# Patient Record
Sex: Female | Born: 1975
Health system: Southern US, Community
[De-identification: ages and names within clinical notes are randomized; demographics above are authoritative.]

## PROBLEM LIST (undated history)

## (undated) DIAGNOSIS — D649 Anemia, unspecified: Secondary | ICD-10-CM

## (undated) DIAGNOSIS — E119 Type 2 diabetes mellitus without complications: Secondary | ICD-10-CM

## (undated) DIAGNOSIS — N83209 Unspecified ovarian cyst, unspecified side: Secondary | ICD-10-CM

## (undated) DIAGNOSIS — Z8619 Personal history of other infectious and parasitic diseases: Secondary | ICD-10-CM

## (undated) DIAGNOSIS — T7840XA Allergy, unspecified, initial encounter: Secondary | ICD-10-CM

## (undated) DIAGNOSIS — D219 Benign neoplasm of connective and other soft tissue, unspecified: Secondary | ICD-10-CM

## (undated) DIAGNOSIS — Z800 Family history of malignant neoplasm of digestive organs: Secondary | ICD-10-CM

## (undated) DIAGNOSIS — K219 Gastro-esophageal reflux disease without esophagitis: Secondary | ICD-10-CM

## (undated) HISTORY — DX: Gastro-esophageal reflux disease without esophagitis: K21.9

## (undated) HISTORY — PX: OVARIAN CYST REMOVAL: SHX89

## (undated) HISTORY — DX: Anemia, unspecified: D64.9

## (undated) HISTORY — DX: Benign neoplasm of connective and other soft tissue, unspecified: D21.9

## (undated) HISTORY — DX: Unspecified ovarian cyst, unspecified side: N83.209

## (undated) HISTORY — PX: TUBAL LIGATION: SHX77

## (undated) HISTORY — DX: Personal history of other infectious and parasitic diseases: Z86.19

## (undated) HISTORY — DX: Family history of malignant neoplasm of digestive organs: Z80.0

## (undated) HISTORY — PX: ABDOMINAL HYSTERECTOMY: SHX81

## (undated) HISTORY — DX: Type 2 diabetes mellitus without complications: E11.9

## (undated) HISTORY — PX: BREAST BIOPSY: SHX20

## (undated) HISTORY — DX: Allergy, unspecified, initial encounter: T78.40XA

---

## 2007-06-07 ENCOUNTER — Emergency Department: Payer: Self-pay | Admitting: Emergency Medicine

## 2011-08-07 ENCOUNTER — Other Ambulatory Visit: Payer: Self-pay | Admitting: Internal Medicine

## 2011-12-18 HISTORY — PX: LAPAROSCOPIC SUPRACERVICAL HYSTERECTOMY: SUR797

## 2012-05-08 ENCOUNTER — Ambulatory Visit: Payer: Self-pay | Admitting: Obstetrics and Gynecology

## 2012-05-08 LAB — CBC
HGB: 10.1 g/dL — ABNORMAL LOW (ref 12.0–16.0)
MCHC: 30.9 g/dL — ABNORMAL LOW (ref 32.0–36.0)
Platelet: 344 10*3/uL (ref 150–440)
RBC: 4.91 10*6/uL (ref 3.80–5.20)
RDW: 18.2 % — ABNORMAL HIGH (ref 11.5–14.5)

## 2012-05-08 LAB — BASIC METABOLIC PANEL
Anion Gap: 8 (ref 7–16)
BUN: 12 mg/dL (ref 7–18)
Chloride: 104 mmol/L (ref 98–107)
Creatinine: 0.74 mg/dL (ref 0.60–1.30)
EGFR (Non-African Amer.): >60
Osmolality: 274 (ref 275–301)
Potassium: 4.1 mmol/L (ref 3.5–5.1)

## 2012-05-15 ENCOUNTER — Ambulatory Visit: Payer: Self-pay | Admitting: Obstetrics and Gynecology

## 2013-09-16 ENCOUNTER — Ambulatory Visit: Payer: Self-pay | Admitting: Physician Assistant

## 2013-09-21 ENCOUNTER — Ambulatory Visit: Payer: Self-pay | Admitting: Obstetrics and Gynecology

## 2013-09-21 LAB — CBC
HCT: 37.3 % (ref 35.0–47.0)
HGB: 12 g/dL (ref 12.0–16.0)
MCH: 22.4 pg — ABNORMAL LOW (ref 26.0–34.0)
MCHC: 32.3 g/dL (ref 32.0–36.0)
MCV: 69 fL — ABNORMAL LOW (ref 80–100)
RBC: 5.37 10*6/uL — ABNORMAL HIGH (ref 3.80–5.20)
WBC: 6.2 10*3/uL (ref 3.6–11.0)

## 2013-09-22 ENCOUNTER — Inpatient Hospital Stay: Payer: Self-pay | Admitting: Obstetrics and Gynecology

## 2013-09-22 DIAGNOSIS — N809 Endometriosis, unspecified: Secondary | ICD-10-CM

## 2013-09-22 DIAGNOSIS — K631 Perforation of intestine (nontraumatic): Secondary | ICD-10-CM

## 2013-09-23 ENCOUNTER — Encounter: Payer: Self-pay | Admitting: General Surgery

## 2013-09-23 LAB — CBC WITH DIFFERENTIAL/PLATELET
HGB: 10.2 g/dL — ABNORMAL LOW (ref 12.0–16.0)
Lymphocyte %: 22.4 %
MCH: 22.7 pg — ABNORMAL LOW (ref 26.0–34.0)
MCHC: 32.6 g/dL (ref 32.0–36.0)
Monocyte %: 9.3 %
RDW: 15.4 % — ABNORMAL HIGH (ref 11.5–14.5)
WBC: 12.7 10*3/uL — ABNORMAL HIGH (ref 3.6–11.0)

## 2014-10-05 LAB — HM PAP SMEAR: HM PAP: NORMAL

## 2014-10-30 ENCOUNTER — Emergency Department: Payer: Self-pay | Admitting: Emergency Medicine

## 2014-10-30 LAB — URINALYSIS, COMPLETE
BACTERIA: NONE SEEN
Bilirubin,UR: NEGATIVE
Glucose,UR: NEGATIVE mg/dL (ref 0–75)
Ketone: NEGATIVE
Leukocyte Esterase: NEGATIVE
Nitrite: NEGATIVE
Ph: 7 (ref 4.5–8.0)
Protein: NEGATIVE
RBC,UR: 21 /HPF (ref 0–5)
Specific Gravity: 1.014 (ref 1.003–1.030)
Squamous Epithelial: <1
WBC UR: 1 /HPF (ref 0–5)

## 2014-10-30 LAB — CBC WITH DIFFERENTIAL/PLATELET
Basophil #: 0.1 10*3/uL (ref 0.0–0.1)
Basophil %: 0.8 %
EOS PCT: 1.7 %
Eosinophil #: 0.1 10*3/uL (ref 0.0–0.7)
HCT: 37.2 % (ref 35.0–47.0)
HGB: 11.6 g/dL — ABNORMAL LOW (ref 12.0–16.0)
LYMPHS ABS: 2.6 10*3/uL (ref 1.0–3.6)
LYMPHS PCT: 38.4 %
MCH: 22.4 pg — ABNORMAL LOW (ref 26.0–34.0)
MCHC: 31.1 g/dL — ABNORMAL LOW (ref 32.0–36.0)
MCV: 72 fL — ABNORMAL LOW (ref 80–100)
Monocyte #: 0.8 x10 3/mm (ref 0.2–0.9)
Monocyte %: 12.1 %
NEUTROS ABS: 3.2 10*3/uL (ref 1.4–6.5)
NEUTROS PCT: 47 %
PLATELETS: 344 10*3/uL (ref 150–440)
RBC: 5.16 10*6/uL (ref 3.80–5.20)
RDW: 15.5 % — AB (ref 11.5–14.5)
WBC: 6.9 10*3/uL (ref 3.6–11.0)

## 2014-10-30 LAB — COMPREHENSIVE METABOLIC PANEL
ALBUMIN: 3.3 g/dL — AB (ref 3.4–5.0)
Alkaline Phosphatase: 86 U/L
Anion Gap: 6 — ABNORMAL LOW (ref 7–16)
BUN: 16 mg/dL (ref 7–18)
Bilirubin,Total: 0.2 mg/dL (ref 0.2–1.0)
CALCIUM: 8 mg/dL — AB (ref 8.5–10.1)
CHLORIDE: 107 mmol/L (ref 98–107)
CO2: 28 mmol/L (ref 21–32)
CREATININE: 1.17 mg/dL (ref 0.60–1.30)
EGFR (African American): >60
EGFR (Non-African Amer.): 54 — ABNORMAL LOW
GLUCOSE: 83 mg/dL (ref 65–99)
Osmolality: 282 (ref 275–301)
POTASSIUM: 4.5 mmol/L (ref 3.5–5.1)
SGOT(AST): 26 U/L (ref 15–37)
SGPT (ALT): 25 U/L
Sodium: 141 mmol/L (ref 136–145)
Total Protein: 6.8 g/dL (ref 6.4–8.2)

## 2015-01-19 ENCOUNTER — Ambulatory Visit: Payer: Self-pay | Admitting: Nurse Practitioner

## 2015-01-28 ENCOUNTER — Ambulatory Visit: Payer: Self-pay | Admitting: Nurse Practitioner

## 2015-02-15 ENCOUNTER — Ambulatory Visit
Admit: 2015-02-15 | Disposition: A | Discharge status: Home / Self Care | Payer: Self-pay | Attending: Nurse Practitioner | Admitting: Nurse Practitioner

## 2015-03-16 ENCOUNTER — Ambulatory Visit: Payer: Self-pay

## 2015-03-18 ENCOUNTER — Ambulatory Visit
Admit: 2015-03-18 | Disposition: A | Discharge status: Home / Self Care | Payer: Self-pay | Attending: Nurse Practitioner | Admitting: Nurse Practitioner

## 2015-03-25 ENCOUNTER — Other Ambulatory Visit: Payer: Self-pay

## 2015-03-30 ENCOUNTER — Other Ambulatory Visit: Payer: Self-pay

## 2015-03-30 VITALS — BP 124/76 | HR 67 | Ht 66.0 in | Wt 233.4 lb

## 2015-03-30 DIAGNOSIS — E119 Type 2 diabetes mellitus without complications: Secondary | ICD-10-CM | POA: Insufficient documentation

## 2015-03-30 DIAGNOSIS — R7303 Prediabetes: Secondary | ICD-10-CM | POA: Insufficient documentation

## 2015-03-30 NOTE — Patient Instructions (Addendum)
  Given Link to [employer] education packet for diabetes. 1. Plan to eat 30-45 GM (2-3) servings of carbohydrate a meal and 15 GM for snacks. 2. Plan to check blood sugar 3-4 times a week either fasting or 1 -2hrs after a meal.  Goals of 80-130 fasting and 180 or less 1-2 hours after eating. 3. Plan to go to gym 3 days a week for 45 minutes.  Goal of 150 minutes a week. 4. Plan to complete EMMI programs by 04/30/15 5. Plan to return to Link to Wellness on 06/29/15 at 9:30AM

## 2015-03-30 NOTE — Patient Outreach (Signed)
[place] [facility]) Care Management   03/30/2015  Alejandra Watkins [birth year] [id number]  Alejandra Watkins is an 39 y.o. female. Member seen for initial office visit for Link to Wellness program for self management of Type 2 diabetes  Subjective: Member states that she learned she has diabetes in November.  States that she was feeling tired and having sweats so she saw her provider who checked her hemoglobin A1C which was 6.5.  States that she has made changes in how she eats and she is no longer drinking sweet tea. States she learned a lot at the [employer].  States that her hemoglobin A1C is now down to 5.8 and she is to see her provider in June.  Objective:   Review of Systems  All other systems reviewed and are negative.   Physical Exam  Filed Vitals:   03/30/15 0949  BP: 124/76  Pulse: 67   Filed Weights   03/30/15 0949  Weight: 233 lb 6.4 oz (105.87 kg)   Current Medications:   Current Outpatient Prescriptions  Medication Sig Dispense Refill  . metFORMIN (GLUCOPHAGE) 500 MG tablet Take 500 mg by mouth daily.     No current facility-administered medications for this visit.    Functional Status:   In your present state of health, do you have any difficulty performing the following activities: 03/30/2015  Hearing? N  Vision? N  Difficulty concentrating or making decisions? N  Walking or climbing stairs? N  Dressing or bathing? N  Doing errands, shopping? N    Fall/Depression Screening:    PHQ 2/9 Scores 03/30/2015  PHQ - 2 Score 0   THN CM Care Plan        Patient Outreach from 03/30/2015 in [place] Problem One  Potential for elevated blood sugars related to dx of diabetes   Care Plan for Problem One  Active   Interventions for Problem One Long Term Goal  Instructed on CHO counting and portion control, Instructed on blood sugar goals and how to evaluate her results to identify foods that  elevate  readings,  Instructed on importance of regular exercise for glycemic control   THN Long Term Goal (31-90 days)  Member will maintain hemoglobin A1C at or below 6.5 for the next 90 days   THN Long Term Goal Start Date  03/30/15   THN CM Short Term Goal #1 (0-30 days)  Member to complete EMMI programs by 04/30/15   [facility] CM Short Term Goal #1 Start Date  03/30/15      Assessment:  Member has made improvements in diet and lifestyle changes since she was diagnosed with  Diabetes. Her hemoglobin A1C has gone from 6.5 to 5.8. She is no longer drinking sweet tea.    Plan: Plan to eat 30-45 GM (2-3) servings of carbohydrate a meal and 15 GM for snacks. Plan to check blood sugar 3-4 times a week either fasting or 1 -2hrs after a meal.  Goals of 80-130 fasting and 180 or less 1-2 hours after eating. Plan to go to gym 3 days a week for 45 minutes.  Goal of 150 minutes a week. Plan to complete EMMI programs by 04/30/15    Plan to return to Link to Wellness on 06/29/15 at [place], [place] Management [phone]

## 2015-04-08 NOTE — Op Note (Signed)
PATIENT NAME:  Alejandra, Watkins MR#:  [id number] DATE OF BIRTH:  September 26, 1976  DATE OF PROCEDURE:  09/22/2013  PREOPERATIVE DIAGNOSIS: Large complex ovarian mass, sudden onset causing severe pain.   POSTOPERATIVE DIAGNOSES: Hemorrhagic corpus luteum with solid and cystic components as well as mucinous component, incidental enterotomy, severe bowel adhesions of the bowel to the ovary side wall and to the lower pelvis.   PROCEDURES: Diagnostic laparoscopy, left partial oophorectomy, lysis of severe bowel adhesions, converted to open minilaparotomy due to severe bowel adhesions and need to run the bowel. Incidental enterotomy found at the time of bowel running.   Dr. Bary Castilla called in to assist. He repaired the enterotomy with staples and was intraoperative consult.   ESTIMATED BLOOD LOSS: Approximately 50 mL.   FINDINGS: Severe bowel adhesions to the left ovary and large left ovarian cyst. Normal right ovary.   DESCRIPTION OF PROCEDURE: The patient was taken to the operating room and placed in supine position. After adequate general endotracheal anesthesia was instilled, the patient was prepped and draped in the usual sterile fashion. A side-opening speculum was placed in the patient's vagina and the anterior lip of the cervix was grasped with a single-tooth tenaculum. The umbilicus was injected with Marcaine and incision was made. Veress needle was placed, and drop test, fluid instillation test, and fluid aspiration test showed proper placement.. A 5 mm trocar port was placed in the midline and on the left lower quadrant. Another 5 mm trocar port was placed in the right lower quadrant to hold back the bowels. The aforementioned findings were seen and sharp and blunt dissection were used to remove the bowel adhesions from the ovary.   At this time it was noted that the bowels that were adhered to the posterior cul-de-sac had some denuded serosa.   At this time, Dr. Bary Castilla was called to come assist and  run the bowel.   The trocars were removed and a minilaparotomy was made approximately 2 fingerbreadths to the symphysis and carried sharply down to the fascia. The fascia was nicked in the midline. The incision was extended in a superolateral manner. Curved Mayo scissors were used for this. Kocher clamps were placed on the rectus fascia. The rectus fascia and rectus muscles were sharply and bluntly dissected off of each other.  The underlying muscle bellies were identified and the muscle bellies were split. Peritoneum was grasped and entered. The patient was placed in Trendelenburg. Balfour was placed into the abdomen. The bowels were packed back and the bowels were run. At this time the enterotomy, which had been a serosal tear, opened on the outside of the body without any spillage. This was then repaired by Dr. Bary Castilla, and he will dictate this under separate cover.   Arista was placed in the pelvis for the raw edges. The bowels were allowed to fall back into the pelvis. The muscle bellies were approximated with a running Vicryl suture. The On-Q trocars were placed. On-Q pump was started. Catheters were placed. Dermabond was placed. The fascia was closed with 2 running Vicryl sutures, 4-0 Monocryl was used to close the skin incisions. Dermabond and benzoin were placed. The bandages were placed. Clear urine was noted in the Foley bag and the patient was taken to recovery after having tolerated the procedure well.     ____________________________ Delsa Sale, MD cck:np D: 09/25/2013 19:10:00 ET T: 09/25/2013 22:45:06 ET JOB#: [id number]  cc: Delsa Sale, MD, <Dictator> Delsa Sale MD ELECTRONICALLY SIGNED 10/01/2013 11:08

## 2015-04-08 NOTE — Op Note (Signed)
PATIENT NAME:  Alejandra Watkins, Alejandra Watkins MR#:  [id number] DATE OF BIRTH:  [birth year]  DATE OF PROCEDURE:  09/22/2013  PREOPERATIVE DIAGNOSIS: Colonic injury during laparoscopic endometrioma excision.   POSTOPERATIVE DIAGNOSIS: Colonic injury during laparoscopic endometrioma excision.   OPERATIVE PROCEDURE: Repair of sigmoid colon injury.   SURGEON: Hervey Ard, MD   ASSISTANT: Erik Obey, MD; Donzetta Matters, MD   ESTIMATED BLOOD LOSS: Minimal.   ANESTHESIA: General.   CLINICAL NOTE: This 39 year old woman had previously undergone a laparoscopic supracervical hysterectomy last year. She developed pelvic pain AND was found to have a complex mass in the left side of the pelvis. She is undergoing laparoscopic resection and there was concern for a possible colonic injury. An open Pfannenstiel incision was made and general surgical consultation requested.   The area was explored and there was found to be a small 4 mm single hole in the midportion of the transverse colon. This was clamped with a Babcock clamp while the proximal and distal bowel was examined. Down to the peritoneal reflection there was no evidence of other bowel injury. There was moderate distortion of the lateral mesentery, but no active bleeding. A remnant of the left tube and ovary was subsequently resected by Dr. Cristino Martes. The area was irrigated and after assuring no additional injuries the colonic injury was repaired with a single application of a VZ-48 stapler with 3.5 mm staples. This area was then reinforced with a running 3-0 Vicryl seromuscular stitch. The wound was fortunately on the tinea. There was a serosal tear slightly distal to this and this was repaired with a running 3-0 Vicryl. No evidence of mucosal injury at this site. The area was irrigated and good hemostasis appreciated. The remaining procedure will be dictated by Dr. Cristino Martes.  ____________________________ Robert Bellow, MD jwb:sb D: 09/22/2013 14:12:10  ET T: 09/22/2013 14:34:20 ET JOB#: [id number]  cc: Robert Bellow, MD, <Dictator> Delsa Sale, MD Dillian Feig Amedeo Kinsman MD ELECTRONICALLY SIGNED 09/23/2013 8:14

## 2015-04-10 NOTE — Op Note (Signed)
PATIENT NAME:  Alejandra Watkins, Alejandra Watkins MR#:  [id number] DATE OF BIRTH:  [birth year]  DATE OF PROCEDURE:  05/15/2012  PREOPERATIVE DIAGNOSIS: Menorrhagia, large fibroids, and back pain unresponsive to medical management.   POSTOPERATIVE DIAGNOSIS:  Menorrhagia, large fibroids, and back pain unresponsive to medical management.    PROCEDURES PERFORMED: Laparoscopic supracervical hysterectomy with bilateral salpingectomy with uterus weighing greater than 250 grams.   SURGEON: Delsa Sale, M.D.   ASSISTANT: Barnett Applebaum, M.D.   ANESTHESIA: General endotracheal anesthesia and local.   FINDINGS: Large fibroids with lateral growing into the sidewalls of the uterus obscuring the uterine artery and the ureters with good hemostasis and morcellating of the uterus.   DESCRIPTION OF PROCEDURE: The patient was taken to the Operating Room and placed in the supine position. After adequate general endotracheal anesthesia was instilled, the patient was prepped and draped in the usual sterile fashion and placed in [place]. Time out was performed. A side-opening speculum was placed in the patient's vagina, a Foley catheter was placed, and the anterior lip of the cervix was grasped with a single-tooth tenaculum. The Hulka tenaculum, single-tooth, was placed into the uterus for manipulation. The umbilicus was injected with Marcaine. An incision was made in the umbilical fold. A Veress needle was placed. Hanging drop test, fluid instillation test, and fluid aspiration test showed proper placement of the Veress needle. CO2 was placed on low flow then placed on high flow. The Veress needle was removed and the Xcel trocar was placed through the midline. The patient was placed in Trendelenburg. The aforementioned findings were seen. Attention was first turned to the right side. The round ligament was cauterized and the tube was removed. Serial bites were taken down the sides of the right uterus. Bladder flap was created with  Harmonic scalpel. In a similar fashion, the same thing was done on the left side; however, there were found to be multiple adhesions of the fibroid to the sidewall obscuring mobility and the tissue of the ureters were identified and followed down into the pelvis to be sure they were not entrapped. Serial bites were then taken down the right side of the uterus and the Harmonic scalpel was then used to cut across the uterocervical isthmic. Because of the fibroid in the lower uterine segment it was very difficult to get to the exact placement that needed to be with the uterosacral ligaments so the fibroma was removed right below the level of the uterine segment but not impinging upon the cervix (inaudible). The uterine arteries were clamped and cauterized. A bladder flap was created. The two trocar ports were then placed in the lower abdomen and found to be hemostatic The one on the lower left hand side was increased to accommodate the morcellator. The morcellator was attached to the morcellator handpiece and the uterus was morcellated. There was one fibroid that was found to have a liquified center that was identified. The tubes were also passed off. The uterus pieces were then removed. The abdomen was cleared with normal saline, copious amounts. The patient was found to have very raw pelvis due to the laterality of the fibroids. Arista was placed along the blood vessels as well as some Surgiflo with Interceed to decrease the adhesion capacity. The patient was then noted to have blue urine as indigo carmine had been given. Cystoscopy was then performed to be sure the ureters were patent. Both ureters showed flow from the  of the ureters bilaterally into mthe) in the bladder. Cystoscopy  was halted. Attention was turned to the umbilicus where a deep UR-6 suture was placed, as well as in the other two incisions. The trocar on the left was removed. Morcellator removed and the fascial closing device was used to approximate  the fascia and subcuticular sutures were placed with 4-0 Monocryl. Dermabond and Steri-Strips were placed over this. Blue urine was noted in the patient's Foley bag. The patient was very stable and was then taken back to recovery after having tolerated the procedure well. ____________________________ Delsa Sale, MD cck:slb D: 05/19/2012 00:59:42 ET T: 05/19/2012 10:12:17 ET JOB#: [id number]  cc: Delsa Sale, MD, <Dictator> Delsa Sale MD ELECTRONICALLY SIGNED 05/19/2012 13:30

## 2015-06-21 ENCOUNTER — Telehealth: Payer: Self-pay

## 2015-06-29 ENCOUNTER — Ambulatory Visit: Payer: 59

## 2015-07-20 ENCOUNTER — Other Ambulatory Visit: Payer: Self-pay

## 2015-07-20 VITALS — BP 110/62 | HR 80 | Resp 16 | Ht 66.0 in | Wt 218.2 lb

## 2015-07-20 DIAGNOSIS — E119 Type 2 diabetes mellitus without complications: Secondary | ICD-10-CM

## 2015-07-20 NOTE — Patient Instructions (Signed)
1. Plan to continue to follow low carbohydrate diet 2. Plan to check blood sugar 2-3 times a week either fasting or 1 -2hrs after a meal.  Goals of 80-110 fasting and 140 or less 1-2 hours after eating. 3. Plan to go to gym 3 days a week for 60 minutes.  Goal of 150 minutes a week. 4. Plan to complete EMMI programs by 09/19/15 5. Plan to return to Link to Wellness on 10/26/15 at 9:00AM

## 2015-07-20 NOTE — Patient Outreach (Signed)
[place] [facility]) Care Management   07/20/2015  Alejandra Watkins November 01, 1976 [id number]  Alejandra Watkins is an 39 y.o. female  Member seen for follow up office visit for Link to Wellness program for self management of Type 2 diabetes  Subjective: Member states that she has been following a very low CHO diet and she has lost 20 lbs since she was diagnosed with diabetes.  States she saw her provider in May and he told her she could check her blood sugars a few times a week now.  States she is checking 2-3 times a week.  States she has been going to the gym 2-3 times a week.    Objective:   Review of Systems  All other systems reviewed and are negative. Reviewed glucometer 14 day average- 99   Physical Exam  Today's Vitals   07/20/15 0906  BP: 110/62  Pulse: 80  Resp: 16  Height: 1.676 m (5\' 6" )  Weight: 218 lb 3.2 oz (98.975 kg)  SpO2: 97%  PainSc: 0-No pain    Current Medications:   Current Outpatient Prescriptions  Medication Sig Dispense Refill  . metFORMIN (GLUCOPHAGE) 500 MG tablet Take 500 mg by mouth daily.     No current facility-administered medications for this visit.    Functional Status:   In your present state of health, do you have any difficulty performing the following activities: 07/20/2015 03/30/2015  Hearing? N N  Vision? N N  Difficulty concentrating or making decisions? - N  Walking or climbing stairs? N N  Dressing or bathing? - N  Doing errands, shopping? N N    Fall/Depression Screening:    PHQ 2/9 Scores 07/20/2015 03/30/2015  PHQ - 2 Score 0 0   THN CM Care Plan Problem One        Patient Outreach from 07/20/2015 in [place] Problem One  Potential for elevated blood sugars related to dx of diabetes   Care Plan for Problem One  Active   THN Long Term Goal (31-90 days)  Member will maintain hemoglobin A1C at or below 6.5 for the next 90 days   THN Long Term Goal Start Date  07/20/15 Alejandra Watkins has maintained  hemoglobin A1C below 6.5 last-6.2]   Interventions for Problem One Long Term Goal  Reviewed  CHO counting and portion control, Reinforced to check blood sugars regularly, Praised for weight loss and encouraged to continue to lose weight,  Reinforced on importance of regular exercise for glycemic control   THN CM Short Term Goal #1 (0-30 days)  Member to complete EMMI programs by next visit   THN CM Short Term Goal #1 Start Date  07/20/15 [Did not complete and reassigned programs]   Interventions for Short Term Goal #1  Reinforced on how to access EMMI programs      Assessment:   Member seen for follow up office visit for Link to Wellness program for self management of Type 2 diabetes.  Member has lost 15 lbs since last visit.  She is following a very low CHO diet.  She is meeting goal of maintaining hemoglobin A1C below 6.5 with last reading of 6.2 in May.  Plan:  Plan to continue to follow low carbohydrate diet Plan to check blood sugar 2-3 times a week either fasting or 1 -2hrs after a meal.  Goals of 80-110 fasting and 140 or less 1-2 hours after eating. Plan to go to gym 3 days a week for  60 minutes.  Goal of 150 minutes a week. Plan to complete EMMI programs by 09/19/15 Plan to return to Link to Wellness on 10/26/15 Peter Garter RN, [facility] Care Management Coordinator-Link to [place] Management [phone]

## 2015-10-26 ENCOUNTER — Other Ambulatory Visit: Payer: Self-pay

## 2015-10-26 VITALS — BP 112/62 | HR 70 | Resp 14 | Ht 66.0 in | Wt 219.9 lb

## 2015-10-26 DIAGNOSIS — E119 Type 2 diabetes mellitus without complications: Secondary | ICD-10-CM

## 2015-10-26 NOTE — Patient Outreach (Signed)
[place] [facility]) Care Management   10/26/2015  SONDI DESCH [birth year] [id number]  Alejandra Watkins is an 39 y.o. female.   Member seen for follow up office visit for Link to Wellness program for self management of Type 2 diabetes  Subjective: Member states that she had been trying to watch her CHO.  States that her hemoglobin A1C was 6 when she saw her MD in July.  States that she is exercising 3-4 times a week by walking 2-3 miles.  States she had an episode where she felt that her blood sugar was low one day when she was walking.  States that she ate and she felt better.   Objective:   Review of Systems  All other systems reviewed and are negative. Member did not bring glucometer for review  Physical Exam  Today's Vitals   10/26/15 0906  BP: 112/62  Pulse: 70  Resp: 14  Height: 1.676 m (_0 )  Weight: 219 lb 14.4 oz (99.746 kg)  SpO2: 98%  PainSc: 0-No pain    Current Medications:   Current Outpatient Prescriptions  Medication Sig Dispense Refill  . metFORMIN (GLUCOPHAGE) 500 MG tablet Take 500 mg by mouth daily.     No current facility-administered medications for this visit.    Functional Status:   In your present state of health, do you have any difficulty performing the following activities: 10/26/2015 07/20/2015  Hearing? N N  Vision? N N  Difficulty concentrating or making decisions? N -  Walking or climbing stairs? N N  Dressing or bathing? N -  Doing errands, shopping? N N    Fall/Depression Screening:    PHQ 2/9 Scores 10/26/2015 07/20/2015 03/30/2015  PHQ - 2 Score 0 0 0    Assessment:   Member seen for follow up office visit for Link to Wellness program for self management of Type 2 diabetes.  Member continues to maintain diabetes self management goal of hemoglobin A1C of 6.5 or less with last reading 6.  Member is adhering to a low CHO diet and is exercising regularly.  She did have one episode of reported hypoglycemia after exercise but she did  not check her CBG at the time.  Plan:  1. Plan to continue to follow low carbohydrate diet.  Plan to eat a snack before exercising in the afternoon 2. Plan to check blood sugar 2-3 times a week either fasting or 1 -2hrs after a meal.  Goals of 80-110 fasting and 140 or less 1-2 hours after eating. 3. Plan to go to gym or walk 3 days a week for 60 minutes. Plan to do strength training twice Goal of 150 minutes a week. 4. Plan to see Dr. Meredith Staggers on 02/09/16 5. Plan to return to Link to Wellness on 03/07/16 at [place] Problem One        Most Recent Value   Care Plan Problem One  Potential for elevated blood sugars related to dx of diabetes   Role Documenting the Problem One  Care Management [place] for Problem One  Active   THN Long Term Goal (31-90 days)  Member will maintain hemoglobin A1C at or below 6.5 for the next 90 days   THN Long Term Goal Start Date  10/26/15 Maylon Peppers has maintained hemoglobin A1C below 6.5 last-6]   Interventions for Problem One Long Term Goal  Reviewed  CHO counting and portion control, Reinforced to check blood sugars regularly and  if she feels low, instructed to eat a snack before she exercises in the afternoon, Encouraged to continue to lose weight,  Reinforced on importance of regular exercise for glycemic control   THN CM Short Term Goal #1 (0-30 days)  Member to complete EMMI programs by next visit   [facility] CM Short Term Goal #1 Start Date  07/20/15 [Did not complete and reassigned programs]   [facility] CM Short Term Goal #1 Met Date  10/26/15    Peter Garter RN, [facility] Care Management Coordinator-Link to [place] Management [phone]

## 2015-10-26 NOTE — Patient Instructions (Addendum)
1. Plan to continue to follow low carbohydrate diet.  Plan to eat a snack before exercising in the afternoon 2. Plan to check blood sugar 2-3 times a week either fasting or 1 -2hrs after a meal.  Goals of 80-110 fasting and 140 or less 1-2 hours after eating. 3. Plan to go to gym or walk 3 days a week for 60 minutes. Plan to do strength training twice Goal of 150 minutes a week. 4. Plan to see Dr. Meredith Staggers on 02/09/16 5. Plan to return to Link to Wellness on 03/07/16 at 9:00AM

## 2016-02-22 ENCOUNTER — Other Ambulatory Visit: Payer: Self-pay

## 2016-02-22 VITALS — BP 112/70 | HR 73 | Resp 16 | Ht 66.0 in | Wt 229.0 lb

## 2016-02-22 DIAGNOSIS — E119 Type 2 diabetes mellitus without complications: Secondary | ICD-10-CM

## 2016-02-22 NOTE — Patient Instructions (Signed)
1. Plan to continue to watch amount of carbohydrate in meals.  2. Plan to check blood sugar weekly a week either fasting or 1 -2hrs after a meal.  Goals of 80-110 fasting and 140 or less 1 1/2-2 hours after eating. 3. Plan to go to gym or walk 3 days a week for 60 minutes. Plan to do strength training twice Goal of 150 minutes a week. 4. Plan to see Dr. Meredith Staggers on 02/29/16 5. Plan to return to Link to Wellness on 05/23/16 at 9:00AM

## 2016-02-22 NOTE — Patient Outreach (Signed)
[facility]) Care Management   02/22/2016  Alejandra Watkins 22-Mar-1976 [id number]  Alejandra Watkins is an 40 y.o. female.   Member seen for follow up office visit for Link to Wellness program for self management of Type 2 diabetes  Subjective: Member states that she gained weight over the holidays but she is now trying to watch her diet better.  States she is going to the gym 3 times a week and works out with a Clinical research associate for 50 minutes.  States she is to see her MD on 02/29/16.    Objective:   Review of Systems  All other systems reviewed and are negative.   Physical Exam  Today's Vitals   02/22/16 0913  BP: 112/70  Pulse: 73  Resp: 16  Height: 1.676 m (5\' 6" )  Weight: 229 lb (103.874 kg)  SpO2: 98%  PainSc: 0-No pain   Current Medications:   Current Outpatient Prescriptions  Medication Sig Dispense Refill  . metFORMIN (GLUCOPHAGE) 500 MG tablet Take 500 mg by mouth daily.     No current facility-administered medications for this visit.    Functional Status:   In your present state of health, do you have any difficulty performing the following activities: 02/22/2016 10/26/2015  Hearing? N N  Vision? N N  Difficulty concentrating or making decisions? N N  Walking or climbing stairs? N N  Dressing or bathing? N N  Doing errands, shopping? N N    Fall/Depression Screening:    PHQ 2/9 Scores 02/22/2016 10/26/2015 07/20/2015 03/30/2015  PHQ - 2 Score 0 0 0 0    Assessment:  Member seen for follow up office visit for Link to Wellness program for self management of Type 2 diabetes. Member continues to maintain diabetes self management goal of hemoglobin A1C of 6.5 or less with last reading 6.Member to see MD on 02/29/16.   Member has gained 10 lbs since last visit.  Reports she is now exercising and watching her diet better.  Plan:  Plan to continue to watch amount of carbohydrate in meals.  Plan to check blood sugar weekly a week either fasting or 1 -2hrs after a  meal.  Goals of 80-110 fasting and 140 or less 1 1/2-2 hours after eating. Plan to go to gym or walk 3 days a week for 60 minutes. Plan to do strength training twice Goal of 150 minutes a week. Plan to see Dr. Meredith Staggers on 02/29/16 Plan to return to Link to Wellness on 05/23/16 at [place] Problem One        Most Recent Value   Care Plan Problem One  Potential for elevated blood sugars related to dx of diabetes   Role Documenting the Problem One  Care Management [place] for Problem One  Active   [facility] Long Term Goal (31-90 days)  Member will maintain hemoglobin A1C at or below 6.5 for the next 90 days   [facility] Long Term Goal Start Date  02/22/16 [Hemoglobin A1C to be rechecked 02/29/16]   Interventions for Problem One Long Term Goal  Reviewed  CHO counting and portion control,Discussed using the plate method, Encouraged to try taking her blood sugars at least once a week fasting or after meals,  Encouraged to resume losing weight,  Reinforced on importance of regular exercise for glycemic control    Peter Garter RN, [facility] Care Management Coordinator-Link to [place] Management [phone]

## 2016-02-29 DIAGNOSIS — E119 Type 2 diabetes mellitus without complications: Secondary | ICD-10-CM | POA: Diagnosis not present

## 2016-02-29 DIAGNOSIS — Z6839 Body mass index (BMI) 39.0-39.9, adult: Secondary | ICD-10-CM | POA: Diagnosis not present

## 2016-04-19 DIAGNOSIS — Z1231 Encounter for screening mammogram for malignant neoplasm of breast: Secondary | ICD-10-CM | POA: Diagnosis not present

## 2016-04-19 DIAGNOSIS — Z01419 Encounter for gynecological examination (general) (routine) without abnormal findings: Secondary | ICD-10-CM | POA: Diagnosis not present

## 2016-04-19 DIAGNOSIS — Z800 Family history of malignant neoplasm of digestive organs: Secondary | ICD-10-CM | POA: Diagnosis not present

## 2016-04-19 DIAGNOSIS — Z1239 Encounter for other screening for malignant neoplasm of breast: Secondary | ICD-10-CM | POA: Diagnosis not present

## 2016-04-19 LAB — HM MAMMOGRAPHY

## 2016-05-23 ENCOUNTER — Other Ambulatory Visit: Payer: Self-pay

## 2016-05-23 VITALS — BP 112/72 | HR 70 | Resp 16 | Ht 66.0 in | Wt 229.4 lb

## 2016-05-23 DIAGNOSIS — E119 Type 2 diabetes mellitus without complications: Secondary | ICD-10-CM

## 2016-05-23 NOTE — Patient Instructions (Signed)
1. Plan to start following Weight Watchers plan 2. Plan to check blood sugar 2-3 a week either fasting or 1 -2hrs after a meal.  Goals of 80-110 fasting and 140 or less 1 1/2-2 hours after eating. 3. Plan to go to gym or walk 3 days a week for 60 minutes. Plan to do strength training twice a week Goal of 150 minutes a week.  Plan to get 6000 steps a day on days you do not go to the gym 4. Plan to return to Link to Wellness on 08/22/16 at 9:00AM

## 2016-05-23 NOTE — Patient Outreach (Signed)
[facility]) Care Management   05/23/2016  Alejandra Watkins Dec 16, 1976 [id number]  Alejandra Watkins is an 40 y.o. female.   Member seen for follow up office visit for Link to Wellness program for self management of Type 2 diabetes  Subjective: Member states that she saw her provider in March and her hemoglobin A1C was 5.8.  States she has been checking her blood sugars 2-3 times a week.  States that she continues to go to the gym 3 times a week for 60 minutes.  States that she has joined [employer] but has not started following the plan yet.  States she plans to start when she gets back from vacation next week.     Objective:   Review of Systems  All other systems reviewed and are negative.   Physical Exam Today's Vitals   05/23/16 0907  BP: 112/72  Pulse: 70  Resp: 16  Height: 1.676 m (5\' 6" )  Weight: 229 lb 6.4 oz (104.055 kg)  SpO2: 98%  PainSc: 0-No pain   Encounter Medications:   Outpatient Encounter Prescriptions as of 05/23/2016  Medication Sig  . Cholecalciferol (VITAMIN D3) 5000 units CAPS Take 1 capsule by mouth daily.  . metFORMIN (GLUCOPHAGE) 500 MG tablet Take 500 mg by mouth daily.  . Multiple Vitamins-Minerals (MULTIVITAMIN WOMEN) TABS Take 1 tablet by mouth daily.   No facility-administered encounter medications on file as of 05/23/2016.    Functional Status:   In your present state of health, do you have any difficulty performing the following activities: 05/23/2016 02/22/2016  Hearing? N N  Vision? N N  Difficulty concentrating or making decisions? N N  Walking or climbing stairs? N N  Dressing or bathing? N N  Doing errands, shopping? - N    Fall/Depression Screening:    PHQ 2/9 Scores 05/23/2016 02/22/2016 10/26/2015 07/20/2015 03/30/2015  PHQ - 2 Score 0 0 0 0 0    Assessment:  Member seen for follow up office visit for Link to Wellness program for self management of Type 2 diabetes. Member continues to maintain diabetes self  management goal of hemoglobin A1C of 6.5% or less with last reading 5.8%. Member has joined [employer] but has not started program yet.  Reports checking her blood sugars 2-3 times a week with readings of 83-130.  Reports exercising 3 times a week.  Member is up to date with annual eye exam and dental check ups.  Plan:  Plan to start following Weight Watchers plan Plan to check blood sugar 2-3 a week either fasting or 1 -2hrs after a meal.  Goals of 80-110 fasting and 140 or less 1 1/2-2 hours after eating. Plan to go to gym or walk 3 days a week for 60 minutes. Plan to do strength training twice a week Goal of 150 minutes a week.  Plan to get 6000 steps a day on days you do not go to the gym Plan to return to Link to Wellness on 08/22/16 at [place] Problem One        Most Recent Value   Care Plan Problem One  Potential for elevated blood sugars related to dx of diabetes   Role Documenting the Problem One  Care Management [place] for Problem One  Active   [facility]N Long Term Goal (31-90 days)  Member will maintain hemoglobin A1C at or below 6.5 for the next 90 days   [place]  Goal Start Date  05/23/16 [Continue last Hemoglobin A1C 5.8%]   Interventions for Problem One Long Term Goal  Reviewed  CHO counting and portion control,Encourged to start following Weight watchers since she has joined the program to lose weight, Reinforced to  take her blood sugars 2-3 times a week fasting or after meals, Reinforced on importance of regular exercise for glycemic control, Encouraged to try to get more steps on the days she is not exercising    Peter Garter RN, [facility] Care Management Coordinator-Link to [place] Management [phone]

## 2016-06-25 ENCOUNTER — Ambulatory Visit (INDEPENDENT_AMBULATORY_CARE_PROVIDER_SITE_OTHER): Payer: 59 | Admitting: Internal Medicine

## 2016-06-25 ENCOUNTER — Encounter: Payer: Self-pay | Admitting: Internal Medicine

## 2016-06-25 VITALS — BP 118/80 | HR 78 | Temp 98.2°F | Wt 234.0 lb

## 2016-06-25 DIAGNOSIS — E119 Type 2 diabetes mellitus without complications: Secondary | ICD-10-CM

## 2016-06-25 LAB — COMPREHENSIVE METABOLIC PANEL
ALT: 28 U/L (ref 0–35)
AST: 24 U/L (ref 0–37)
Albumin: 4.2 g/dL (ref 3.5–5.2)
Alkaline Phosphatase: 73 U/L (ref 39–117)
BUN: 12 mg/dL (ref 6–23)
CHLORIDE: 102 meq/L (ref 96–112)
CO2: 28 mEq/L (ref 19–32)
Calcium: 9.8 mg/dL (ref 8.4–10.5)
Creatinine, Ser: 0.74 mg/dL (ref 0.40–1.20)
GFR: 110.87 mL/min (ref >60.00)
GLUCOSE: 87 mg/dL (ref 70–99)
POTASSIUM: 4 meq/L (ref 3.5–5.1)
SODIUM: 135 meq/L (ref 135–145)
Total Bilirubin: 0.4 mg/dL (ref 0.2–1.2)
Total Protein: 7 g/dL (ref 6.0–8.3)

## 2016-06-25 LAB — LIPID PANEL
CHOL/HDL RATIO: 4
Cholesterol: 199 mg/dL (ref 0–200)
HDL: 45.4 mg/dL (ref >39.00)
LDL Cholesterol: 128 mg/dL — ABNORMAL HIGH (ref 0–99)
NONHDL: 153.8
Triglycerides: 131 mg/dL (ref 0.0–149.0)
VLDL: 26.2 mg/dL (ref 0.0–40.0)

## 2016-06-25 LAB — MICROALBUMIN / CREATININE URINE RATIO
CREATININE, U: 152.3 mg/dL
Microalb Creat Ratio: 0.5 mg/g (ref 0.0–30.0)
Microalb, Ur: <0.7 mg/dL (ref 0.0–1.9)

## 2016-06-25 LAB — CBC
HEMATOCRIT: 36.6 % (ref 36.0–46.0)
Hemoglobin: 11.5 g/dL — ABNORMAL LOW (ref 12.0–15.0)
MCHC: 31.5 g/dL (ref 30.0–36.0)
MCV: 71 fl — AB (ref 78.0–100.0)
Platelets: 407 10*3/uL — ABNORMAL HIGH (ref 150.0–400.0)
RBC: 5.16 Mil/uL — ABNORMAL HIGH (ref 3.87–5.11)
RDW: 16.1 % — AB (ref 11.5–15.5)
WBC: 8.7 10*3/uL (ref 4.0–10.5)

## 2016-06-25 LAB — HEMOGLOBIN A1C: Hgb A1c MFr Bld: 6.1 % (ref 4.6–6.5)

## 2016-06-25 NOTE — Patient Instructions (Signed)

## 2016-06-25 NOTE — Progress Notes (Signed)
HPI  Pt presents to the clinic today to establish care and for management of the conditions listed below. She is transferring care from [facility] in [place].  DM 2: Her last A1C was 5.8%, 03/2016. Her sugars range low 100's to 170. She takes Metformin 500 mg once daily, denies GI upset. She has had her flu vaccine. She has never had a pneumonia vaccine. Her last eye exam has been within the last year at [facility]. She checks her feet daily in the shower.  Flu: 09/2015 Tetanus: > 5 years but < 10 years Pap Smear: 04/2016 at [place]: 04/2016 at [place]: [place] Dentist: Hadley Pen   Past Medical History  Diagnosis Date  . Diabetes mellitus without complication ([place])   . History of chicken pox     Current Outpatient Prescriptions  Medication Sig Dispense Refill  . Cholecalciferol (VITAMIN D3) 5000 units CAPS Take 1 capsule by mouth daily.    . metFORMIN (GLUCOPHAGE) 500 MG tablet Take 500 mg by mouth daily.    . Multiple Vitamins-Minerals (MULTIVITAMIN WOMEN) TABS Take 1 tablet by mouth daily.     No current facility-administered medications for this visit.    No Known Allergies  Family History  Problem Relation Age of Onset  . Hypertension Mother   . Hypertension Paternal Grandmother   . Diabetes Paternal Grandmother     Social History   Social History  . Marital Status: Married    Spouse Name: N/A  . Number of Children: N/A  . Years of Education: N/A   Occupational History  . Not on file.   Social History Main Topics  . Smoking status: Never Smoker   . Smokeless tobacco: Never Used  . Alcohol Use: 0.6 oz/week    0 Standard drinks or equivalent, 1 Glasses of wine per week     Comment: occasional  . Drug Use: No  . Sexual Activity: Not on file   Other Topics Concern  . Not on file   Social History Narrative    ROS:  Constitutional: Denies fever, malaise, fatigue, headache or abrupt weight changes.   HEENT: Denies eye pain, eye redness, ear pain, ringing in the ears, wax buildup, runny nose, nasal congestion, bloody nose, or sore throat. Respiratory: Denies difficulty breathing, shortness of breath, cough or sputum production.   Cardiovascular: Denies chest pain, chest tightness, palpitations or swelling in the hands or feet.  Skin: Denies redness, rashes, lesions or ulcercations.  Neurological: Denies dizziness, difficulty with memory, difficulty with speech or problems with balance and coordination.  Psych: Denies anxiety, depression, SI/HI.  No other specific complaints in a complete review of systems (except as listed in HPI above).  PE:  BP 118/80 mmHg  Pulse 78  Temp(Src) 98.2 F (36.8 C) (Oral)  Wt 234 lb (106.142 kg)  SpO2 98% Wt Readings from Last 3 Encounters:  06/25/16 234 lb (106.142 kg)  05/23/16 229 lb 6.4 oz (104.055 kg)  02/22/16 229 lb (103.874 kg)    General: Appears her stated age, obese in NAD. Cardiovascular: Normal rate and rhythm. S1,S2 noted.  No murmur, rubs or gallops noted.  Pulmonary/Chest: Normal effort and positive vesicular breath sounds. No respiratory distress. No wheezes, rales or ronchi noted.  Neurological: Alert and oriented. Cranial nerves II-XII grossly intact. Coordination normal.  Psychiatric: Mood and affect normal. Behavior is normal. Judgment and thought content normal.    BMET    Component Value Date/Time   NA 141 10/30/2014 1545  K 4.5 10/30/2014 1545   CL 107 10/30/2014 1545   CO2 28 10/30/2014 1545   GLUCOSE 83 10/30/2014 1545   BUN 16 10/30/2014 1545   CREATININE 1.17 10/30/2014 1545   CALCIUM 8.0* 10/30/2014 1545   GFRNONAA 54* 10/30/2014 1545   GFRNONAA >60 05/08/2012 1158   GFRAA >60 10/30/2014 1545   GFRAA >60 05/08/2012 1158    Lipid Panel  No results found for: CHOL, TRIG, HDL, CHOLHDL, VLDL, LDLCALC  CBC    Component Value Date/Time   WBC 6.9 10/30/2014 1545   RBC 5.16 10/30/2014 1545   HGB 11.6*  10/30/2014 1545   HCT 37.2 10/30/2014 1545   PLT 344 10/30/2014 1545   MCV 72* 10/30/2014 1545   MCH 22.4* 10/30/2014 1545   MCHC 31.1* 10/30/2014 1545   RDW 15.5* 10/30/2014 1545   LYMPHSABS 2.6 10/30/2014 1545   MONOABS 0.8 10/30/2014 1545   EOSABS 0.1 10/30/2014 1545   BASOSABS 0.1 10/30/2014 1545    Hgb A1C No results found for: HGBA1C   Assessment and Plan:

## 2016-06-25 NOTE — Assessment & Plan Note (Signed)
Will check CBC, CMET, Lipid, A1C and microalbumin today Continue Metformin 500 mg daily Encouraged yearly eye exams Encouraged her to check her feet daily Flu shot UTD Will discuss pneumovax at next visit Encouraged her to consume a low fat, low carb diet

## 2016-06-25 NOTE — Progress Notes (Signed)
Pre visit review using our clinic review tool, if applicable. No additional management support is needed unless otherwise documented below in the visit note. 

## 2016-07-23 ENCOUNTER — Ambulatory Visit
Admission: RE | Admit: 2016-07-23 | Discharge: 2016-07-23 | Disposition: A | Discharge status: Home / Self Care | Payer: 59 | Source: Ambulatory Visit | Attending: Internal Medicine | Admitting: Internal Medicine

## 2016-07-23 ENCOUNTER — Encounter: Payer: Self-pay | Admitting: Internal Medicine

## 2016-07-23 ENCOUNTER — Ambulatory Visit (INDEPENDENT_AMBULATORY_CARE_PROVIDER_SITE_OTHER): Payer: 59 | Admitting: Internal Medicine

## 2016-07-23 ENCOUNTER — Telehealth: Payer: Self-pay | Admitting: Internal Medicine

## 2016-07-23 ENCOUNTER — Encounter: Payer: Self-pay | Admitting: Emergency Medicine

## 2016-07-23 ENCOUNTER — Emergency Department
Admission: EM | Admit: 2016-07-23 | Discharge: 2016-07-23 | Disposition: A | Discharge status: Home / Self Care | Payer: 59 | Attending: Emergency Medicine | Admitting: Emergency Medicine

## 2016-07-23 VITALS — BP 124/80 | HR 77 | Temp 98.7°F | Wt 236.0 lb

## 2016-07-23 DIAGNOSIS — Z90721 Acquired absence of ovaries, unilateral: Secondary | ICD-10-CM | POA: Insufficient documentation

## 2016-07-23 DIAGNOSIS — R102 Pelvic and perineal pain: Secondary | ICD-10-CM | POA: Diagnosis present

## 2016-07-23 DIAGNOSIS — R109 Unspecified abdominal pain: Secondary | ICD-10-CM

## 2016-07-23 DIAGNOSIS — N131 Hydronephrosis with ureteral stricture, not elsewhere classified: Secondary | ICD-10-CM

## 2016-07-23 DIAGNOSIS — R1032 Left lower quadrant pain: Secondary | ICD-10-CM | POA: Diagnosis not present

## 2016-07-23 DIAGNOSIS — Z7984 Long term (current) use of oral hypoglycemic drugs: Secondary | ICD-10-CM | POA: Insufficient documentation

## 2016-07-23 DIAGNOSIS — N83202 Unspecified ovarian cyst, left side: Secondary | ICD-10-CM | POA: Insufficient documentation

## 2016-07-23 DIAGNOSIS — N838 Other noninflammatory disorders of ovary, fallopian tube and broad ligament: Secondary | ICD-10-CM

## 2016-07-23 DIAGNOSIS — N839 Noninflammatory disorder of ovary, fallopian tube and broad ligament, unspecified: Secondary | ICD-10-CM | POA: Diagnosis not present

## 2016-07-23 DIAGNOSIS — N1330 Unspecified hydronephrosis: Secondary | ICD-10-CM | POA: Insufficient documentation

## 2016-07-23 DIAGNOSIS — E119 Type 2 diabetes mellitus without complications: Secondary | ICD-10-CM | POA: Insufficient documentation

## 2016-07-23 LAB — COMPREHENSIVE METABOLIC PANEL
ALT: 23 U/L (ref 14–54)
AST: 29 U/L (ref 15–41)
Albumin: 4.2 g/dL (ref 3.5–5.0)
Alkaline Phosphatase: 79 U/L (ref 38–126)
Anion gap: 9 (ref 5–15)
BUN: 15 mg/dL (ref 6–20)
CALCIUM: 9.1 mg/dL (ref 8.9–10.3)
CHLORIDE: 104 mmol/L (ref 101–111)
CO2: 24 mmol/L (ref 22–32)
Creatinine, Ser: 1.19 mg/dL — ABNORMAL HIGH (ref 0.44–1.00)
GFR, EST NON AFRICAN AMERICAN: 56 mL/min — AB (ref >60)
GLUCOSE: 103 mg/dL — AB (ref 65–99)
POTASSIUM: 4.2 mmol/L (ref 3.5–5.1)
Sodium: 137 mmol/L (ref 135–145)
Total Bilirubin: 0.7 mg/dL (ref 0.3–1.2)
Total Protein: 7.1 g/dL (ref 6.5–8.1)

## 2016-07-23 LAB — URINALYSIS COMPLETE WITH MICROSCOPIC (ARMC ONLY)
BACTERIA UA: NONE SEEN
Bilirubin Urine: NEGATIVE
Glucose, UA: NEGATIVE mg/dL
Leukocytes, UA: NEGATIVE
Nitrite: NEGATIVE
PH: 6 (ref 5.0–8.0)
PROTEIN: NEGATIVE mg/dL
Specific Gravity, Urine: 1.057 — ABNORMAL HIGH (ref 1.005–1.030)

## 2016-07-23 LAB — POC URINALSYSI DIPSTICK (AUTOMATED)
BILIRUBIN UA: NEGATIVE
GLUCOSE UA: NEGATIVE
KETONES UA: NEGATIVE
Leukocytes, UA: NEGATIVE
Nitrite, UA: NEGATIVE
Protein, UA: NEGATIVE
SPEC GRAV UA: 1.025
UROBILINOGEN UA: NEGATIVE
pH, UA: 7

## 2016-07-23 LAB — CBC WITH DIFFERENTIAL/PLATELET
BASOS ABS: 0.1 10*3/uL (ref 0–0.1)
Basophils Relative: 1 %
EOS PCT: 1 %
Eosinophils Absolute: 0.1 10*3/uL (ref 0–0.7)
HCT: 35.7 % (ref 35.0–47.0)
Hemoglobin: 11.9 g/dL — ABNORMAL LOW (ref 12.0–16.0)
LYMPHS PCT: 26 %
Lymphs Abs: 2.6 10*3/uL (ref 1.0–3.6)
MCH: 23.3 pg — ABNORMAL LOW (ref 26.0–34.0)
MCHC: 33.4 g/dL (ref 32.0–36.0)
MCV: 69.6 fL — AB (ref 80.0–100.0)
MONO ABS: 1.1 10*3/uL — AB (ref 0.2–0.9)
Monocytes Relative: 11 %
Neutro Abs: 6 10*3/uL (ref 1.4–6.5)
Neutrophils Relative %: 61 %
PLATELETS: 372 10*3/uL (ref 150–440)
RBC: 5.13 MIL/uL (ref 3.80–5.20)
RDW: 15.7 % — AB (ref 11.5–14.5)
WBC: 9.9 10*3/uL (ref 3.6–11.0)

## 2016-07-23 LAB — PREGNANCY, URINE: Preg Test, Ur: NEGATIVE

## 2016-07-23 MED ORDER — IOPAMIDOL (ISOVUE-300) INJECTION 61%
100.0000 mL | Freq: Once | INTRAVENOUS | Status: AC | PRN
  Administered 2016-07-23: 100 mL via INTRAVENOUS

## 2016-07-23 MED ORDER — OXYCODONE-ACETAMINOPHEN 5-325 MG PO TABS
1.0000 | ORAL_TABLET | Freq: Once | ORAL | Status: AC
  Administered 2016-07-23: 1 via ORAL
  Filled 2016-07-23: qty 1

## 2016-07-23 MED ORDER — KETOROLAC TROMETHAMINE 30 MG/ML IJ SOLN
30.0000 mg | Freq: Once | INTRAMUSCULAR | Status: AC
  Administered 2016-07-23: 30 mg via INTRAMUSCULAR

## 2016-07-23 MED ORDER — MORPHINE SULFATE (PF) 4 MG/ML IV SOLN
INTRAVENOUS | Status: AC
  Administered 2016-07-23: 4 mg
  Filled 2016-07-23: qty 1

## 2016-07-23 MED ORDER — ONDANSETRON HCL 4 MG/2ML IJ SOLN
INTRAMUSCULAR | Status: AC
  Administered 2016-07-23: 4 mg
  Filled 2016-07-23: qty 2

## 2016-07-23 MED ORDER — OXYCODONE-ACETAMINOPHEN 5-325 MG PO TABS: 1.0000 | ORAL_TABLET | Freq: Four times a day (QID) | ORAL | 0 refills | Status: DC | PRN

## 2016-07-23 NOTE — Progress Notes (Signed)
Subjective:    Patient ID: Alejandra Watkins, female    DOB: January 28, 1976, 40 y.o.   MRN: [id number]  HPI  Pt presents to the clinic today with a complaint of left lower abdominal pain x 3 days.  She describes the pain as a constant aching/throbbing at a severity of 6/10 with occasional sharp pains at a severity of 8/10.  She notes radiation of the pain to the left lower back and down to the left buttock, and reports the pain worsens when urinating or passing gas.  She denies vaginal spotting, urinary frequency or urgency, changes in urine color, or burning with urination.  She admits to chills, nausea, decreased appetite, and bloody stools due to hemorrhoids.  She denies fever, vomiting, diarrhea, or constipation.  She has tried Ibuprofen with some relief at symptom onset but reports it is no longer helping.  She reports a history of ovarian cysts, with 1 requiring surgery in 2014 and 1 that self-resolved in 2015.  She states the current symptoms feel the same as those she experienced with the previous ovarian cysts.  She has had a hysterectomy, with her cervix, right ovary, and 1inch of left ovary remaining.   Review of Systems Past Medical History:  Diagnosis Date  . Diabetes mellitus without complication ([place])   . History of chicken pox    Past Surgical History:  Procedure Laterality Date  . ABDOMINAL HYSTERECTOMY     partial  . OVARIAN CYST REMOVAL Left   . TUBAL LIGATION     Family History  Problem Relation Age of Onset  . Hypertension Mother   . Hypertension Paternal Grandmother   . Diabetes Paternal Grandmother   . Cancer Paternal Uncle     lung, colon  . Stroke Neg Hx   . Heart disease Neg Hx    No Known Allergies Current Outpatient Prescriptions on File Prior to Visit  Medication Sig Dispense Refill  . Cholecalciferol (VITAMIN D3) 5000 units CAPS Take 1 capsule by mouth daily.    . metFORMIN (GLUCOPHAGE) 500 MG tablet Take 500 mg by mouth daily.    . Multiple  Vitamins-Minerals (MULTIVITAMIN WOMEN) TABS Take 1 tablet by mouth daily.     No current facility-administered medications on file prior to visit.     Const: Pt reports chills. Denies fever. GI: Pt reports left lower abdominal pain, nausea, and decreased appetite.  She denies vomiting, diarrhea, and constipation. GU: Denies vaginal spotting, urinary frequency, urinary urgency, burning with urination, or changes in urine color.     Objective:   Physical Exam  BP 124/80   Pulse 77   Temp 98.7 F (37.1 C) (Oral)   Wt 236 lb (107 kg)   SpO2 98%   BMI 38.09 kg/m   General: Appears to be in pain, tearful, in no acute distress. Pulm: Clear to auscultation bilaterally.  No wheezes, rales, or rhonchi. CV: Regular rate and rhythm.  No murmurs, rubs, or gallops. GI: Positive bowel sounds all four quadrants.  Tympanic on percussion throughout.  Soft, tender to palpation LLQ.  No distention noted. GU: Positive CVA tenderness on left side.  Negative CVA tenderness on right side. MSK: Spine and paraspinals nontender to palpation.  Full AROM of lumbar spine, pain in left lower back with rotation to the right.       Assessment & Plan:   LLQ pain, left flank pain:  ? Ovarian cyst vs. Kidney stone Urnalysis: 3+ blood STAT CT abdomen and pelvis today 30  mg Toradol IM injection today Will follow-up based on imaging results  Webb Silversmith, NP

## 2016-07-23 NOTE — ED Notes (Signed)
AAOx3.  Skin warm and dry.  Nad.  Ambulates with easy and steady gait.

## 2016-07-23 NOTE — ED Provider Notes (Signed)
[facility] Emergency Department Provider Note   ____________________________________________   First MD Initiated Contact with Patient 07/23/16 2038     (approximate)  I have reviewed the triage vital signs and the nursing notes.   HISTORY  Chief Complaint Flank Pain   HPI Alejandra Watkins is a 40 y.o. female with a history of diabetes as well as a ovarian cyst removal to the left ovary and a partial hysterectomy in 2014 who is presenting to the emergency department today with left pelvic pain. She says the pain is a 6 out of 10 right now. She says it is cramping. Says that it is constant since yesterday. However, it has been on and off since 3 days ago. She denies any nausea or vomiting. Says that the pain worsens whenever she urinates or passes gas. Denies any radiation of the pain. Was sent here by her primary care doctor who did a CAT scan earlier today and found a 4 cm mass that appears to be coming off the left adnexa and causing obstruction of the left ureter as well as left-sided hydronephrosis.   Past Medical History:  Diagnosis Date  . Diabetes mellitus without complication ([place])   . History of chicken pox     Patient Active Problem List   Diagnosis Date Noted  . Diabetes ([place]) 03/30/2015    Past Surgical History:  Procedure Laterality Date  . ABDOMINAL HYSTERECTOMY     partial  . OVARIAN CYST REMOVAL Left   . TUBAL LIGATION      Prior to Admission medications   Medication Sig Start Date End Date Taking? Authorizing Provider  Cholecalciferol (VITAMIN D3) 5000 units CAPS Take 1 capsule by mouth daily.    Historical Provider, MD  metFORMIN (GLUCOPHAGE) 500 MG tablet Take 500 mg by mouth daily.    Historical Provider, MD  Multiple Vitamins-Minerals (MULTIVITAMIN WOMEN) TABS Take 1 tablet by mouth daily.    Historical Provider, MD    Allergies Review of patient's allergies indicates no known allergies.  Family History  Problem  Relation Age of Onset  . Hypertension Mother   . Hypertension Paternal Grandmother   . Diabetes Paternal Grandmother   . Cancer Paternal Uncle     lung, colon  . Stroke Neg Hx   . Heart disease Neg Hx     Social History Social History  Substance Use Topics  . Smoking status: Never Smoker  . Smokeless tobacco: Never Used  . Alcohol use 0.6 oz/week    1 Glasses of wine per week     Comment: occasional    Review of Systems Constitutional: No fever/chills Eyes: No visual changes. ENT: No sore throat. Cardiovascular: Denies chest pain. Respiratory: Denies shortness of breath. Gastrointestinal:  No nausea, no vomiting.  No diarrhea.  No constipation. Genitourinary:As above Musculoskeletal: Negative for back pain. Skin: Negative for rash. Neurological: Negative for headaches, focal weakness or numbness.  10-point ROS otherwise negative.  ____________________________________________   PHYSICAL EXAM:  VITAL SIGNS: ED Triage Vitals  Enc Vitals Group     BP 07/23/16 2014 136/79     Pulse Rate 07/23/16 2014 88     Resp 07/23/16 2014 18     Temp 07/23/16 2014 98.3 F (36.8 C)     Temp Source 07/23/16 2014 Oral     SpO2 07/23/16 2014 100 %     Weight 07/23/16 2014 236 lb (107 kg)     Height 07/23/16 2014 5\' 3"  (1.6 m)  Head Circumference --      Peak Flow --      Pain Score 07/23/16 2020 6     Pain Loc --      Pain Edu? --      Excl. in [place]? --     Constitutional: Alert and oriented. Well appearing and in no acute distress. Eyes: Conjunctivae are normal. PERRL. EOMI. Head: Atraumatic. Nose: No congestion/rhinnorhea. Mouth/Throat: Mucous membranes are moist.  Neck: No stridor.   Cardiovascular: Normal rate, regular rhythm. Grossly normal heart sounds.   Respiratory: Normal respiratory effort.  No retractions. Lungs CTAB. Gastrointestinal: Soft with mild to moderate left lower quadrant tenderness to palpation. No distention. No abdominal bruits. Mild left CVA  tenderness to palpation. Musculoskeletal: No lower extremity tenderness nor edema.  No joint effusions. Neurologic:  Normal speech and language. No gross focal neurologic deficits are appreciated.  Skin:  Skin is warm, dry and intact. No rash noted. Psychiatric: Mood and affect are normal. Speech and behavior are normal.  ____________________________________________   LABS (all labs ordered are listed, but only abnormal results are displayed)  Labs Reviewed  URINALYSIS COMPLETEWITH MICROSCOPIC ([place] ONLY) - Abnormal; Notable for the following:       Result Value   Color, Urine YELLOW (*)    APPearance CLEAR (*)    Ketones, ur TRACE (*)    Specific Gravity, Urine 1.057 (*)    Hgb urine dipstick 2+ (*)    Squamous Epithelial / LPF 0-5 (*)    All other components within normal limits  CBC WITH DIFFERENTIAL/PLATELET - Abnormal; Notable for the following:    Hemoglobin 11.9 (*)    MCV 69.6 (*)    MCH 23.3 (*)    RDW 15.7 (*)    Monocytes Absolute 1.1 (*)    All other components within normal limits  COMPREHENSIVE METABOLIC PANEL  PREGNANCY, URINE   ____________________________________________  EKG ____________________________________________  RADIOLOGY  CT Abdomen Pelvis W Contrast (Accession [id number]) (Order [id number])  Imaging  Date: 07/23/2016 Department: [place] CT IMAGING Released By: Harrie Foreman Authorizing: [name], NP  PACS Images   Show images for CT Abdomen Pelvis W Contrast  Study Result   CLINICAL DATA:  Left lower quadrant and left flank pain for 3 days with worsening today. Hematuria with nausea and vomiting.  EXAM: CT ABDOMEN AND PELVIS WITH CONTRAST  TECHNIQUE: Multidetector CT imaging of the abdomen and pelvis was performed using the standard protocol following bolus administration of intravenous contrast.  CONTRAST:  142mL ISOVUE-300 IOPAMIDOL (ISOVUE-300) INJECTION 61%  COMPARISON:   None.  FINDINGS: Lower chest:  Unremarkable.  Hepatobiliary: No focal abnormality within the liver parenchyma. There is no evidence for gallstones, gallbladder wall thickening, or pericholecystic fluid. No intrahepatic or extrahepatic biliary dilation.  Pancreas: No focal mass lesion. No dilatation of the main duct. No intraparenchymal cyst. No peripancreatic edema.  Spleen: No splenomegaly. No focal mass lesion.  Adrenals/Urinary Tract: No adrenal nodule or mass. Right kidney and ureter are unremarkable.  There is decreased perfusion to the left kidney with mild left perinephric edema. Mild left hydroureteronephrosis is identified down to the level of the left pelvic sidewall where a 2.9 x 3.2 cm cystic structure is identified, apparently of ovarian origin given the course of the left gonadal vasculature. Previous ultrasound exams have documented a complex cystic lesion in the left ovary back to 09/16/2013. No left renal, left ureteral, or bladder stone is evident. There is no ureteral dilatation between  the left adnexal cyst in the bladder.  Stomach/Bowel: Stomach is nondistended. No gastric wall thickening. No evidence of outlet obstruction. Duodenum is normally positioned as is the ligament of Treitz. No small bowel wall thickening. No small bowel dilatation. The terminal ileum is normal. The appendix is normal. No gross colonic mass. No colonic wall thickening. No substantial diverticular change.  Vascular/Lymphatic: No abdominal aortic aneurysm. No abdominal aortic atherosclerotic calcification. There is no gastrohepatic or hepatoduodenal ligament lymphadenopathy. No intraperitoneal or retroperitoneal lymphadenopathy. No pelvic sidewall lymphadenopathy.  Reproductive: Uterus is surgically absent. Right ovary is unremarkable. As mentioned above, there is a 2.9 x 3.2 x 2.9 cm cystic lesion in the left adnexal space, apparently of ovarian origin given the course  of the gonadal vasculature.  Other: No intraperitoneal free fluid.  Musculoskeletal: Bone windows reveal no worrisome lytic or sclerotic osseous lesions.  IMPRESSION: 1. Obstructive uropathy of the left kidney with decreased perfusion and hydroureteronephrosis to a mild/moderate degree down to a cystic left adnexal lesion. The patient is status post previous partial left oophorectomy for complex left ovarian mass on 09/22/2013. As no left-sided urinary stone disease can be identified to account for the urinary obstruction and there is no dilatation of the ureter distal to the left adnexal lesion, ureteral involvement by the left adnexal process would be a consideration. Non visualized urothelial neoplasm could also produce this appearance. Pelvic ultrasound may prove helpful to further evaluate the left adnexal lesion. These results will be called to the ordering clinician or representative by the Radiologist Assistant, and communication documented in the PACS or zVision Dashboard.   Electronically Signed   By: Misty Stanley M.D.   On: 07/23/2016 19:44     ____________________________________________   PROCEDURES  Procedure(s) performed:   Procedures  Critical Care performed:   ____________________________________________   INITIAL IMPRESSION / ASSESSMENT AND PLAN / ED COURSE  Pertinent labs & imaging results that were available during my care of the patient were reviewed by me and considered in my medical decision making (see chart for details).  ----------------------------------------- 9:33 PM on 07/23/2016 -----------------------------------------  Discussed This case with Dr. Hollice Espy of urology who says that there is no emergent need for surgeryWith the degree of hydronephrosis on the CAT scan as long as the renal function is reasonable. She says that the OB/GYN will likely take the lead on any surgical procedure.  Clinical Course    ----------------------------------------- 10:42 PM on 07/23/2016 -----------------------------------------  Discussed the case with Dr. Georgianne Fick of OB/GYN who thinks it is appropriate that the patient follow-up as an outpatient. He reviewed the imaging as well as lab work. We discussed the patient's kidney function as well as diabetes and the blood in the urine. He recommends that we discharge the patient with pain control that she follow-up in the office. The patient said that she call episodes week and I offered her appointment this Friday but she did not take it because she decided to get her primary care doctors. She says that she'll call first thing in the morning for follow-up to schedule appointment with her OB/GYN and was at a BUN. We discussed strict return precautions including any worsening or concerning symptoms especially worsening of her pain worsening urinary symptoms or fever. She is understanding of the plan and willing to comply.  ____________________________________________   FINAL CLINICAL IMPRESSION(S) / ED DIAGNOSES  Ovarian mass. Hydronephrosis.    NEW MEDICATIONS STARTED DURING THIS VISIT:  New Prescriptions   No medications on file  Note:  This document was prepared using Dragon voice recognition software and may include unintentional dictation errors.    Orbie Pyo, MD 07/23/16 [phone]

## 2016-07-23 NOTE — Patient Instructions (Signed)

## 2016-07-23 NOTE — Telephone Encounter (Signed)
[place] [facility] Patient Name: MICHAELA BOULOS DOB: January 12, 1976 Initial Comment Caller states has a hx of ovarian cysts, having very bad pain Nurse Assessment Nurse: Dimas Chyle, RN, Dellis Filbert Date/Time (Eastern Time): 07/23/2016 11:23:29 AM Confirm and document reason for call. If symptomatic, describe symptoms. You must click the next button to save text entered. ---Caller states has a hx of ovarian cysts, having very bad pain. Pain started 2-3 days ago. No fever. No vaginal bleeding. Has the patient traveled out of the country within the last 30 days? ---No Does the patient have any new or worsening symptoms? ---Yes Will a triage be completed? ---Yes Related visit to physician within the last 2 weeks? ---No Does the PT have any chronic conditions? (i.e. diabetes, asthma, etc.) ---Yes List chronic conditions. ---Diabetes type 2 Is the patient pregnant or possibly pregnant? (Ask all females between the ages of 56-55) ---No Is this a behavioral health or substance abuse call? ---No Guidelines Guideline Title Affirmed Question Affirmed Notes Abdominal Pain - Female [1] MILD-MODERATE pain AND [2] constant AND [3] present > 2 hours Final Disposition User See Physician within 4 Hours (or PCP triage) Dimas Chyle, RN, Dellis Filbert Referrals REFERRED TO PCP OFFICE Disagree/Comply: Leta Baptist

## 2016-07-23 NOTE — Telephone Encounter (Signed)
Phone call placed to the patient after receiving the critical results of CT abdomen with decreased perfusion to the L kidney. She was instructed to seek care in the emergency room however the ordering physician had already alerted her to go to the ER and she was there.

## 2016-07-23 NOTE — Telephone Encounter (Signed)
This TH note did not come to my desktop; found on portal. Pt has appt to see Avie Echevaria NP today at 3pm.

## 2016-07-23 NOTE — Addendum Note (Signed)
Addended by: Lurlean Nanny on: 07/23/2016 05:01 PM   Modules accepted: Orders

## 2016-07-23 NOTE — ED Triage Notes (Signed)
Patient ambulatory to triage with steady gait, without difficulty or distress noted; pt reports left lower quad pain x 3 days radiating into flank; st hx cystectomy on same side; seen by PCP today and CT scan performed and indicates cystic lesion blocking kidney and told to come in; toradol injection received at 4pm

## 2016-07-23 NOTE — Telephone Encounter (Signed)
Yes, I had already called her. Not sure why they called you as well but thanks for following up on it.

## 2016-07-24 NOTE — Telephone Encounter (Signed)
PLEASE NOTE: All timestamps contained within this report are represented as [place] Standard Time. CONFIDENTIALTY NOTICE: This fax transmission is intended only for the addressee. It contains information that is legally privileged, confidential or otherwise protected from use or disclosure. If you are not the intended recipient, you are strictly prohibited from reviewing, disclosing, copying using or disseminating any of this information or taking any action in reliance on or regarding this information. If you have received this fax in error, please notify us immediately by telephone so that we can arrange for its return to [place]. Phone: [phone], Toll-Free: [phone], Fax: [phone] Page: 1 of 2 Call Id: [id number] [place] [facility] Patient Name: Alejandra Watkins Gender: Female DOB: October 11, 1976 Age: 40 Y 98 M 28 D Return Phone Number: Address: City/State/Zip: [place] Client [place] Night - Client Client Site [place] Physician Alejandra Watkins - NP Contact Type Call Who Is Calling Lab Lab Name Alejandra Watkins Lab Phone Number [phone] Lab Tech Name Alejandra Watkins Lab Reference Number Chief Complaint Lab Result (Critical or Stat) Call Type Lab Send to RN Reason for Call Report lab results Initial Comment [place] Watkins with critical results Additional Comment Translation No Nurse Assessment Nurse: Alejandra Dys, RN, Alejandra Watkins Date/Time (Eastern Time): 07/23/2016 7:55:18 PM Is there an on-call provider listed? ---Yes Please list name of person reporting value (Lab Employee) and a contact number. ---Alejandra Watkins [id number] Please document the following items: Lab name Lab value (read back to lab to verify) Reference range for lab value Date and time blood was drawn Collect time of birth for bilirubin results ---Dr Tery Sanfilippo read a study and  CT shows obstructive uropathy of left kidney. Pt home [phone] Pt cell [phone] Please collect the patient contact information from the lab. (name, phone number and address) ---[place] Guidelines Guideline Title Affirmed Question Affirmed Notes Nurse Date/Time (Eastern Time) Disp. Time Alejandra Watkins Time) Disposition Final User 07/23/2016 8:04:34 PM Called On-Call Provider Brewner, RN, Alejandra Watkins 07/23/2016 8:05:04 PM Clinical Call Yes Alejandra Dys, RN, Alejandra Watkins PLEASE NOTE: All timestamps contained within this report are represented as [place] Standard Time. CONFIDENTIALTY NOTICE: This fax transmission is intended only for the addressee. It contains information that is legally privileged, confidential or otherwise protected from use or disclosure. If you are not the intended recipient, you are strictly prohibited from reviewing, disclosing, copying using or disseminating any of this information or taking any action in reliance on or regarding this information. If you have received this fax in error, please notify us immediately by telephone so that we can arrange for its return to [place]. Phone: [phone], Toll-Free: [phone], Fax: [phone] Page: 2 of 2 Call Id: [id number] Paging DoctorName Phone DateTime Result/Outcome Message Type Notes Renee Pain - MD [id number] 07/23/2016 8:04:34 PM Called On Call Provider - Reached Doctor Paged Renee Pain - MD 07/23/2016 8:04:50 PM Spoke with On Call - General Message Result Gave critical results to Dr Sharlet Salina.

## 2016-07-25 DIAGNOSIS — N949 Unspecified condition associated with female genital organs and menstrual cycle: Secondary | ICD-10-CM | POA: Diagnosis not present

## 2016-07-25 DIAGNOSIS — R102 Pelvic and perineal pain: Secondary | ICD-10-CM | POA: Diagnosis not present

## 2016-08-14 DIAGNOSIS — N949 Unspecified condition associated with female genital organs and menstrual cycle: Secondary | ICD-10-CM | POA: Diagnosis not present

## 2016-08-14 DIAGNOSIS — R102 Pelvic and perineal pain: Secondary | ICD-10-CM | POA: Diagnosis not present

## 2016-08-22 ENCOUNTER — Ambulatory Visit: Payer: 59

## 2016-08-29 ENCOUNTER — Other Ambulatory Visit: Payer: Self-pay

## 2016-08-29 DIAGNOSIS — E119 Type 2 diabetes mellitus without complications: Secondary | ICD-10-CM

## 2016-08-29 NOTE — Patient Outreach (Signed)
[facility]) Care Management   08/29/2016  VASTIE LADUKE [birth year] [id number]  Alejandra Watkins is an 40 y.o. female.   Member seen for follow up office visit for Link to Wellness program for self management of Type 2 diabetes  Subjective: Mmeber states that she saw her new provider in July and her hemoglobin A1C was 6.1%.  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  States she is walking at least 3-4 days a week. States she has been checking her blood sugars about once a week.  Objective:   Review of Systems  All other systems reviewed and are negative.   Physical Exam Today's Vitals   08/29/16 1315 08/29/16 1318  BP: 110/70   Pulse: 74   Resp: 14   SpO2: 98%   Weight: 236 lb 6.4 oz (107.2 kg)   Height: 1.626 m (5\' 4" )   PainSc: 0-No pain 0-No pain   Encounter Medications:   Outpatient Encounter Prescriptions as of 08/29/2016  Medication Sig Note  . Cholecalciferol (VITAMIN D3) 5000 units CAPS Take 1 capsule by mouth daily.   Marland Kitchen desogestrel-ethinyl estradiol (APRI,EMOQUETTE,SOLIA) 0.15-30 MG-MCG tablet Take 1 tablet by mouth daily. 08/29/2016: Received from: [facility]: Take 1 tablet by mouth daily. Take continuously; do not have a hormone free interval.  . metFORMIN (GLUCOPHAGE) 500 MG tablet Take 500 mg by mouth daily.   . Multiple Vitamins-Minerals (MULTIVITAMIN WOMEN) TABS Take 1 tablet by mouth daily.   Marland Kitchen oxyCODONE-acetaminophen (ROXICET) 5-325 MG tablet Take 1-2 tablets by mouth every 6 (six) hours as needed. (Patient not taking: Reported on 08/29/2016)    No facility-administered encounter medications on file as of 08/29/2016.     Functional Status:   In your present state of health, do you have any difficulty performing the following  activities: 08/29/2016 05/23/2016  Hearing? N N  Vision? N N  Difficulty concentrating or making decisions? N N  Walking or climbing stairs? N N  Dressing or bathing? N N  Doing errands, shopping? N -  Some recent data might be hidden    Fall/Depression Screening:    PHQ 2/9 Scores 08/29/2016 06/25/2016 05/23/2016 02/22/2016 10/26/2015 07/20/2015 03/30/2015  PHQ - 2 Score 0 0 0 0 0 0 0    Assessment:  Member seen for follow up office visit for Link to Wellness program for self management of Type 2 diabetes. Member continues to maintain diabetes self management goal of hemoglobin A1C of 6.5% or less with last reading 6.1%. Member has joined [employer] but still has not started program yet.  Reports checking her blood sugars 1-2 a week with readings of 91-125.  Reports exercising 3 times a week.  Member is due for with annual eye exam and is up to date with  dental check ups.   Plan:  Plan to start following Weight Watchers plan Plan to check blood sugar once a week either fasting or 1 -2hrs after a meal.  Goals of 80-110 fasting and 140 or less 1 1/2-2 hours after eating. Plan to go to gym or walk 3 days a week for 60 minutes. Plan to do strength training twice a week. Goal of 150 minutes a week.   Plan to  return to Link to Wellness on 11/28/16 at [place] Problem One   Flowsheet Row Most Recent Value  Care Plan Problem One  Potential for elevated blood sugars related to dx of diabetes  Role Documenting the Problem One  Care Management Coordinator  Care Plan for Problem One  Active  [facility] Long Term Goal (31-90 days)  Member will maintain hemoglobin A1C at or below 6.5 for the next 90 days  [facility] Long Term Goal Start Date  08/29/16 [Continue last Hemoglobin A1C 6.1%]  Interventions for Problem One Long Term Goal  Reviewed  CHO counting and portion control, Instructed on how stress can effect blood sugars, Reinforced to start following Weight watchers since she has joined the  program to lose weight, Reinforced to  take her blood sugars 1-2 times a week fasting or after meals,Reviewed blood sugar and hemoglobin A1C goals, Reinforced on importance of regular exercise for glycemic control, Encouraged to try to get more steps on the days she is not exercising    Peter Garter RN, [facility] Care Management Coordinator-Link to [place] Management [phone]

## 2016-08-29 NOTE — Patient Instructions (Signed)
1. Plan to start following Weight Watchers plan 2. Plan to check blood sugar once a week either fasting or 1 -2hrs after a meal.  Goals of 80-110 fasting and 140 or less 1 1/2-2 hours after eating. 3. Plan to go to gym or walk 3 days a week for 60 minutes. Plan to do strength training twice a week. Goal of 150 minutes a week.   4. Plan to return to Link to Wellness on 11/28/16 at 9:00AM

## 2016-09-17 DIAGNOSIS — N949 Unspecified condition associated with female genital organs and menstrual cycle: Secondary | ICD-10-CM | POA: Diagnosis not present

## 2016-10-05 DIAGNOSIS — H5213 Myopia, bilateral: Secondary | ICD-10-CM | POA: Diagnosis not present

## 2016-10-05 LAB — HM DIABETES EYE EXAM

## 2016-10-10 ENCOUNTER — Encounter: Payer: Self-pay | Admitting: Internal Medicine

## 2016-11-11 ENCOUNTER — Encounter: Payer: Self-pay | Admitting: Emergency Medicine

## 2016-11-11 ENCOUNTER — Ambulatory Visit
Admission: EM | Admit: 2016-11-11 | Discharge: 2016-11-11 | Disposition: A | Discharge status: Home / Self Care | Payer: 59 | Attending: Emergency Medicine | Admitting: Emergency Medicine

## 2016-11-11 DIAGNOSIS — B373 Candidiasis of vulva and vagina: Secondary | ICD-10-CM | POA: Diagnosis not present

## 2016-11-11 DIAGNOSIS — L298 Other pruritus: Secondary | ICD-10-CM | POA: Diagnosis not present

## 2016-11-11 DIAGNOSIS — N898 Other specified noninflammatory disorders of vagina: Secondary | ICD-10-CM

## 2016-11-11 DIAGNOSIS — B3731 Acute candidiasis of vulva and vagina: Secondary | ICD-10-CM

## 2016-11-11 LAB — WET PREP, GENITAL
Clue Cells Wet Prep HPF POC: NONE SEEN
Sperm: NONE SEEN
Trich, Wet Prep: NONE SEEN
Yeast Wet Prep HPF POC: NONE SEEN

## 2016-11-11 LAB — CHLAMYDIA/NGC RT PCR (ARMC ONLY)
Chlamydia Tr: NOT DETECTED
N gonorrhoeae: NOT DETECTED

## 2016-11-11 MED ORDER — TERCONAZOLE 80 MG VA SUPP: 80.0000 mg | Freq: Every day | VAGINAL | 0 refills | Status: DC

## 2016-11-11 MED ORDER — KETOCONAZOLE 2 % EX CREA: 1.0000 "application " | TOPICAL_CREAM | Freq: Every day | CUTANEOUS | 0 refills | Status: DC

## 2016-11-11 NOTE — ED Provider Notes (Signed)
CSN: [id number]     Arrival date & time 11/11/16  0810 History   None    Chief Complaint  Patient presents with  . Vaginal Itching   (Consider location/radiation/quality/duration/timing/severity/associated sxs/prior Treatment) HPI  This a 40 year old female who presents with 4 day history of vaginal itching. There's been no discharge. She's tried over-the-counter creams and contacted her primary care physician who prescribed Diflucan. She took 2 doses and still continues to have symptoms of itching. She is diabetic. She has not changed any soaps or personal items. She does wear tight leggins at times. She has no fever or chills.  Past Medical History:  Diagnosis Date  . Diabetes mellitus without complication ([place])   . History of chicken pox    Past Surgical History:  Procedure Laterality Date  . ABDOMINAL HYSTERECTOMY     partial  . OVARIAN CYST REMOVAL Left    . TUBAL LIGATION     Family History  Problem Relation Age of Onset  . Hypertension Mother   . Hypertension Paternal Grandmother   . Diabetes Paternal Grandmother   . Cancer Paternal Uncle     lung, colon  . Stroke Neg Hx   . Heart disease Neg Hx    Social History  Substance Use Topics  . Smoking status: Never Smoker  . Smokeless tobacco: Never Used  . Alcohol use 0.6 oz/week    1 Glasses of wine per week     Comment: occasional   OB History    No data available     Review of Systems  Constitutional: Positive for activity change. Negative for chills, fatigue and fever.  Genitourinary: Positive for vaginal pain.  All other systems reviewed and are negative.   Allergies  Patient has no known allergies.  Home Medications   Prior to Admission medications   Medication Sig Start Date End Date Taking? Authorizing Provider  Cholecalciferol (VITAMIN D3) 5000 units CAPS Take 1 capsule by mouth daily.    Historical Provider, MD  desogestrel-ethinyl estradiol (APRI,EMOQUETTE,SOLIA) 0.15-30 MG-MCG tablet Take 1 tablet by mouth daily. 08/14/16 08/14/17  Historical Provider, MD  ketoconazole (NIZORAL) 2 % cream Apply 1 application topically daily. 11/11/16   Lorin Picket, PA-C  metFORMIN (GLUCOPHAGE) 500 MG tablet Take 500 mg by mouth daily.    Historical Provider, MD  Multiple Vitamins-Minerals (MULTIVITAMIN WOMEN) TABS Take 1 tablet by mouth daily.    Historical Provider, MD  terconazole (TERAZOL 3) 80 MG vaginal suppository Place 1 suppository (80 mg total) vaginally at bedtime. 11/11/16   Lorin Picket, PA-C   Meds Ordered and Administered this Visit  Medications - No data to display  BP 134/79 (BP Location: Left Arm)   Pulse 77   Temp 98.1 F (36.7 C) (Oral)   Resp 16   Ht 5' 3.5" (1.613 m)   Wt 235 lb (106.6 kg)   SpO2 100%   BMI 40.98 kg/m  No data found.   Physical Exam  Constitutional: She is oriented to person, place, and time. She appears well-developed  and well-nourished. No distress.  HENT:  Head: Normocephalic and atraumatic.  Eyes: EOM are normal. Pupils are equal, round, and reactive to light.  Neck: Normal range of motion. Neck supple.  Genitourinary: Vaginal discharge found.  Genitourinary Comments: Pelvic examination was performed with Nira Conn, RN as chaperone and assisted. External genitalia is unremarkable. She does have a large amount of white creamy debris at the introitus which is from the cream that she has been applying. Speculum exam showed thick creamy substance in the vagina. The samples were taken and submitted to laboratory for analysis. Bimanual was then performed showing no cervical motion tenderness no adnexal tenderness.  Musculoskeletal: Normal range of motion.  Neurological: She is alert and oriented to person, place, and time.  Skin: Skin is warm and dry. She is not diaphoretic.  Psychiatric: She has a normal mood and affect. Her behavior is normal. Judgment and thought content normal.  Nursing note and vitals reviewed.   Urgent Care Course   Clinical Course     Procedures (including critical care time)  Labs Review Labs  Reviewed  WET PREP, GENITAL - Abnormal; Notable for the following:       Result Value   WBC, Wet Prep HPF POC FEW (*)    All other components within normal limits  CHLAMYDIA/NGC RT PCR [facility] ONLY)    Imaging Review No results found.   Visual Acuity Review  Right Eye Distance:   Left Eye Distance:   Bilateral Distance:    Right Eye Near:   Left Eye Near:    Bilateral Near:         MDM   1. Vaginal itching   2. Candidal vaginitis    New Prescriptions   KETOCONAZOLE (NIZORAL) 2 % CREAM    Apply 1 application topically daily.   TERCONAZOLE (TERAZOL 3) 80 MG VAGINAL SUPPOSITORY    Place 1 suppository (80 mg total) vaginally at bedtime.  Plan: 1. Test/x-ray results and diagnosis reviewed with patient 2. rx as per orders; risks, benefits, potential side effects  reviewed with patient 3. Recommend supportive treatment withUse of Terazol intravaginally for 3 nights in succession. He was given Nizoral cream for external use on a daily basis until the itching subsides. I told her that this is no use was recovered on the wet prep that that she may have partially divided the use infection. Spite this  believe that a additional minute with intervaginal suppositories are beneficial. She also use the external cream until itching has subsided. Not improving she should follow-up with her primary care physician. 4. F/u prn if symptoms worsen or don't improve     Lorin Picket, PA-C 11/11/16 1053

## 2016-11-11 NOTE — ED Triage Notes (Signed)
Patient c/o vaginal itching that started Wed.  Patient states that she has tried OTC cream and took one dose of Diflucan yesterday morning and another dose last night.  Patient reports that her symptoms have not improved.

## 2016-11-12 ENCOUNTER — Telehealth: Payer: Self-pay | Admitting: *Deleted

## 2016-11-12 NOTE — Telephone Encounter (Signed)
Patient returned telephone call, verified DOB, communicated negative gonorrhea and chlamydia test results. Patient confirmed understanding of test results.

## 2016-11-21 DIAGNOSIS — N760 Acute vaginitis: Secondary | ICD-10-CM | POA: Diagnosis not present

## 2016-11-21 DIAGNOSIS — N898 Other specified noninflammatory disorders of vagina: Secondary | ICD-10-CM | POA: Diagnosis not present

## 2016-11-28 ENCOUNTER — Ambulatory Visit: Payer: Self-pay

## 2016-12-12 ENCOUNTER — Encounter (INDEPENDENT_AMBULATORY_CARE_PROVIDER_SITE_OTHER): Payer: Managed Care, Other (non HMO) | Admitting: Family Medicine

## 2016-12-18 ENCOUNTER — Encounter: Payer: Self-pay | Admitting: Internal Medicine

## 2016-12-18 MED ORDER — GLUCOSE BLOOD VI STRP: 1.0000 | ORAL_STRIP | Freq: Two times a day (BID) | 5 refills | Status: DC | PRN

## 2016-12-26 ENCOUNTER — Encounter: Payer: Self-pay | Admitting: Internal Medicine

## 2016-12-26 MED ORDER — GLUCOSE BLOOD VI STRP: 1.0000 | ORAL_STRIP | Freq: Two times a day (BID) | 5 refills | Status: DC | PRN

## 2017-01-01 ENCOUNTER — Encounter (INDEPENDENT_AMBULATORY_CARE_PROVIDER_SITE_OTHER): Payer: Self-pay | Admitting: Family Medicine

## 2017-01-01 ENCOUNTER — Ambulatory Visit (INDEPENDENT_AMBULATORY_CARE_PROVIDER_SITE_OTHER): Payer: 59 | Admitting: Family Medicine

## 2017-01-01 VITALS — BP 125/76 | HR 77 | Temp 99.0°F | Resp 15 | Ht 64.0 in | Wt 230.0 lb

## 2017-01-01 DIAGNOSIS — E119 Type 2 diabetes mellitus without complications: Secondary | ICD-10-CM | POA: Diagnosis not present

## 2017-01-01 DIAGNOSIS — Z6839 Body mass index (BMI) 39.0-39.9, adult: Secondary | ICD-10-CM

## 2017-01-01 DIAGNOSIS — Z9189 Other specified personal risk factors, not elsewhere classified: Secondary | ICD-10-CM | POA: Diagnosis not present

## 2017-01-01 DIAGNOSIS — Z0289 Encounter for other administrative examinations: Secondary | ICD-10-CM

## 2017-01-01 DIAGNOSIS — Z1331 Encounter for screening for depression: Secondary | ICD-10-CM

## 2017-01-01 DIAGNOSIS — E66812 Obesity, class 2: Secondary | ICD-10-CM | POA: Insufficient documentation

## 2017-01-01 DIAGNOSIS — R0602 Shortness of breath: Secondary | ICD-10-CM

## 2017-01-01 DIAGNOSIS — E559 Vitamin D deficiency, unspecified: Secondary | ICD-10-CM | POA: Diagnosis not present

## 2017-01-01 DIAGNOSIS — Z1389 Encounter for screening for other disorder: Secondary | ICD-10-CM | POA: Diagnosis not present

## 2017-01-01 DIAGNOSIS — E669 Obesity, unspecified: Secondary | ICD-10-CM | POA: Diagnosis not present

## 2017-01-01 DIAGNOSIS — R5383 Other fatigue: Secondary | ICD-10-CM

## 2017-01-01 DIAGNOSIS — D649 Anemia, unspecified: Secondary | ICD-10-CM | POA: Diagnosis not present

## 2017-01-01 NOTE — Progress Notes (Signed)
Office: [phone]  /  Fax: [phone]   HPI:   Chief Complaint: OBESITY  Alejandra Watkins (MR# [id number]) is a 41 y.o. female who presents on 01/01/2017 for obesity evaluation and treatment. Current BMI is Body mass index is 39.48 kg/m.Marland Kitchen Alejandra Watkins has struggled with obesity for years and has been unsuccessful in either losing weight or maintaining long term weight loss. Alejandra Watkins attended our information session and states she is currently in the action stage of change and ready to dedicate time achieving and maintaining a healthier weight.  Alejandra Watkins states her family eats meals together she thinks her family will eat healthier with  her her desired weight is 175 she started gaining weight within the last 10 years her heaviest weight ever was 236 lbs. she frequently makes poor food choices she frequently eats larger portions than normal  she has binge eating behaviors   Fatigue Alejandra Watkins feels her energy is lower than it should be. This has worsened with weight gain and has not worsened recently. Alejandra Watkins admits to daytime somnolence and  admits to waking up still tired. Patient is at risk for obstructive sleep apnea. Patent has a history of symptoms of daytime fatigue, Epworth sleepiness scale and morning headache. Patient generally gets 7 or 8 hours of sleep per night, and states they generally have generally restful sleep. Snoring is present. Apneic episodes are not present. Epworth Sleepiness Score is 9  Dyspnea on exertion Alejandra Watkins notes increasing shortness of breath with exercising and seems to be worsening over time with weight gain. She notes getting out of breath sooner with activity than she used to. This has not gotten worse recently. [name] denies orthopnea.  Diabetes II Alejandra Watkins has a diagnosis of diabetes type II. Alejandra Watkins is currently on Metformin. [name] states fasting blood sugars are between 100-123, postprandial 120-130's and denies any hypoglycemic episodes. Admits to  polyphagia. She has been working on intensive lifestyle modifications including diet, exercise, and weight loss to help control her blood glucose levels.  At risk for cardiovascular disease Alejandra Watkins is at a higher than average risk for cardiovascular disease due to obesity. She currently denies any chest pain.  Vitamin D deficiency Alejandra Watkins has a diagnosis of vitamin D deficiency. She is not currently taking vit D and denies nausea, vomiting or muscle weakness. Anemia Hgb is slightly low, denies heavy menses, palpitations or lightheadedness, positive             .  Depression Screen Alejandra Watkins's Food and Mood (modified PHQ-9) score was  Depression screen PHQ 2/9 01/01/2017  Decreased Interest 1  Down, Depressed, Hopeless 1  PHQ - 2 Score 2  Altered sleeping 1  Tired, decreased energy 3  Change in appetite 1  Feeling bad or failure about yourself  0  Trouble concentrating 1  Moving slowly or fidgety/restless 0  Suicidal thoughts 0  PHQ-9 Score 8    ALLERGIES: No Known Allergies  MEDICATIONS: Current Outpatient Prescriptions on File Prior to Visit  Medication Sig Dispense Refill   Cholecalciferol (VITAMIN D3) 5000 units CAPS Take 1 capsule by mouth daily.     desogestrel-ethinyl estradiol (APRI,EMOQUETTE,SOLIA) 0.15-30 MG-MCG tablet Take 1 tablet by mouth daily.     glucose blood (BAYER CONTOUR NEXT TEST) test strip 1 each by Other route 2 (two) times daily as needed for other. Use as instructed 200 each 5   metFORMIN (GLUCOPHAGE) 500 MG tablet Take 500 mg by mouth daily.     Multiple Vitamins-Minerals (MULTIVITAMIN WOMEN) TABS Take 1 tablet  by mouth daily.     No current facility-administered medications on file prior to visit.     PAST MEDICAL HISTORY: Past Medical History:  Diagnosis Date   Acid reflux    Diabetes mellitus without complication (HCC)    History of chicken pox     PAST SURGICAL HISTORY: Past Surgical History:  Procedure Laterality Date    ABDOMINAL HYSTERECTOMY     partial   OVARIAN CYST REMOVAL Left    TUBAL LIGATION      SOCIAL HISTORY: Social History  Substance Use Topics   Smoking status: Never Smoker   Smokeless tobacco: Never Used   Alcohol use 0.6 oz/week    1 Glasses of wine per week     Comment: occasional    FAMILY HISTORY: Family History  Problem Relation Age of Onset   Hypertension Mother    Alcoholism Father    Hypertension Paternal Grandmother    Diabetes Paternal Grandmother    Cancer Paternal Uncle     lung, colon   Stroke Neg Hx    Heart disease Neg Hx     ROS: Review of Systems  Constitutional: Positive for malaise/fatigue.  Cardiovascular: Negative for chest pain.  Gastrointestinal: Positive for heartburn.  Neurological: Positive for headaches.  Endo/Heme/Allergies:       Positive Polyphagia    PHYSICAL EXAM: Blood pressure 125/76, pulse 77, temperature 99 F (37.2 C), temperature source Oral, resp. rate 15, height 5\' 4"  (1.626 m), weight 230 lb (104.3 kg), SpO2 97 %. Body mass index is 39.48 kg/m. Physical Exam  Constitutional: She is oriented to person, place, and time. She appears well-developed and well-nourished.  Cardiovascular: Normal rate.   Pulmonary/Chest: Effort normal.  Musculoskeletal: Normal range of motion. She exhibits edema (Bilateral lower extremities).  Neurological: She is oriented to person, place, and time.  Skin: Skin is warm and dry.  Psychiatric: She has a normal mood and affect. Her behavior is normal.    RECENT LABS AND TESTS: BMET    Component Value Date/Time   NA 137 07/23/2016 2117   NA 141 10/30/2014 1545   K 4.2 07/23/2016 2117   K 4.5 10/30/2014 1545   CL 104 07/23/2016 2117   CL 107 10/30/2014 1545   CO2 24 07/23/2016 2117   CO2 28 10/30/2014 1545   GLUCOSE 103 (H) 07/23/2016 2117   GLUCOSE 83 10/30/2014 1545   BUN 15 07/23/2016 2117   BUN 16 10/30/2014 1545   CREATININE 1.19 (H) 07/23/2016 2117   CREATININE 1.17  10/30/2014 1545   CALCIUM 9.1 07/23/2016 2117   CALCIUM 8.0 (L) 10/30/2014 1545   GFRNONAA 56 (L) 07/23/2016 2117   GFRNONAA 54 (L) 10/30/2014 1545   GFRNONAA >60 05/08/2012 1158   GFRAA >60 07/23/2016 2117   GFRAA >60 10/30/2014 1545   GFRAA >60 05/08/2012 1158   Lab Results  Component Value Date   HGBA1C 6.1 06/25/2016   No results found for: INSULIN CBC    Component Value Date/Time   WBC 9.9 07/23/2016 2117   RBC 5.13 07/23/2016 2117   HGB 11.9 (L) 07/23/2016 2117   HGB 11.6 (L) 10/30/2014 1545   HCT 35.7 07/23/2016 2117   HCT 37.2 10/30/2014 1545   PLT 372 07/23/2016 2117   PLT 344 10/30/2014 1545   MCV 69.6 (L) 07/23/2016 2117   MCV 72 (L) 10/30/2014 1545   MCH 23.3 (L) 07/23/2016 2117   MCHC 33.4 07/23/2016 2117   RDW 15.7 (H) 07/23/2016 2117   RDW  15.5 (H) 10/30/2014 1545   LYMPHSABS 2.6 07/23/2016 2117   LYMPHSABS 2.6 10/30/2014 1545   MONOABS 1.1 (H) 07/23/2016 2117   MONOABS 0.8 10/30/2014 1545   EOSABS 0.1 07/23/2016 2117   EOSABS 0.1 10/30/2014 1545   BASOSABS 0.1 07/23/2016 2117   BASOSABS 0.1 10/30/2014 1545   Iron/TIBC/Ferritin/ %Sat No results found for: IRON, TIBC, FERRITIN, IRONPCTSAT Lipid Panel     Component Value Date/Time   CHOL 199 06/25/2016 1131   TRIG 131.0 06/25/2016 1131   HDL 45.40 06/25/2016 1131   CHOLHDL 4 06/25/2016 1131   VLDL 26.2 06/25/2016 1131   LDLCALC 128 (H) 06/25/2016 1131   Hepatic Function Panel     Component Value Date/Time   PROT 7.1 07/23/2016 2117   PROT 6.8 10/30/2014 1545   ALBUMIN 4.2 07/23/2016 2117   ALBUMIN 3.3 (L) 10/30/2014 1545   AST 29 07/23/2016 2117   AST 26 10/30/2014 1545   ALT 23 07/23/2016 2117   ALT 25 10/30/2014 1545   ALKPHOS 79 07/23/2016 2117   ALKPHOS 86 10/30/2014 1545   BILITOT 0.7 07/23/2016 2117   BILITOT 0.2 10/30/2014 1545   No results found for: TSH  ECG  shows NSR with a rate of 80 BPM  INDIRECT CALORIMETER done today shows a VO2 of 241 and a REE of  1678.    ASSESSMENT AND PLAN: Other fatigue - Plan: EKG 12-Lead, Anemia panel, Microalbumin / creatinine urine ratio, T3, T4, free, TSH, Lipid Panel With LDL/HDL Ratio, Vitamin B12, Folate, CBC With Differential, Comprehensive metabolic panel  Shortness of breath - Plan: Anemia panel  Type 2 diabetes mellitus without complication, without long-term current use of insulin (HCC) - Plan: Hemoglobin A1c, Insulin, random  Anemia, unspecified type - Plan: Anemia panel  Vitamin D deficiency - Plan: VITAMIN D 25 Hydroxy (Vit-D Deficiency, Fractures)  Depression screening  At risk for heart disease  Class 2 obesity without serious comorbidity with body mass index (BMI) of 39.0 to 39.9 in adult, unspecified obesity type  PLAN:  Fatigue Alejandra Watkins was informed that her fatigue may be related to obesity, depression or many other causes. Labs will be ordered, and in the meanwhile Monasia has agreed to work on diet, exercise and weight loss to help with fatigue. Proper sleep hygiene was discussed including the need for 7-8 hours of quality sleep each night. A sleep study was not ordered based on symptoms and Epworth score.  Dyspnea on exertion Alejandra Watkins's shortness of breath appears to be obesity related and exercise induced. She has agreed to work on weight loss and gradually increase exercise to treat her exercise induced shortness of breath. If Claudina follows our instructions and loses weight without improvement of her shortness of breath, we will plan to refer to pulmonology. We will monitor this condition regularly. Alejandra Watkins agrees to this plan.  Diabetes II Alejandra Watkins has been given extensive diabetes education by myself today including ideal fasting and post-prandial blood glucose readings, individual ideal HgA1c goals  and hypoglycemia prevention. We discussed the importance of good blood sugar control to decrease the likelihood of diabetic complications such as nephropathy, neuropathy, limb loss,  blindness, coronary artery disease, and death. We discussed the importance of intensive lifestyle modification including diet, exercise and weight loss as the first line treatment for diabetes. Alejandra Watkins agrees to continue to take Metformin and will check labs and will follow up at the agreed upon time. Diabetes diet given.  Cardiovascular risk counselling Alejandra Watkins was given extended (at least 30 minutes)  coronary artery disease prevention counseling today. She is 41 y.o. female and has risk factors for heart disease including obesity. We discussed intensive lifestyle modifications today with an emphasis on specific weight loss instructions and strategies. Pt was also informed of the importance of increasing exercise and decreasing saturated fats to help prevent heart disease.  Vitamin D Deficiency Alejandra Watkins was informed that low vitamin D levels contributes to fatigue and are associated with obesity, breast, and colon cancer. She agrees to continue to take prescription Vit D @5 ,000 IU every day and will check labs and follow up for routine testing of vitamin D, at least 2-3 times per year. She was informed of the risk of over-replacement of vitamin D and agrees to not increase her dose unless he discusses this with [place] first.  Anemia Will check labs and follow, Alejandra Watkins was given a high iron diet today.  Depression Screen Alejandra Watkins had a positive depression screening. Depression is commonly associated with obesity and often results in emotional eating behaviors. We will monitor this closely and work on CBT to help improve the non-hunger eating patterns. Referral to Psychology may be required if no improvement is seen as she continues in our clinic.  Obesity Alejandra Watkins is currently in the action stage of change and her goal is to continue with weight loss efforts She has agreed to follow the Category 3 plan Alejandra Watkins has been instructed to work up to a goal of 150 minutes of combined cardio and strengthening  exercise per week for weight loss and overall health benefits. We discussed the following Behavioral Modification Stratagies today: increasing lean protein intake, decreasing simple carbohydrates , decrease eating out and dealing with family or coworker sabotage, no skipping meals.  Alejandra Watkins has agreed to follow up with our clinic in 2 weeks. She was informed of the importance of frequent follow up visits to maximize her success with intensive lifestyle modifications for her multiple health conditions. She was informed we would discuss her lab results at her next visit unless there is a critical issue that needs to be addressed sooner. Alejandra Watkins agreed to keep her next visit at the agreed upon time to discuss these results.  I, Doreene Nest, am acting as scribe for Dennard Nip, MD  I have reviewed the above documentation for accuracy and completeness, and I agree with the above. -Dennard Nip, MD

## 2017-01-02 LAB — MICROALBUMIN / CREATININE URINE RATIO
CREATININE, UR: 163.5 mg/dL
Microalb/Creat Ratio: 4.2 mg/g creat (ref 0.0–30.0)
Microalbumin, Urine: 6.8 ug/mL

## 2017-01-07 ENCOUNTER — Other Ambulatory Visit (INDEPENDENT_AMBULATORY_CARE_PROVIDER_SITE_OTHER): Payer: Self-pay

## 2017-01-07 ENCOUNTER — Other Ambulatory Visit (INDEPENDENT_AMBULATORY_CARE_PROVIDER_SITE_OTHER): Payer: Self-pay | Admitting: Family Medicine

## 2017-01-07 DIAGNOSIS — E119 Type 2 diabetes mellitus without complications: Secondary | ICD-10-CM | POA: Diagnosis not present

## 2017-01-07 DIAGNOSIS — E559 Vitamin D deficiency, unspecified: Secondary | ICD-10-CM | POA: Diagnosis not present

## 2017-01-07 DIAGNOSIS — R5383 Other fatigue: Secondary | ICD-10-CM | POA: Diagnosis not present

## 2017-01-07 DIAGNOSIS — D649 Anemia, unspecified: Secondary | ICD-10-CM | POA: Diagnosis not present

## 2017-01-08 LAB — LIPID PANEL WITH LDL/HDL RATIO
CHOLESTEROL TOTAL: 219 mg/dL — AB (ref 100–199)
HDL: 53 mg/dL (ref >39)
LDL Calculated: 141 mg/dL — ABNORMAL HIGH (ref 0–99)
LDL/HDL RATIO: 2.7 ratio (ref 0.0–3.2)
TRIGLYCERIDES: 124 mg/dL (ref 0–149)
VLDL Cholesterol Cal: 25 mg/dL (ref 5–40)

## 2017-01-08 LAB — CBC WITH DIFFERENTIAL
BASOS ABS: 0 10*3/uL (ref 0.0–0.2)
Basos: 0 %
EOS (ABSOLUTE): 0.2 10*3/uL (ref 0.0–0.4)
Eos: 3 %
HEMOGLOBIN: 11.6 g/dL (ref 11.1–15.9)
Hematocrit: 36.6 % (ref 34.0–46.6)
IMMATURE GRANS (ABS): 0 10*3/uL (ref 0.0–0.1)
IMMATURE GRANULOCYTES: 0 %
LYMPHS: 51 %
Lymphocytes Absolute: 3 10*3/uL (ref 0.7–3.1)
MCH: 21.8 pg — ABNORMAL LOW (ref 26.6–33.0)
MCHC: 31.7 g/dL (ref 31.5–35.7)
MCV: 69 fL — ABNORMAL LOW (ref 79–97)
MONOCYTES: 7 %
Monocytes Absolute: 0.4 10*3/uL (ref 0.1–0.9)
NEUTROS PCT: 39 %
Neutrophils Absolute: 2.3 10*3/uL (ref 1.4–7.0)
RBC: 5.31 x10E6/uL — AB (ref 3.77–5.28)
RDW: 17.6 % — ABNORMAL HIGH (ref 12.3–15.4)
WBC: 5.9 10*3/uL (ref 3.4–10.8)

## 2017-01-08 LAB — COMPREHENSIVE METABOLIC PANEL
ALBUMIN: 4 g/dL (ref 3.5–5.5)
ALT: 18 IU/L (ref 0–32)
AST: 18 IU/L (ref 0–40)
Albumin/Globulin Ratio: 1.4 (ref 1.2–2.2)
Alkaline Phosphatase: 68 IU/L (ref 39–117)
BILIRUBIN TOTAL: 0.3 mg/dL (ref 0.0–1.2)
BUN / CREAT RATIO: 15 (ref 9–23)
BUN: 12 mg/dL (ref 6–24)
CHLORIDE: 102 mmol/L (ref 96–106)
CO2: 23 mmol/L (ref 18–29)
CREATININE: 0.79 mg/dL (ref 0.57–1.00)
Calcium: 9.5 mg/dL (ref 8.7–10.2)
GFR calc non Af Amer: 93 mL/min/{1.73_m2} (ref >59)
GFR, EST AFRICAN AMERICAN: 107 mL/min/{1.73_m2} (ref >59)
GLUCOSE: 94 mg/dL (ref 65–99)
Globulin, Total: 2.9 g/dL (ref 1.5–4.5)
Potassium: 4.5 mmol/L (ref 3.5–5.2)
Sodium: 141 mmol/L (ref 134–144)
TOTAL PROTEIN: 6.9 g/dL (ref 6.0–8.5)

## 2017-01-08 LAB — FOLATE: Folate: 16 ng/mL (ref >3.0)

## 2017-01-08 LAB — HEMOGLOBIN A1C
ESTIMATED AVERAGE GLUCOSE: 126 mg/dL
HEMOGLOBIN A1C: 6 % — AB (ref 4.8–5.6)

## 2017-01-08 LAB — T4, FREE: FREE T4: 1.22 ng/dL (ref 0.82–1.77)

## 2017-01-08 LAB — T3: T3, Total: 169 ng/dL (ref 71–180)

## 2017-01-08 LAB — TSH: TSH: 1.59 u[IU]/mL (ref 0.450–4.500)

## 2017-01-08 LAB — VITAMIN B12: Vitamin B-12: 623 pg/mL (ref 232–1245)

## 2017-01-08 LAB — INSULIN, RANDOM: INSULIN: 24 u[IU]/mL (ref 2.6–24.9)

## 2017-01-08 LAB — VITAMIN D 25 HYDROXY (VIT D DEFICIENCY, FRACTURES): VIT D 25 HYDROXY: 59.1 ng/mL (ref 30.0–100.0)

## 2017-01-16 ENCOUNTER — Ambulatory Visit (INDEPENDENT_AMBULATORY_CARE_PROVIDER_SITE_OTHER): Payer: 59 | Admitting: Family Medicine

## 2017-01-16 VITALS — BP 118/76 | HR 78 | Temp 98.7°F | Ht 64.0 in | Wt 228.0 lb

## 2017-01-16 DIAGNOSIS — E785 Hyperlipidemia, unspecified: Secondary | ICD-10-CM

## 2017-01-16 DIAGNOSIS — E669 Obesity, unspecified: Secondary | ICD-10-CM

## 2017-01-16 DIAGNOSIS — Z6839 Body mass index (BMI) 39.0-39.9, adult: Secondary | ICD-10-CM

## 2017-01-16 DIAGNOSIS — Z9189 Other specified personal risk factors, not elsewhere classified: Secondary | ICD-10-CM | POA: Diagnosis not present

## 2017-01-16 DIAGNOSIS — R7303 Prediabetes: Secondary | ICD-10-CM | POA: Diagnosis not present

## 2017-01-16 NOTE — Progress Notes (Signed)
Office: [phone]  /  Fax: [phone]   HPI:   Chief Complaint: OBESITY Alejandra Watkins is here to discuss her progress with her obesity treatment plan. She is following her eating plan approximately 85 % of the time and states she is exercising 0 minutes 0 times per week. [name] continues to do well with weight loss.  [name] is currently struggling with getting bored with plan but hunger was controlled. Her weight is 228 lb (103.4 kg) today and has had a weight loss of 2 pounds over a period of 2 weeks since her last visit. She has lost 2 lbs since starting treatment with [place].  Pre-Diabetes [name] has a diagnosis of prediabetes based on her elevated HgA1c and was informed this puts her at greater risk of developing diabetes. She is taking metformin currently but A1c is still 6.0 and her fasting glucose is elevated. She continues to work on diet and exercise to decrease risk of diabetes. She denies nausea or hypoglycemia. She did well with her lower sugar diet and weight loss efforts.  At risk for diabetes [name] is at higher than average risk for developing diabetes due to her obesity. She currently denies polyuria or polydipsia.  Hyperlipidemia Novel has hyperlipidemia, her LDL is over 100, She has been trying to improve her cholesterol levels with intensive lifestyle modification including a low saturated fat diet, exercise and weight loss. She has a positive family history of coronary artery disease. She denies any chest pain, claudication or myalgias.   Wt Readings from Last 500 Encounters:  01/16/17 228 lb (103.4 kg)  01/01/17 230 lb (104.3 kg)  11/11/16 235 lb (106.6 kg)  08/29/16 236 lb 6.4 oz (107.2 kg)  07/23/16 236 lb (107 kg)  07/23/16 236 lb (107 kg)  06/25/16 234 lb (106.1 kg)  05/23/16 229 lb 6.4 oz (104.1 kg)  02/22/16 229 lb (103.9 kg)  10/26/15 219 lb 14.4 oz (99.7 kg)  07/20/15 218 lb 3.2 oz (99 kg)  03/30/15 233 lb 6.4 oz (105.9 kg)     ALLERGIES: No  Known Allergies  MEDICATIONS: Current Outpatient Prescriptions on File Prior to Visit  Medication Sig Dispense Refill  . Cholecalciferol (VITAMIN D3) 5000 units CAPS Take 1 capsule by mouth daily.    Marland Kitchen desogestrel-ethinyl estradiol (APRI,EMOQUETTE,SOLIA) 0.15-30 MG-MCG tablet Take 1 tablet by mouth daily.    Marland Kitchen glucose blood (BAYER CONTOUR NEXT TEST) test strip 1 each by Other route 2 (two) times daily as needed for other. Use as instructed 200 each 5  . metFORMIN (GLUCOPHAGE) 500 MG tablet Take 500 mg by mouth daily.    . Multiple Vitamins-Minerals (MULTIVITAMIN WOMEN) TABS Take 1 tablet by mouth daily.     No current facility-administered medications on file prior to visit.     PAST MEDICAL HISTORY: Past Medical History:  Diagnosis Date  . Acid reflux   . Diabetes mellitus without complication ([place])   . History of chicken pox     PAST SURGICAL HISTORY: Past Surgical History:  Procedure Laterality Date  . ABDOMINAL HYSTERECTOMY     partial  . OVARIAN CYST REMOVAL Left   . TUBAL LIGATION      SOCIAL HISTORY: Social History  Substance Use Topics  . Smoking status: Never Smoker  . Smokeless tobacco: Never Used  . Alcohol use 0.6 oz/week    1 Glasses of wine per week     Comment: occasional    FAMILY HISTORY: Family History  Problem Relation Age of Onset  .  Hypertension Mother   . Alcoholism Father   . Hypertension Paternal Grandmother   . Diabetes Paternal Grandmother   . Cancer Paternal Uncle     lung, colon  . Stroke Neg Hx   . Heart disease Neg Hx     ROS: Review of Systems  Constitutional: Positive for weight loss.  Cardiovascular: Negative for chest pain and claudication.  Gastrointestinal: Negative for nausea.  Genitourinary: Negative for frequency.  Musculoskeletal: Negative for myalgias.  Endo/Heme/Allergies: Negative for polydipsia.       Negative hypoglycemia    PHYSICAL EXAM: Blood pressure 118/76, pulse 78, temperature 98.7 F (37.1 C),  temperature source Oral, height 5\' 4"  (1.626 m), weight 228 lb (103.4 kg), SpO2 100 %. Body mass index is 39.14 kg/m. Physical Exam  Constitutional: She is oriented to person, place, and time. She appears well-developed and well-nourished.  Cardiovascular: Normal rate.   Pulmonary/Chest: Effort normal.  Musculoskeletal: Normal range of motion. She exhibits edema (trace edeema bilateral lower extremeties).  Neurological: She is oriented to person, place, and time.  Skin: Skin is warm and dry.  Psychiatric: She has a normal mood and affect. Her behavior is normal.  Vitals reviewed.   RECENT LABS AND TESTS: BMET    Component Value Date/Time   NA 141 01/07/2017 0926   NA 141 10/30/2014 1545   K 4.5 01/07/2017 0926   K 4.5 10/30/2014 1545   CL 102 01/07/2017 0926   CL 107 10/30/2014 1545   CO2 23 01/07/2017 0926   CO2 28 10/30/2014 1545   GLUCOSE 94 01/07/2017 0926   GLUCOSE 103 (H) 07/23/2016 2117   GLUCOSE 83 10/30/2014 1545   BUN 12 01/07/2017 0926   BUN 16 10/30/2014 1545   CREATININE 0.79 01/07/2017 0926   CREATININE 1.17 10/30/2014 1545   CALCIUM 9.5 01/07/2017 0926   CALCIUM 8.0 (L) 10/30/2014 1545   GFRNONAA 93 01/07/2017 0926   GFRNONAA 54 (L) 10/30/2014 1545   GFRNONAA >60 05/08/2012 1158   GFRAA 107 01/07/2017 0926   GFRAA >60 10/30/2014 1545   GFRAA >60 05/08/2012 1158   Lab Results  Component Value Date   HGBA1C 6.0 (H) 01/07/2017   HGBA1C 6.1 06/25/2016   Lab Results  Component Value Date   INSULIN 24.0 01/07/2017   CBC    Component Value Date/Time   WBC 5.8 01/07/2017 1518   WBC 9.9 07/23/2016 2117   RBC 5.29 (H) 01/07/2017 1518   RBC 5.13 07/23/2016 2117   HGB 11.9 (L) 07/23/2016 2117   HGB 11.6 (L) 10/30/2014 1545   HCT 37.2 01/07/2017 1518   PLT 409 (H) 01/07/2017 1518   MCV 70 (L) 01/07/2017 1518   MCV 72 (L) 10/30/2014 1545   MCH 20.6 (L) 01/07/2017 1518   MCH 23.3 (L) 07/23/2016 2117   MCHC 29.3 (L) 01/07/2017 1518   MCHC 33.4  07/23/2016 2117   RDW 17.5 (H) 01/07/2017 1518   RDW 15.5 (H) 10/30/2014 1545   LYMPHSABS 2.9 01/07/2017 1518   LYMPHSABS 2.6 10/30/2014 1545   MONOABS 1.1 (H) 07/23/2016 2117   MONOABS 0.8 10/30/2014 1545   EOSABS 0.2 01/07/2017 1518   EOSABS 0.1 10/30/2014 1545   BASOSABS 0.0 01/07/2017 1518   BASOSABS 0.1 10/30/2014 1545   Iron/TIBC/Ferritin/ %Sat    Component Value Date/Time   IRON WILL FOLLOW 01/07/2017 1518   TIBC WILL FOLLOW 01/07/2017 1518   FERRITIN WILL FOLLOW 01/07/2017 1518   IRONPCTSAT WILL FOLLOW 01/07/2017 1518   Lipid Panel  Component Value Date/Time   CHOL 219 (H) 01/07/2017 0926   TRIG 124 01/07/2017 0926   HDL 53 01/07/2017 0926   CHOLHDL 4 06/25/2016 1131   VLDL 26.2 06/25/2016 1131   LDLCALC 141 (H) 01/07/2017 0926   Hepatic Function Panel     Component Value Date/Time   PROT 6.9 01/07/2017 0926   PROT 6.8 10/30/2014 1545   ALBUMIN 4.0 01/07/2017 0926   ALBUMIN 3.3 (L) 10/30/2014 1545   AST 18 01/07/2017 0926   AST 26 10/30/2014 1545   ALT 18 01/07/2017 0926   ALT 25 10/30/2014 1545   ALKPHOS 68 01/07/2017 0926   ALKPHOS 86 10/30/2014 1545   BILITOT 0.3 01/07/2017 0926   BILITOT 0.2 10/30/2014 1545      Component Value Date/Time   TSH 1.590 01/07/2017 0926    ASSESSMENT AND PLAN: Class 2 obesity without serious comorbidity with body mass index (BMI) of 39.0 to 39.9 in adult, unspecified obesity type  Hyperlipidemia, unspecified hyperlipidemia type  Prediabetes  At risk for diabetes mellitus  PLAN:  Pre-Diabetes Krystan will continue to work on weight loss, exercise, and decreasing simple carbohydrates in her diet to help decrease the risk of diabetes. We dicussed metformin including benefits and risks. She was informed that eating too many simple carbohydrates or too many calories at one sitting increases the likelihood of GI side effects. Marisa agreed to continue taking metformin, no refills needed today. We will re-check labs  in 3 months and she will follow up with [place] as directed to monitor her progress.  Diabetes risk counselling Marshala was given extended (at least 30 minutes) diabetes prevention counseling today. She is 41 y.o. female and has risk factors for diabetes including obesity. We discussed intensive lifestyle modifications today with an emphasis on weight loss as well as increasing exercise and decreasing simple carbohydrates in her diet.  Hyperlipidemia Kanylah was informed of the American Heart Association Guidelines emphasizing intensive lifestyle modifications as the first line treatment for hyperlipidemia. We discussed many lifestyle modifications today in depth, and Danele will continue to work on decreasing saturated fats such as fatty red meat, butter and many fried foods. She will also increase vegetables and lean protein in her diet and continue to work on exercise and weight loss efforts. We will re-check labs in 3 months and follow.   Obesity Ikeisha is currently in the action stage of change. As such, her goal is to continue with weight loss efforts She has agreed to keep a food journal with 250-400 calories and 20+ grams of protein daily and follow the Category 3 plan Starlena has been instructed to work up to a goal of 150 minutes of combined cardio and strengthening exercise per week for weight loss and overall health benefits. We discussed the following Behavioral Modification Stratagies today: increasing lean protein intake, decreasing simple carbohydrates , increasing vegetables, increasing lower sugar fruits and work on meal planning and easy cooking plans  Conley has agreed to follow up with our clinic in 2 weeks. She was informed of the importance of frequent follow up visits to maximize her success with intensive lifestyle modifications for her multiple health conditions.  I, Doreene Nest, am acting as scribe for Dennard Nip, MD  I have reviewed the above documentation for  accuracy and completeness, and I agree with the above. -Dennard Nip, MD

## 2017-01-17 LAB — ANEMIA PROFILE B
BASOS: 1 %
BASOS: 1 %
Basophils Absolute: 0 10*3/uL (ref 0.0–0.2)
Basophils Absolute: 0 10*3/uL (ref 0.0–0.2)
EOS (ABSOLUTE): 0.2 10*3/uL (ref 0.0–0.4)
EOS (ABSOLUTE): 0.2 10*3/uL (ref 0.0–0.4)
EOS: 3 %
Eos: 3 %
FERRITIN: 305 ng/mL — AB (ref 15–150)
Folate: 18.5 ng/mL (ref >3.0)
HEMATOCRIT: 36.8 % (ref 34.0–46.6)
HEMATOCRIT: 37.2 % (ref 34.0–46.6)
HEMOGLOBIN: 10.9 g/dL — AB (ref 11.1–15.9)
Hemoglobin: 11.5 g/dL (ref 11.1–15.9)
IMMATURE GRANS (ABS): 0 10*3/uL (ref 0.0–0.1)
Immature Grans (Abs): 0 10*3/uL (ref 0.0–0.1)
Immature Granulocytes: 0 %
Immature Granulocytes: 0 %
Iron Saturation: 28 % (ref 15–55)
Iron: 108 ug/dL (ref 27–159)
LYMPHS: 48 %
Lymphocytes Absolute: 3 10*3/uL (ref 0.7–3.1)
Lymphocytes Absolute: 2.9 10*3/uL (ref 0.7–3.1)
Lymphs: 49 %
MCH: 22.1 pg — AB (ref 26.6–33.0)
MCH: 20.6 pg — ABNORMAL LOW (ref 26.6–33.0)
MCHC: 31.3 g/dL — ABNORMAL LOW (ref 31.5–35.7)
MCHC: 29.3 g/dL — AB (ref 31.5–35.7)
MCV: 71 fL — AB (ref 79–97)
MCV: 70 fL — AB (ref 79–97)
MONOCYTES: 9 %
MONOCYTES: 9 %
MONOS ABS: 0.6 10*3/uL (ref 0.1–0.9)
Monocytes Absolute: 0.5 10*3/uL (ref 0.1–0.9)
NEUTROS ABS: 2.3 10*3/uL (ref 1.4–7.0)
Neutrophils Absolute: 2.3 10*3/uL (ref 1.4–7.0)
Neutrophils: 38 %
Neutrophils: 39 %
PLATELETS: 415 10*3/uL — AB (ref 150–379)
Platelets: 409 10*3/uL — ABNORMAL HIGH (ref 150–379)
RBC: 5.21 x10E6/uL (ref 3.77–5.28)
RBC: 5.29 x10E6/uL — ABNORMAL HIGH (ref 3.77–5.28)
RDW: 16.3 % — AB (ref 12.3–15.4)
RDW: 17.5 % — AB (ref 12.3–15.4)
RETIC CT PCT: 1 % (ref 0.6–2.6)
RETIC CT PCT: 1.1 % (ref 0.6–2.6)
Total Iron Binding Capacity: 390 ug/dL (ref 250–450)
UIBC: 282 ug/dL (ref 131–425)
Vitamin B-12: 611 pg/mL (ref 232–1245)
WBC: 6.2 10*3/uL (ref 3.4–10.8)
WBC: 5.8 10*3/uL (ref 3.4–10.8)

## 2017-01-17 LAB — SPECIMEN STATUS REPORT

## 2017-02-04 ENCOUNTER — Encounter (INDEPENDENT_AMBULATORY_CARE_PROVIDER_SITE_OTHER): Payer: Self-pay | Admitting: Family Medicine

## 2017-02-04 ENCOUNTER — Ambulatory Visit (INDEPENDENT_AMBULATORY_CARE_PROVIDER_SITE_OTHER): Payer: 59 | Admitting: Family Medicine

## 2017-02-04 ENCOUNTER — Encounter (INDEPENDENT_AMBULATORY_CARE_PROVIDER_SITE_OTHER): Payer: Self-pay

## 2017-02-04 ENCOUNTER — Telehealth: Payer: 59 | Admitting: Nurse Practitioner

## 2017-02-04 DIAGNOSIS — R6889 Other general symptoms and signs: Secondary | ICD-10-CM

## 2017-02-04 DIAGNOSIS — J029 Acute pharyngitis, unspecified: Secondary | ICD-10-CM | POA: Diagnosis not present

## 2017-02-04 DIAGNOSIS — R05 Cough: Secondary | ICD-10-CM | POA: Diagnosis not present

## 2017-02-04 DIAGNOSIS — R51 Headache: Secondary | ICD-10-CM

## 2017-02-04 MED ORDER — OSELTAMIVIR PHOSPHATE 75 MG PO CAPS: 75.0000 mg | ORAL_CAPSULE | Freq: Two times a day (BID) | ORAL | 0 refills | Status: AC

## 2017-02-04 NOTE — Telephone Encounter (Signed)
Please reschedule

## 2017-02-04 NOTE — Progress Notes (Signed)
E visit for Flu like symptoms   We are sorry that you are not feeling well.  Here is how we plan to help! Based on what you have shared with me it looks like you may have flu-like symptoms that should be watched but do not seem to indicate anti-viral treatment.   Influenza or "the flu" is   an infection caused by a respiratory virus. The flu virus is highly contagious and persons who did not receive their yearly flu vaccination may "catch" the flu from close contact.  We have anti-viral medications to treat the viruses that cause this infection. They are not a "cure" and only shorten the course of the infection. These prescriptions are most effective when they are given within the first 2 days of "flu" symptoms. Antiviral medication are indicated if you have a high risk of complications from the flu. You should  also consider an antiviral medication if you are in close contact with someone who is at risk. These medications can help patients avoid complications from the flu  but have side effects that you should know. Possible side effects from Tamiflu or oseltamivir include nausea, vomiting, diarrhea, dizziness, headaches, eye redness, sleep problems or other respiratory symptoms. You should not take Tamiflu if you have an allergy to oseltamivir or any to the ingredients in Tamiflu.  In the event you develop a fever greater than 101.4, I am prescribing Tamiflu 75mg  twice daily for 5 days.    Based upon your symptoms and potential risk factors I recommend that you follow the flu symptoms recommendation that I have listed below.  ANYONE WHO HAS FLU SYMPTOMS SHOULD: . Stay home. The flu is highly contagious and going out or to work exposes others! . Be sure to drink plenty of fluids. Water is fine as well as fruit juices, sodas and electrolyte beverages. You may want to stay away from caffeine or alcohol. If you are nauseated, try taking small sips of liquids. How do you know if you are getting enough  fluid? Your urine should be a pale yellow or almost colorless. . Get rest. . Taking a steamy shower or using a humidifier may help nasal congestion and ease sore throat pain. Using a saline nasal spray works much the same way. . Cough drops, hard candies and sore throat lozenges may ease your cough. . Line up a caregiver. Have someone check on you regularly.   GET HELP RIGHT AWAY IF: . You cannot keep down liquids or your medications. . You become short of breath . Your fell like you are going to pass out or loose consciousness. . Your symptoms persist after you have completed your treatment plan MAKE SURE YOU   Understand these instructions.  Will watch your condition.  Will get help right away if you are not doing well or get worse.  Your e-visit answers were reviewed by a board certified advanced clinical practitioner to complete your personal care plan.  Depending on the condition, your plan could have included both over the counter or prescription medications.  If there is a problem please reply  once you have received a response from your provider.  Your safety is important to [place].  If you have drug allergies check your prescription carefully.    You can use MyChart to ask questions about today's visit, request a non-urgent call back, or ask for a work or school excuse for 24 hours related to this e-Visit. If it has been greater than 24 hours  you will need to follow up with your provider, or enter a new e-Visit to address those concerns.  You will get an e-mail in the next two days asking about your experience.  I hope that your e-visit has been valuable and will speed your recovery. Thank you for using e-visits.

## 2017-05-30 ENCOUNTER — Encounter: Payer: Self-pay | Admitting: Internal Medicine

## 2017-06-27 ENCOUNTER — Encounter: Payer: Self-pay | Admitting: Internal Medicine

## 2017-06-27 ENCOUNTER — Ambulatory Visit (INDEPENDENT_AMBULATORY_CARE_PROVIDER_SITE_OTHER): Payer: 59 | Admitting: Internal Medicine

## 2017-06-27 VITALS — BP 120/80 | HR 78 | Temp 98.3°F | Ht 64.0 in | Wt 227.5 lb

## 2017-06-27 DIAGNOSIS — K219 Gastro-esophageal reflux disease without esophagitis: Secondary | ICD-10-CM | POA: Diagnosis not present

## 2017-06-27 DIAGNOSIS — Z0000 Encounter for general adult medical examination without abnormal findings: Secondary | ICD-10-CM | POA: Diagnosis not present

## 2017-06-27 DIAGNOSIS — E119 Type 2 diabetes mellitus without complications: Secondary | ICD-10-CM

## 2017-06-27 LAB — COMPREHENSIVE METABOLIC PANEL
ALBUMIN: 3.8 g/dL (ref 3.5–5.2)
ALT: 14 U/L (ref 0–35)
AST: 14 U/L (ref 0–37)
Alkaline Phosphatase: 62 U/L (ref 39–117)
BILIRUBIN TOTAL: 0.3 mg/dL (ref 0.2–1.2)
BUN: 9 mg/dL (ref 6–23)
CALCIUM: 9.4 mg/dL (ref 8.4–10.5)
CO2: 27 meq/L (ref 19–32)
CREATININE: 0.88 mg/dL (ref 0.40–1.20)
Chloride: 105 mEq/L (ref 96–112)
GFR: 90.34 mL/min (ref >60.00)
Glucose, Bld: 159 mg/dL — ABNORMAL HIGH (ref 70–99)
Potassium: 3.7 mEq/L (ref 3.5–5.1)
Sodium: 138 mEq/L (ref 135–145)
Total Protein: 6.7 g/dL (ref 6.0–8.3)

## 2017-06-27 LAB — CBC
HCT: 35.8 % — ABNORMAL LOW (ref 36.0–46.0)
HEMOGLOBIN: 11.3 g/dL — AB (ref 12.0–15.0)
MCHC: 31.6 g/dL (ref 30.0–36.0)
MCV: 69 fl — ABNORMAL LOW (ref 78.0–100.0)
PLATELETS: 404 10*3/uL — AB (ref 150.0–400.0)
RBC: 5.18 Mil/uL — AB (ref 3.87–5.11)
RDW: 16.2 % — ABNORMAL HIGH (ref 11.5–15.5)
WBC: 6.7 10*3/uL (ref 4.0–10.5)

## 2017-06-27 LAB — HEMOGLOBIN A1C: Hgb A1c MFr Bld: 6.4 % (ref 4.6–6.5)

## 2017-06-27 LAB — LIPID PANEL
CHOLESTEROL: 194 mg/dL (ref 0–200)
HDL: 50.9 mg/dL (ref >39.00)
LDL Cholesterol: 119 mg/dL — ABNORMAL HIGH (ref 0–99)
NonHDL: 142.72
TRIGLYCERIDES: 121 mg/dL (ref 0.0–149.0)
Total CHOL/HDL Ratio: 4
VLDL: 24.2 mg/dL (ref 0.0–40.0)

## 2017-06-27 NOTE — Assessment & Plan Note (Signed)
Discussed how weight loss can help reduce reflux Try to avoid foods that you know trigger your reflux Continue Protonix for now

## 2017-06-27 NOTE — Patient Instructions (Signed)

## 2017-06-27 NOTE — Progress Notes (Signed)
Subjective:    Patient ID: Alejandra Watkins, female    DOB: [birth year], 41 y.o.   MRN: [id number]  HPI  Pt presents to the clinic today for her annual exam. She is also due to follow up chronic conditions.  GERD: She reports this has improved significantly since she started the Protonix. She does not have to take anything for breakthrough.  DM 2: Her last A1C was 6%, 12/2016. She stopped taking Metformin because her blood sugars were dropping into the low 80's, and she was feeling jittery at times (even after meals). Her sugars range in the low 100's fasting. She checks her feet daily.   Flu: 09/2016 Tetanus: > 5, < 10 years ago Pneumovax: never Pap Smear: 04/2016, [facility] Mammogram: 04/2016, [facility] Vision Screening: yearly, [place] Dentist: biannually  Diet: She does eat meat. She consumes more veggies than fruits. She does not eat fried foods. She drinks mostly water, unsweet tea. Exercise: None  Review of Systems      Past Medical History:  Diagnosis Date  . Acid reflux   . Diabetes mellitus without complication ([place])   . History of chicken pox     Current Outpatient Prescriptions  Medication Sig Dispense Refill  . Cholecalciferol (VITAMIN D3) 5000 units CAPS Take 1 capsule by mouth daily.    Marland Kitchen desogestrel-ethinyl estradiol (APRI,EMOQUETTE,SOLIA) 0.15-30 MG-MCG tablet Take 1 tablet by mouth daily.    Marland Kitchen glucose blood (BAYER CONTOUR NEXT TEST) test strip 1 each by Other route 2 (two) times daily as needed for other. Use as instructed 200 each 5  . Multiple Vitamins-Minerals (MULTIVITAMIN WOMEN) TABS Take 1 tablet by mouth daily.    . pantoprazole (PROTONIX) 40 MG tablet   3  . metFORMIN (GLUCOPHAGE) 500 MG tablet Take 500 mg by mouth daily.     No current facility-administered medications for this visit.     No Known Allergies  Family History  Problem Relation Age of Onset  . Hypertension Mother   . Alcoholism Father   . Hypertension Paternal  Grandmother   . Diabetes Paternal Grandmother   . Cancer Paternal Uncle        lung, colon  . Stroke Neg Hx   . Heart disease Neg Hx     Social History   Social History  . Marital status: Married    Spouse name: Jori Moll  . Number of children: 3  . 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 Smoking status: Never Smoker  . Smokeless tobacco: Never Used  . Alcohol use 0.6 oz/week    1 Glasses of wine per week     Comment: occasional  . Drug use: No  . Sexual activity: Yes    Birth control/ protection: Surgical   Other Topics Concern  . Not on file   Social History Narrative  . No narrative on file     Constitutional: Denies fever, malaise, fatigue, headache or abrupt weight changes.  HEENT: Denies eye pain, eye redness, ear pain, ringing in the ears, wax buildup, runny nose, nasal congestion, bloody nose, or sore throat. Respiratory: Denies difficulty breathing, shortness of breath, cough or sputum production.   Cardiovascular: Denies chest pain, chest tightness, palpitations or swelling in the hands or feet.  Gastrointestinal: Denies abdominal pain, bloating, constipation, diarrhea or blood in the stool.  GU: Denies urgency, frequency, pain with urination, burning sensation, blood in urine, odor  or discharge. Musculoskeletal: Denies decrease in range of motion, difficulty with gait, muscle pain or joint pain and swelling.  Skin: Denies redness, rashes, lesions or ulcercations.  Neurological: Denies dizziness, difficulty with memory, difficulty with speech or problems with balance and coordination.  Psych: Denies anxiety, depression, SI/HI.  No other specific complaints in a complete review of systems (except as listed in HPI above).  Objective:   Physical Exam   BP 120/80 (BP Location: Right Arm, Patient Position: Sitting, Cuff Size: Large)   Pulse 78   Temp 98.3 F (36.8 C) (Oral)   Ht 5'  4" (1.626 m)   Wt 227 lb 8 oz (103.2 kg)   LMP  (LMP Unknown)   SpO2 99%   BMI 39.05 kg/m  Wt Readings from Last 3 Encounters:  06/27/17 227 lb 8 oz (103.2 kg)  01/16/17 228 lb (103.4 kg)  01/01/17 230 lb (104.3 kg)    General: Appears her stated age, obese in NAD. Skin: Warm, dry and intact.  HEENT: Head: normal shape and size; Eyes: sclera white, no icterus, conjunctiva pink, PERRLA and EOMs intact; Right Ear: Cerumen impaction; Left Ear: Tm's gray and intact, normal light reflex; Throat/Mouth: Teeth present, mucosa pink and moist, no exudate, lesions or ulcerations noted.  Neck:  Neck supple, trachea midline. No masses, lumps or thyromegaly present.  Cardiovascular: Normal rate and rhythm. S1,S2 noted.  No murmur, rubs or gallops noted. No JVD or BLE edema.  Pulmonary/Chest: Normal effort and positive vesicular breath sounds. No respiratory distress. No wheezes, rales or ronchi noted.  Abdomen: Soft and nontender. Normal bowel sounds. No distention or masses noted. Liver, spleen and kidneys non palpable. Musculoskeletal:Strength 5/5 BUE/BLE. No difficulty with gait.  Neurological: Alert and oriented. Cranial nerves II-XII grossly intact. Coordination normal.  Psychiatric: Mood and affect normal. Behavior is normal. Judgment and thought content normal.     BMET    Component Value Date/Time   NA 141 01/07/2017 0926   NA 141 10/30/2014 1545   K 4.5 01/07/2017 0926   K 4.5 10/30/2014 1545   CL 102 01/07/2017 0926   CL 107 10/30/2014 1545   CO2 23 01/07/2017 0926   CO2 28 10/30/2014 1545   GLUCOSE 94 01/07/2017 0926   GLUCOSE 103 (H) 07/23/2016 2117   GLUCOSE 83 10/30/2014 1545   BUN 12 01/07/2017 0926   BUN 16 10/30/2014 1545   CREATININE 0.79 01/07/2017 0926   CREATININE 1.17 10/30/2014 1545   CALCIUM 9.5 01/07/2017 0926   CALCIUM 8.0 (L) 10/30/2014 1545   GFRNONAA 93 01/07/2017 0926   GFRNONAA 54 (L) 10/30/2014 1545   GFRNONAA >60 05/08/2012 1158   GFRAA 107  01/07/2017 0926   GFRAA >60 10/30/2014 1545   GFRAA >60 05/08/2012 1158    Lipid Panel     Component Value Date/Time   CHOL 219 (H) 01/07/2017 0926   TRIG 124 01/07/2017 0926   HDL 53 01/07/2017 0926   CHOLHDL 4 06/25/2016 1131   VLDL 26.2 06/25/2016 1131   LDLCALC 141 (H) 01/07/2017 0926    CBC    Component Value Date/Time   WBC 5.8 01/07/2017 1518   WBC 9.9 07/23/2016 2117   RBC 5.29 (H) 01/07/2017 1518   RBC 5.13 07/23/2016 2117   HGB 10.9 (L) 01/07/2017 1518   HCT 37.2 01/07/2017 1518   PLT 409 (H) 01/07/2017 1518   MCV 70 (L) 01/07/2017 1518   MCV 72 (L) 10/30/2014 1545   MCH 20.6 (L) 01/07/2017 1518  MCH 23.3 (L) 07/23/2016 2117   MCHC 29.3 (L) 01/07/2017 1518   MCHC 33.4 07/23/2016 2117   RDW 17.5 (H) 01/07/2017 1518   RDW 15.5 (H) 10/30/2014 1545   LYMPHSABS 2.9 01/07/2017 1518   LYMPHSABS 2.6 10/30/2014 1545   MONOABS 1.1 (H) 07/23/2016 2117   MONOABS 0.8 10/30/2014 1545   EOSABS 0.2 01/07/2017 1518   EOSABS 0.1 10/30/2014 1545   BASOSABS 0.0 01/07/2017 1518   BASOSABS 0.1 10/30/2014 1545    Hgb A1C Lab Results  Component Value Date   HGBA1C 6.0 (H) 01/07/2017           Assessment & Plan:   Preventative Health Maintenance:  Encouraged her to get a flu shot in the fall She declines tetanus booster or pneumovax today Pap smear and mammogram UTD Encouraged her to consume a balanced diet and exercise regimen Advised her to see an eye doctor and dentist annually Will check CBC, CMET, Lipid and A1C today  RTC in 1 year, sooner if needed Webb Silversmith, NP

## 2017-06-27 NOTE — Assessment & Plan Note (Signed)
A1C today She has stopped her Metformin, will restart if A1C is > 6.5% Foot exam today She declines immunizations Advised her to continue yearly eye exams Encouraged her to consume a low fat, low carb diet and exercise for weight loss

## 2017-07-04 ENCOUNTER — Encounter: Payer: Self-pay | Admitting: Family Medicine

## 2017-07-04 ENCOUNTER — Ambulatory Visit (INDEPENDENT_AMBULATORY_CARE_PROVIDER_SITE_OTHER): Payer: 59 | Admitting: Family Medicine

## 2017-07-04 VITALS — BP 120/70 | HR 90 | Ht 64.0 in | Wt 225.0 lb

## 2017-07-04 DIAGNOSIS — R300 Dysuria: Secondary | ICD-10-CM

## 2017-07-04 DIAGNOSIS — R350 Frequency of micturition: Secondary | ICD-10-CM

## 2017-07-04 LAB — POC URINALSYSI DIPSTICK (AUTOMATED)
Bilirubin, UA: NEGATIVE
GLUCOSE UA: NEGATIVE
Ketones, UA: NEGATIVE
Leukocytes, UA: NEGATIVE
NITRITE UA: NEGATIVE
PH UA: 6 (ref 5.0–8.0)
Protein, UA: NEGATIVE
UROBILINOGEN UA: 0.2 U/dL

## 2017-07-04 NOTE — Progress Notes (Signed)
Pre visit review using our clinic review tool, if applicable. No additional management support is needed unless otherwise documented below in the visit note. 

## 2017-07-04 NOTE — Progress Notes (Signed)
SUBJECTIVE: Alejandra Watkins is a 41 y.o. female who complains of urinary frequency, left flank pain x 2 days, without dysuria, fever, chills, or abnormal vaginal discharge or bleeding.   Pain was severe and today has almost completely resolved. Current Outpatient Prescriptions on File Prior to Visit  Medication Sig Dispense Refill  . Cholecalciferol (VITAMIN D3) 5000 units CAPS Take 1 capsule by mouth daily.    Marland Kitchen desogestrel-ethinyl estradiol (APRI,EMOQUETTE,SOLIA) 0.15-30 MG-MCG tablet Take 1 tablet by mouth daily.    Marland Kitchen glucose blood (BAYER CONTOUR NEXT TEST) test strip 1 each by Other route 2 (two) times daily as needed for other. Use as instructed 200 each 5  . Multiple Vitamins-Minerals (MULTIVITAMIN WOMEN) TABS Take 1 tablet by mouth daily.    . pantoprazole (PROTONIX) 40 MG tablet   3   No current facility-administered medications on file prior to visit.     No Known Allergies  Past Medical History:  Diagnosis Date  . Acid reflux   . Diabetes mellitus without complication ([place])   . History of chicken pox     Past Surgical History:  Procedure Laterality Date  . ABDOMINAL HYSTERECTOMY     partial  . OVARIAN CYST REMOVAL Left   . TUBAL LIGATION      Family History  Problem Relation Age of Onset  . Hypertension Mother   . Alcoholism Father   . Hypertension Paternal Grandmother   . Diabetes Paternal Grandmother   . Cancer Paternal Uncle        lung, colon  . Stroke Neg Hx   . Heart disease Neg Hx     Social History   Social History  . Marital status: Married    Spouse name: Jori Moll  . Number of children: 3  . 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 Smoking status: Never Smoker  . Smokeless tobacco: Never Used  . Alcohol use 0.6 oz/week    1 Glasses of wine per week     Comment: occasional  . Drug use: No  . Sexual activity: Yes    Birth control/ protection: Surgical   Other  Topics Concern  . Not on file   Social History Narrative  . No narrative on file   The PMH, PSH, Social History, Family History, Medications, and allergies have been reviewed in [facility], and have been updated if relevant.  OBJECTIVE:  BP 120/70   Pulse 90   Ht 5\' 4"  (1.626 m)   Wt 225 lb (102.1 kg)   LMP  (LMP Unknown)   SpO2 98%   BMI 38.62 kg/m   Appears well, in no apparent distress.  Vital signs are normal. The abdomen is soft without tenderness, guarding, mass, rebound or organomegaly. No CVA tenderness or inguinal adenopathy noted. Urine dipstick shows positive for RBC's.    ASSESSMENT: ? Passed a kidney stone  PLAN: Since symptoms resolved, push fluids, no further work up at this time. If symptoms return, plan imaging. Will send urine for cx. The patient indicates understanding of these issues and agrees with the plan.

## 2017-07-05 LAB — URINE CULTURE: ORGANISM ID, BACTERIA: NO GROWTH

## 2017-07-08 ENCOUNTER — Encounter: Payer: Self-pay | Admitting: Internal Medicine

## 2017-08-20 ENCOUNTER — Other Ambulatory Visit: Payer: Self-pay | Admitting: Obstetrics and Gynecology

## 2017-08-20 DIAGNOSIS — Z1231 Encounter for screening mammogram for malignant neoplasm of breast: Secondary | ICD-10-CM

## 2017-08-22 ENCOUNTER — Ambulatory Visit
Admission: RE | Admit: 2017-08-22 | Discharge: 2017-08-22 | Disposition: A | Discharge status: Home / Self Care | Payer: 59 | Source: Ambulatory Visit | Attending: Obstetrics and Gynecology | Admitting: Obstetrics and Gynecology

## 2017-08-22 DIAGNOSIS — Z1231 Encounter for screening mammogram for malignant neoplasm of breast: Secondary | ICD-10-CM | POA: Diagnosis not present

## 2017-08-27 ENCOUNTER — Encounter: Payer: Self-pay | Admitting: Obstetrics and Gynecology

## 2017-08-27 ENCOUNTER — Ambulatory Visit (INDEPENDENT_AMBULATORY_CARE_PROVIDER_SITE_OTHER): Payer: 59 | Admitting: Obstetrics and Gynecology

## 2017-08-27 VITALS — BP 128/80 | HR 77 | Ht 64.0 in | Wt 231.0 lb

## 2017-08-27 DIAGNOSIS — Z1151 Encounter for screening for human papillomavirus (HPV): Secondary | ICD-10-CM | POA: Diagnosis not present

## 2017-08-27 DIAGNOSIS — Z1231 Encounter for screening mammogram for malignant neoplasm of breast: Secondary | ICD-10-CM | POA: Diagnosis not present

## 2017-08-27 DIAGNOSIS — Z124 Encounter for screening for malignant neoplasm of cervix: Secondary | ICD-10-CM

## 2017-08-27 DIAGNOSIS — Z1239 Encounter for other screening for malignant neoplasm of breast: Secondary | ICD-10-CM

## 2017-08-27 DIAGNOSIS — Z01419 Encounter for gynecological examination (general) (routine) without abnormal findings: Secondary | ICD-10-CM | POA: Diagnosis not present

## 2017-08-27 NOTE — Progress Notes (Signed)
PCP:  [name], NP   Chief Complaint  Patient presents with  . Gynecologic Exam     HPI:      [name] is a 41 y.o. G3P3000 who LMP was No LMP recorded (lmp unknown). Patient has had a hysterectomy., presents today for her annual examination.  Her menses are absent due to lap supracx hyst 2013 due to [place]. She does not have intermenstrual bleeding.  Sex activity: single partner, contraception - status post hysterectomy.  Last Pap: October 05, 2014  Results were: no abnormalities /neg HPV DNA  Hx of STDs: none  Last mammogram: August 22, 2017  Awaiting results.  There is no FH of breast cancer. There is no FH of ovarian cancer. The patient does do self-breast exams.  Tobacco use: The patient denies current or previous tobacco use. Alcohol use: social drinker No drug use.  Exercise: not active  She does get adequate calcium and Vitamin D in her diet.  Labs with PCP.   Past Medical History:  Diagnosis Date  . Acid reflux   . Diabetes mellitus without complication ([place])   . History of chicken pox     Past Surgical History:  Procedure Laterality Date  . ABDOMINAL HYSTERECTOMY     partial  . BREAST BIOPSY Left 15 yrs ago   needle biopsy. Benign  . OVARIAN CYST REMOVAL Left   . TUBAL LIGATION      Family History  Problem Relation Age of Onset  . Hypertension Mother   . Alcoholism Father   . Hypertension Paternal Grandmother   . Diabetes Paternal Grandmother   . Colon cancer Other   . Stroke Neg Hx   . Heart disease Neg Hx     Social History   Social History  . Marital status: Married    Spouse name: Jori Moll  . Number of children: 3  . Years of education: N/A   Occupational History  . Practice Administrator [employer]   Social History Main Topics  . Smoking status: Never Smoker  . Smokeless tobacco: Never Used  . Alcohol use 0.6 oz/week    1 Glasses of wine per week     Comment: occasional  . Drug use: No  . Sexual activity:  Yes    Birth control/ protection: Surgical   Other Topics Concern  . Not on file   Social History Narrative  . No narrative on file    No outpatient prescriptions have been marked as taking for the 08/27/17 encounter (Office Visit) with Copland, Deirdre Evener, PA-C.     ROS:  Review of Systems  Constitutional: Negative for fatigue, fever and unexpected weight change.  Respiratory: Negative for cough, shortness of breath and wheezing.   Cardiovascular: Negative for chest pain, palpitations and leg swelling.  Gastrointestinal: Negative for blood in stool, constipation, diarrhea, nausea and vomiting.  Endocrine: Negative for cold intolerance, heat intolerance and polyuria.  Genitourinary: Negative for dyspareunia, dysuria, flank pain, frequency, genital sores, hematuria, menstrual problem, pelvic pain, urgency, vaginal bleeding, vaginal discharge and vaginal pain.  Musculoskeletal: Negative for back pain, joint swelling and myalgias.  Skin: Negative for rash.  Neurological: Negative for dizziness, syncope, light-headedness, numbness and headaches.  Hematological: Negative for adenopathy.  Psychiatric/Behavioral: Negative for agitation, confusion, sleep disturbance and suicidal ideas. The patient is not nervous/anxious.      Objective: BP 128/80   Pulse 77   Ht 5\' 4"  (1.626 m)   Wt 231 lb (104.8 kg)  LMP  (LMP Unknown)   BMI 39.65 kg/m    Physical Exam  Constitutional: She is oriented to person, place, and time. She appears well-developed and well-nourished.  Genitourinary: Vagina normal. There is no rash or tenderness on the right labia. There is no rash or tenderness on the left labia. No erythema or tenderness in the vagina. No vaginal discharge found. Right adnexum does not display mass and does not display tenderness. Left adnexum does not display mass and does not display tenderness. Cervix does not exhibit motion tenderness or polyp.  Genitourinary Comments: UTERUS SURG  ABSENT  Neck: Normal range of motion. No thyromegaly present.  Cardiovascular: Normal rate, regular rhythm and normal heart sounds.   No murmur heard. Pulmonary/Chest: Effort normal and breath sounds normal. Right breast exhibits no mass, no nipple discharge, no skin change and no tenderness. Left breast exhibits no mass, no nipple discharge, no skin change and no tenderness.  Abdominal: Soft. There is no tenderness. There is no guarding.  Musculoskeletal: Normal range of motion.  Neurological: She is alert and oriented to person, place, and time. No cranial nerve deficit.  Psychiatric: She has a normal mood and affect. Her behavior is normal.  Vitals reviewed.   Assessment/Plan: Encounter for annual routine gynecological examination  Cervical cancer screening - Plan: IGP, Aptima HPV  Screening for HPV (human papillomavirus) - Plan: IGP, Aptima HPV  Screening for breast cancer - Pt has already had done. Awaiting results.            GYN counsel breast self exam, mammography screening, adequate intake of calcium and vitamin D, diet and exercise     F/U  Return in about 1 year (around 08/27/2018).  Alicia B. Copland, PA-C 08/27/2017 10:43 AM

## 2017-08-28 ENCOUNTER — Other Ambulatory Visit: Payer: Self-pay | Admitting: *Deleted

## 2017-08-28 ENCOUNTER — Inpatient Hospital Stay
Admission: RE | Admit: 2017-08-28 | Discharge: 2017-08-28 | Disposition: A | Discharge status: Home / Self Care | Payer: Self-pay | Source: Ambulatory Visit | Attending: *Deleted | Admitting: *Deleted

## 2017-08-28 DIAGNOSIS — Z9289 Personal history of other medical treatment: Secondary | ICD-10-CM

## 2017-08-29 ENCOUNTER — Encounter: Payer: Self-pay | Admitting: Obstetrics and Gynecology

## 2017-08-30 LAB — IGP, APTIMA HPV
HPV APTIMA: NEGATIVE
PAP Smear Comment: 0

## 2017-09-22 LAB — HM DIABETES EYE EXAM

## 2017-11-21 ENCOUNTER — Other Ambulatory Visit: Payer: Self-pay

## 2017-11-21 NOTE — Patient Outreach (Signed)
[facility]) Care Management  11/21/2017  Alejandra Watkins 1976-02-23 [id number]   Case closed in [place].  Member is being followed by the [employer] program Member enrolled in an external program Peter Garter RN, [facility] Care Management Coordinator-Link to [facility] [phone]

## 2017-12-09 ENCOUNTER — Ambulatory Visit (INDEPENDENT_AMBULATORY_CARE_PROVIDER_SITE_OTHER): Payer: 59 | Admitting: Internal Medicine

## 2017-12-09 ENCOUNTER — Encounter: Payer: Self-pay | Admitting: Internal Medicine

## 2017-12-09 VITALS — BP 124/78 | HR 82 | Temp 98.7°F | Wt 235.2 lb

## 2017-12-09 DIAGNOSIS — Z8742 Personal history of other diseases of the female genital tract: Secondary | ICD-10-CM

## 2017-12-09 DIAGNOSIS — R109 Unspecified abdominal pain: Secondary | ICD-10-CM | POA: Diagnosis not present

## 2017-12-09 DIAGNOSIS — R102 Pelvic and perineal pain: Secondary | ICD-10-CM | POA: Diagnosis not present

## 2017-12-09 LAB — POC URINALSYSI DIPSTICK (AUTOMATED)
BILIRUBIN UA: NEGATIVE
GLUCOSE UA: NEGATIVE
Ketones, UA: NEGATIVE
Leukocytes, UA: NEGATIVE
NITRITE UA: NEGATIVE
Protein, UA: NEGATIVE
Spec Grav, UA: >=1.03 — AB (ref 1.010–1.025)
Urobilinogen, UA: 0.2 E.U./dL
pH, UA: 6 (ref 5.0–8.0)

## 2017-12-09 NOTE — Progress Notes (Signed)
Subjective:    Patient ID: Alejandra Watkins, female    DOB: [birth year], 41 y.o.   MRN: [id number]  HPI  Pt presents to the clinic today with c/o left lower abdominal pain and left flank pain. This started 4 days ago. She describes the pain as achy and cramping. She denies urgency, frequency or dysuria. She denies pain with intercourse. She denies vaginal discharge, irritation or odor. She had an ovarian cyst noted on CT abdomen from 07/2016. She has had ovarian cyst removed in the past. She has been taking Ibuprofen with some relief.  Review of Systems      Past Medical History:  Diagnosis Date  . Acid reflux   . Diabetes mellitus without complication ([place])   . History of chicken pox     Current Outpatient Medications  Medication Sig Dispense Refill  . Cholecalciferol (VITAMIN D3) 5000 units CAPS Take 1 capsule by mouth daily.    Marland Kitchen glucose blood (BAYER CONTOUR NEXT TEST) test strip 1 each by Other route 2 (two) times daily as needed for other. Use as instructed 200 each 5  . Multiple Vitamins-Minerals (MULTIVITAMIN WOMEN) TABS Take 1 tablet by mouth daily.    . pantoprazole (PROTONIX) 40 MG tablet   3   No current facility-administered medications for this visit.     No Known Allergies  Family History  Problem Relation Age of Onset  . Hypertension Mother   . Alcoholism Father   . Hypertension Paternal Grandmother   . Diabetes Paternal Grandmother   . Colon cancer Other   . Stroke Neg Hx   . Heart disease Neg Hx     Social History   Socioeconomic History  . Marital status: Married    Spouse name: Jori Moll  . Number of children: 3  . Years of education: Not on file  . Highest education level: Not on file  Social Needs  . Financial resource strain: Not on file  . Food insecurity - worry: Not on file  . Food insecurity - inability: Not on file  . Transportation needs - medical: Not on file  . Transportation needs - non-medical: Not on file  Occupational History  .  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[employer]  Tobacco Use  . Smoking status: Never Smoker  . Smokeless tobacco: Never Used  Substance and Sexual Activity  . Alcohol use: Yes    Alcohol/week: 0.6 oz    Types: 1 Glasses of wine per week    Comment: occasional  . Drug use: No  . Sexual activity: Yes    Birth control/protection: Surgical  Other Topics Concern  . Not on file  Social History Narrative  . Not on file     Constitutional: Denies fever, malaise, fatigue, headache or abrupt weight changes.  Gastrointestinal: Pt reports left lower abdominal pain. Denies bloating, constipation, diarrhea or blood in the stool.  GU: Pt reports left flank pain. Denies urgency, frequency, pain with urination, burning sensation, blood in urine, odor or discharge.  No other specific complaints in a complete review of systems (except as listed in HPI above).  Objective:   Physical Exam   BP 124/78   Pulse 82   Temp 98.7 F (37.1 C) (Oral)   Wt 235 lb 4 oz (106.7 kg)   LMP  (LMP Unknown)   SpO2 97%   BMI 40.38 kg/m  Wt Readings from Last 3 Encounters:  12/09/17 235 lb 4 oz (106.7 kg)  08/27/17 231 lb (  104.8 kg)  07/04/17 225 lb (102.1 kg)    General: Appears her stated age, obese in NAD. Abdomen: Soft and tender in the left pelvic area. Normal bowel sounds. No distention or masses noted. No CVA tenderness noted. Musculoskeletal: Normal range of motion. No signs of joint swelling. No difficulty with gait.    BMET    Component Value Date/Time   NA 138 06/27/2017 0840   NA 141 01/07/2017 0926   NA 141 10/30/2014 1545   K 3.7 06/27/2017 0840   K 4.5 10/30/2014 1545   CL 105 06/27/2017 0840   CL 107 10/30/2014 1545   CO2 27 06/27/2017 0840   CO2 28 10/30/2014 1545   GLUCOSE 159 (H) 06/27/2017 0840   GLUCOSE 83 10/30/2014 1545   BUN 9 06/27/2017 0840   BUN 12 01/07/2017 0926   BUN 16 10/30/2014 1545   CREATININE 0.88 06/27/2017 0840   CREATININE 1.17 10/30/2014  1545   CALCIUM 9.4 06/27/2017 0840   CALCIUM 8.0 (L) 10/30/2014 1545   GFRNONAA 93 01/07/2017 0926   GFRNONAA 54 (L) 10/30/2014 1545   GFRNONAA >60 05/08/2012 1158   GFRAA 107 01/07/2017 0926   GFRAA >60 10/30/2014 1545   GFRAA >60 05/08/2012 1158    Lipid Panel     Component Value Date/Time   CHOL 194 06/27/2017 0840   CHOL 219 (H) 01/07/2017 0926   TRIG 121.0 06/27/2017 0840   HDL 50.90 06/27/2017 0840   HDL 53 01/07/2017 0926   CHOLHDL 4 06/27/2017 0840   VLDL 24.2 06/27/2017 0840   LDLCALC 119 (H) 06/27/2017 0840   LDLCALC 141 (H) 01/07/2017 0926    CBC    Component Value Date/Time   WBC 6.7 06/27/2017 0840   RBC 5.18 (H) 06/27/2017 0840   HGB 11.3 (L) 06/27/2017 0840   HGB 10.9 (L) 01/07/2017 1518   HCT 35.8 (L) 06/27/2017 0840   HCT 37.2 01/07/2017 1518   PLT 404.0 (H) 06/27/2017 0840   PLT 409 (H) 01/07/2017 1518   MCV 69.0 Repeated and verified X2. (L) 06/27/2017 0840   MCV 70 (L) 01/07/2017 1518   MCV 72 (L) 10/30/2014 1545   MCH 20.6 (L) 01/07/2017 1518   MCH 23.3 (L) 07/23/2016 2117   MCHC 31.6 06/27/2017 0840   RDW 16.2 (H) 06/27/2017 0840   RDW 17.5 (H) 01/07/2017 1518   RDW 15.5 (H) 10/30/2014 1545   LYMPHSABS 2.9 01/07/2017 1518   LYMPHSABS 2.6 10/30/2014 1545   MONOABS 1.1 (H) 07/23/2016 2117   MONOABS 0.8 10/30/2014 1545   EOSABS 0.2 01/07/2017 1518   EOSABS 0.1 10/30/2014 1545   BASOSABS 0.0 01/07/2017 1518   BASOSABS 0.1 10/30/2014 1545    Hgb A1C Lab Results  Component Value Date   HGBA1C 6.4 06/27/2017           Assessment & Plan:   LLQ Pelvic Pain, Left Flank Pain:  Urinalysis: 3+ blood Will send urine culture No abx indicated unless culture is positive Will obtain pelvic and transvaginal ultrasound Encouraged her to continue Ibuprofen, heat may be helpful as well  Return precautions discussed, will follow up after ultrasound Webb Silversmith, NP

## 2017-12-09 NOTE — Patient Instructions (Signed)
Pelvic Pain, Female °Pelvic pain is pain in your lower belly (abdomen), below your belly button and between your hips. The pain may start suddenly (acute), keep coming back (recurring), or last a long time (chronic). Pelvic pain that lasts longer than six months is considered chronic. There are many causes of pelvic pain. Sometimes the cause of your pelvic pain is not known. °Follow these instructions at home: °· Take over-the-counter and prescription medicines only as told by your doctor. °· Rest as told by your doctor. °· Do not have sex it if hurts. °· Keep a journal of your pelvic pain. Write down: °? When the pain started. °? Where the pain is located. °? What seems to make the pain better or worse, such as food or your menstrual cycle. °? Any symptoms you have along with the pain. °· Keep all follow-up visits as told by your doctor. This is important. °Contact a doctor if: °· Medicine does not help your pain. °· Your pain comes back. °· You have new symptoms. °· You have unusual vaginal discharge or bleeding. °· You have a fever or chills. °· You are having a hard time pooping (constipation). °· You have blood in your pee (urine) or poop (stool). °· Your pee smells bad. °· You feel weak or lightheaded. °Get help right away if: °· You have sudden pain that is very bad. °· Your pain continues to get worse. °· You have very bad pain and also have any of the following symptoms: °? A fever. °? Feeling stick to your stomach (nausea). °? Throwing up (vomiting). °? Being very sweaty. °· You pass out (lose consciousness). °This information is not intended to replace advice given to you by your health care provider. Make sure you discuss any questions you have with your health care provider. °Document Released: 05/21/2008 Document Revised: 12/28/2015 Document Reviewed: 09/23/2015 °Elsevier Interactive Patient Education © 2018 Elsevier Inc. ° °

## 2017-12-10 LAB — URINE CULTURE
MICRO NUMBER:: 81445957
SPECIMEN QUALITY:: ADEQUATE

## 2017-12-16 ENCOUNTER — Ambulatory Visit: Payer: 59

## 2017-12-20 ENCOUNTER — Ambulatory Visit
Admission: RE | Admit: 2017-12-20 | Discharge: 2017-12-20 | Disposition: A | Discharge status: Home / Self Care | Payer: No Typology Code available for payment source | Source: Ambulatory Visit | Attending: Internal Medicine | Admitting: Internal Medicine

## 2017-12-20 DIAGNOSIS — Z90710 Acquired absence of both cervix and uterus: Secondary | ICD-10-CM | POA: Diagnosis not present

## 2017-12-20 DIAGNOSIS — R102 Pelvic and perineal pain: Secondary | ICD-10-CM | POA: Diagnosis present

## 2017-12-20 DIAGNOSIS — N83292 Other ovarian cyst, left side: Secondary | ICD-10-CM | POA: Insufficient documentation

## 2017-12-20 DIAGNOSIS — Z90721 Acquired absence of ovaries, unilateral: Secondary | ICD-10-CM | POA: Insufficient documentation

## 2018-01-08 ENCOUNTER — Telehealth: Payer: No Typology Code available for payment source | Admitting: Family

## 2018-01-08 DIAGNOSIS — A084 Viral intestinal infection, unspecified: Secondary | ICD-10-CM

## 2018-01-08 MED ORDER — ONDANSETRON HCL 4 MG PO TABS: 4.0000 mg | ORAL_TABLET | Freq: Three times a day (TID) | ORAL | 0 refills | Status: DC | PRN

## 2018-01-08 NOTE — Progress Notes (Signed)

## 2018-03-24 ENCOUNTER — Encounter: Payer: Self-pay | Admitting: Adult Health

## 2018-03-24 ENCOUNTER — Ambulatory Visit (INDEPENDENT_AMBULATORY_CARE_PROVIDER_SITE_OTHER): Payer: Self-pay | Admitting: Adult Health

## 2018-03-24 VITALS — BP 122/67 | HR 92 | Temp 98.9°F | Wt 234.0 lb

## 2018-03-24 DIAGNOSIS — J020 Streptococcal pharyngitis: Secondary | ICD-10-CM

## 2018-03-24 DIAGNOSIS — J029 Acute pharyngitis, unspecified: Secondary | ICD-10-CM

## 2018-03-24 LAB — POCT INFLUENZA A/B
INFLUENZA B, POC: NEGATIVE
Influenza A, POC: NEGATIVE

## 2018-03-24 LAB — POCT RAPID STREP A (OFFICE): RAPID STREP A SCREEN: POSITIVE — AB

## 2018-03-24 MED ORDER — AMOXICILLIN 875 MG PO TABS: 875.0000 mg | ORAL_TABLET | Freq: Two times a day (BID) | ORAL | 0 refills | Status: DC

## 2018-03-24 MED ORDER — PREDNISONE 10 MG (21) PO TBPK: ORAL_TABLET | ORAL | 0 refills | Status: DC

## 2018-03-24 NOTE — Progress Notes (Signed)
Subjective:     Patient ID: Alejandra Watkins, female   DOB: [birth year], 42 y.o.   MRN: [id number]  HPI  Patient with sorethroat since this Friday 03/21/18. Daughter diagnosed with strep throat in office this morning. Reports has not taken temperature at home but feels she has had a fever x 2 days. Reports hurts to swallow, however able to swallow liquids and foods without difficulty.  See ROS.  Patient  denies any  body aches,chills, rash, chest pain, shortness of breath, nausea, vomiting, or diarrhea.   No LMP recorded (lmp unknown). Patient has had a hysterectomy. Partial hysterectomy - ovaries remain. She takes OCP to help prevent cysts per patient.    Review of Systems  Constitutional: Positive for fever.  HENT: Positive for sore throat. Negative for congestion, dental problem, drooling, ear discharge, ear pain, facial swelling, hearing loss, mouth sores, nosebleeds, postnasal drip, rhinorrhea, sinus pressure, sinus pain, sneezing, tinnitus, trouble swallowing and voice change.   Eyes: Negative.   Gastrointestinal: Negative.   Endocrine: Negative.   Genitourinary: Negative.   Allergic/Immunologic: Negative.   Neurological: Negative.   Hematological: Negative.   Psychiatric/Behavioral: Negative.        Objective:   Physical Exam  Constitutional: She is oriented to person, place, and time. Vital signs are normal. She appears well-developed and well-nourished.  Non-toxic appearance. She does not have a sickly appearance. She does not appear ill. No distress.  Patient is alert and oriented and responsive to questions Engages in eye contact with provider. Speaks in full sentences without any pauses without any shortness of breath.    HENT:  Head: Normocephalic and atraumatic.  Right Ear: Hearing, tympanic membrane, external ear and ear canal normal.  Left Ear: Hearing, tympanic membrane, external ear and ear canal normal.  Nose: Nose normal. No mucosal edema or rhinorrhea. Right sinus  exhibits no maxillary sinus tenderness. Left sinus exhibits no maxillary sinus tenderness and no frontal sinus tenderness.  Mouth/Throat: Uvula is midline and mucous membranes are normal. No uvula swelling. Oropharyngeal exudate and posterior oropharyngeal erythema present. No posterior oropharyngeal edema or tonsillar abscesses.  Eyes: Pupils are equal, round, and reactive to light. Conjunctivae and EOM are normal. Right eye exhibits no discharge. Left eye exhibits no discharge. No scleral icterus.  Neck: Normal range of motion. Neck supple. No JVD present. No tracheal deviation present.  Cardiovascular: Normal rate, regular rhythm, normal heart sounds and intact distal pulses. Exam reveals no gallop and no friction rub.  No murmur heard. Pulmonary/Chest: Effort normal and breath sounds normal. No stridor. No respiratory distress. She has no wheezes. She has no rales. She exhibits no tenderness.  Abdominal: Soft. Bowel sounds are normal.  Musculoskeletal: Normal range of motion.  Patient moves on and off of exam table and in room without difficulty. Gait is normal in [place] and in room. Patient is oriented to person place time and situation. Patient answers questions appropriately and engages in conversation.   Lymphadenopathy:       Head (right side): No submental, no submandibular, no tonsillar, no preauricular, no posterior auricular and no occipital adenopathy present.       Head (left side): No submental, no submandibular, no tonsillar, no preauricular, no posterior auricular and no occipital adenopathy present.    She has no cervical adenopathy.  Neurological: She is alert and oriented to person, place, and time. She has normal strength. She displays normal reflexes. No cranial nerve deficit. She exhibits normal muscle tone. Coordination normal.  Skin:  Skin is warm and dry. No rash noted. She is not diaphoretic. No cyanosis or erythema. No pallor. Nails show no clubbing.  Psychiatric: She has a  normal mood and affect. Her behavior is normal. Judgment and thought content normal.  Vitals reviewed.      Assessment:     Sore throat - Plan: POCT Influenza A/B, POCT rapid strep A  Pharyngitis due to Streptococcus species  Strep throat  test positive.  Results for orders placed or performed in visit on 03/24/18 (from the past 24 hour(s))  POCT Influenza A/B     Status: Normal   Collection Time: 03/24/18 11:52 AM  Result Value Ref Range   Influenza A, POC Negative Negative   Influenza B, POC Negative Negative  POCT rapid strep A     Status: Abnormal   Collection Time: 03/24/18 11:53 AM  Result Value Ref Range   Rapid Strep A Screen Positive (A) Negative       Plan:       Meds ordered this encounter  Medications  . amoxicillin (AMOXIL) 875 MG tablet    Sig: Take 1 tablet (875 mg total) by mouth 2 (two) times daily.    Dispense:  20 tablet    Refill:  0  . predniSONE (STERAPRED UNI-PAK 21 TAB) 10 MG (21) TBPK tablet    Sig: By mouth Take 6 tablets on day 1, Take 5 tablets day 2 Take 4 tablets day 3 Take 3 tablets day 4 Take 2 tablets day five 5 Take 1 tablet day    Dispense:  21 tablet    Refill:  0   Reviewed AVS.   Advised patient call the office or your primary care doctor for an appointment if no improvement within 72 hours or if any symptoms change or worsen at any time  Advised ER or urgent Care if after hours or on weekend. Call 911 for emergency symptoms at any time.Patinet verbalized understanding of all instructions given/reviewed and treatment plan and has no further questions or concerns at this time.    Patient verbalized understanding of all instructions given and denies any further questions at this time.

## 2018-03-24 NOTE — Patient Instructions (Signed)
Amoxicillin capsules or tablets What is this medicine? AMOXICILLIN (a mox i SIL in) is a penicillin antibiotic. It is used to treat certain kinds of bacterial infections. It will not work for colds, flu, or other viral infections. This medicine may be used for other purposes; ask your health care provider or pharmacist if you have questions. COMMON BRAND NAME(S): Amoxil, Moxilin, Sumox, Trimox What should I tell my health care provider before I take this medicine? They need to know if you have any of these conditions: -asthma -kidney disease -an unusual or allergic reaction to amoxicillin, other penicillins, cephalosporin antibiotics, other medicines, foods, dyes, or preservatives -pregnant or trying to get pregnant -breast-feeding How should I use this medicine? Take this medicine by mouth with a glass of water. Follow the directions on your prescription label. You may take this medicine with food or on an empty stomach. Take your medicine at regular intervals. Do not take your medicine more often than directed. Take all of your medicine as directed even if you think your are better. Do not skip doses or stop your medicine early. Talk to your pediatrician regarding the use of this medicine in children. While this drug may be prescribed for selected conditions, precautions do apply. Overdosage: If you think you have taken too much of this medicine contact a poison control center or emergency room at once. NOTE: This medicine is only for you. Do not share this medicine with others. What if I miss a dose? If you miss a dose, take it as soon as you can. If it is almost time for your next dose, take only that dose. Do not take double or extra doses. What may interact with this medicine? -amiloride -birth control pills -chloramphenicol -macrolides -probenecid -sulfonamides -tetracyclines This list may not describe all possible interactions. Give your health care provider a list of all the  medicines, herbs, non-prescription drugs, or dietary supplements you use. Also tell them if you smoke, drink alcohol, or use illegal drugs. Some items may interact with your medicine. What should I watch for while using this medicine? Tell your doctor or health care professional if your symptoms do not improve in 2 or 3 days. Take all of the doses of your medicine as directed. Do not skip doses or stop your medicine early. If you are diabetic, you may get a false positive result for sugar in your urine with certain brands of urine tests. Check with your doctor. Do not treat diarrhea with over-the-counter products. Contact your doctor if you have diarrhea that lasts more than 2 days or if the diarrhea is severe and watery. What side effects may I notice from receiving this medicine? Side effects that you should report to your doctor or health care professional as soon as possible: -allergic reactions like skin rash, itching or hives, swelling of the face, lips, or tongue -breathing problems -dark urine -redness, blistering, peeling or loosening of the skin, including inside the mouth -seizures -severe or watery diarrhea -trouble passing urine or change in the amount of urine -unusual bleeding or bruising -unusually weak or tired -yellowing of the eyes or skin Side effects that usually do not require medical attention (report to your doctor or health care professional if they continue or are bothersome): -dizziness -headache -stomach upset -trouble sleeping This list may not describe all possible side effects. Call your doctor for medical advice about side effects. You may report side effects to FDA at 1-800-FDA-1088. Where should I keep my medicine? Keep out  of the reach of children. Store between 68 and 77 degrees F (20 and 25 degrees C). Keep bottle closed tightly. Throw away any unused medicine after the expiration date. NOTE: This sheet is a summary. It may not cover all possible  information. If you have questions about this medicine, talk to your doctor, pharmacist, or health care provider.  2018 Elsevier/Gold Standard (2008-02-24 14:10:59) Acetaminophen tablets or caplets What is this medicine? ACETAMINOPHEN (a set a MEE noe fen) is a pain reliever. It is used to treat mild pain and fever. This medicine may be used for other purposes; ask your health care provider or pharmacist if you have questions. COMMON BRAND NAME(S): Aceta, Actamin, Anacin Aspirin Free, Genapap, Genebs, Mapap, Pain & Fever, Pain and Fever, PAIN RELIEF, PAIN RELIEF Extra Strength, Pain Reliever, Panadol, PHARBETOL, Q-Pap, Q-Pap Extra Strength, Tylenol, Tylenol CrushableTablet, Tylenol Extra Strength, XS No Aspirin, XS Pain Reliever What should I tell my health care provider before I take this medicine? They need to know if you have any of these conditions: -if you often drink alcohol -liver disease -an unusual or allergic reaction to acetaminophen, other medicines, foods, dyes, or preservatives -pregnant or trying to get pregnant -breast-feeding How should I use this medicine? Take this medicine by mouth with a glass of water. Follow the directions on the package or prescription label. Take your medicine at regular intervals. Do not take your medicine more often than directed. Talk to your pediatrician regarding the use of this medicine in children. While this drug may be prescribed for children as young as 12 years of age for selected conditions, precautions do apply. Overdosage: If you think you have taken too much of this medicine contact a poison control center or emergency room at once. NOTE: This medicine is only for you. Do not share this medicine with others. What if I miss a dose? If you miss a dose, take it as soon as you can. If it is almost time for your next dose, take only that dose. Do not take double or extra doses. What may interact with this  medicine? -alcohol -imatinib -isoniazid -other medicines with acetaminophen This list may not describe all possible interactions. Give your health care provider a list of all the medicines, herbs, non-prescription drugs, or dietary supplements you use. Also tell them if you smoke, drink alcohol, or use illegal drugs. Some items may interact with your medicine. What should I watch for while using this medicine? Tell your doctor or health care professional if the pain lasts more than 10 days (5 days for children), if it gets worse, or if there is a new or different kind of pain. Also, check with your doctor if a fever lasts for more than 3 days. Do not take other medicines that contain acetaminophen with this medicine. Always read labels carefully. If you have questions, ask your doctor or pharmacist. If you take too much acetaminophen get medical help right away. Too much acetaminophen can be very dangerous and cause liver damage. Even if you do not have symptoms, it is important to get help right away. What side effects may I notice from receiving this medicine? Side effects that you should report to your doctor or health care professional as soon as possible: -allergic reactions like skin rash, itching or hives, swelling of the face, lips, or tongue -breathing problems -fever or sore throat -redness, blistering, peeling or loosening of the skin, including inside the mouth -trouble passing urine or change in the amount  of urine -unusual bleeding or bruising -unusually weak or tired -yellowing of the eyes or skin Side effects that usually do not require medical attention (report to your doctor or health care professional if they continue or are bothersome): -headache -nausea, stomach upset This list may not describe all possible side effects. Call your doctor for medical advice about side effects. You may report side effects to FDA at 1-800-FDA-1088. Where should I keep my medicine? Keep out  of reach of children. Store at room temperature between 20 and 25 degrees C (68 and 77 degrees F). Protect from moisture and heat. Throw away any unused medicine after the expiration date. NOTE: This sheet is a summary. It may not cover all possible information. If you have questions about this medicine, talk to your doctor, pharmacist, or health care provider.  2018 Elsevier/Gold Standard (2013-07-27 12:54:16) Prednisone tablets What is this medicine? PREDNISONE (PRED ni sone) is a corticosteroid. It is commonly used to treat inflammation of the skin, joints, lungs, and other organs. Common conditions treated include asthma, allergies, and arthritis. It is also used for other conditions, such as blood disorders and diseases of the adrenal glands. This medicine may be used for other purposes; ask your health care provider or pharmacist if you have questions. COMMON BRAND NAME(S): Deltasone, Predone, Sterapred, Sterapred DS What should I tell my health care provider before I take this medicine? They need to know if you have any of these conditions: -Cushing's syndrome -diabetes -glaucoma -heart disease -high blood pressure -infection (especially a virus infection such as chickenpox, cold sores, or herpes) -kidney disease -liver disease -mental illness -myasthenia gravis -osteoporosis -seizures -stomach or intestine problems -thyroid disease -an unusual or allergic reaction to lactose, prednisone, other medicines, foods, dyes, or preservatives -pregnant or trying to get pregnant -breast-feeding How should I use this medicine? Take this medicine by mouth with a glass of water. Follow the directions on the prescription label. Take this medicine with food. If you are taking this medicine once a day, take it in the morning. Do not take more medicine than you are told to take. Do not suddenly stop taking your medicine because you may develop a severe reaction. Your doctor will tell you how  much medicine to take. If your doctor wants you to stop the medicine, the dose may be slowly lowered over time to avoid any side effects. Talk to your pediatrician regarding the use of this medicine in children. Special care may be needed. Overdosage: If you think you have taken too much of this medicine contact a poison control center or emergency room at once. NOTE: This medicine is only for you. Do not share this medicine with others. What if I miss a dose? If you miss a dose, take it as soon as you can. If it is almost time for your next dose, talk to your doctor or health care professional. You may need to miss a dose or take an extra dose. Do not take double or extra doses without advice. What may interact with this medicine? Do not take this medicine with any of the following medications: -metyrapone -mifepristone This medicine may also interact with the following medications: -aminoglutethimide -amphotericin B -aspirin and aspirin-like medicines -barbiturates -certain medicines for diabetes, like glipizide or glyburide -cholestyramine -cholinesterase inhibitors -cyclosporine -digoxin -diuretics -ephedrine -female hormones, like estrogens and birth control pills -isoniazid -ketoconazole -NSAIDS, medicines for pain and inflammation, like ibuprofen or naproxen -phenytoin -rifampin -toxoids -vaccines -warfarin This list may not describe all  possible interactions. Give your health care provider a list of all the medicines, herbs, non-prescription drugs, or dietary supplements you use. Also tell them if you smoke, drink alcohol, or use illegal drugs. Some items may interact with your medicine. What should I watch for while using this medicine? Visit your doctor or health care professional for regular checks on your progress. If you are taking this medicine over a prolonged period, carry an identification card with your name and address, the type and dose of your medicine, and your  doctor's name and address. This medicine may increase your risk of getting an infection. Tell your doctor or health care professional if you are around anyone with measles or chickenpox, or if you develop sores or blisters that do not heal properly. If you are going to have surgery, tell your doctor or health care professional that you have taken this medicine within the last twelve months. Ask your doctor or health care professional about your diet. You may need to lower the amount of salt you eat. This medicine may affect blood sugar levels. If you have diabetes, check with your doctor or health care professional before you change your diet or the dose of your diabetic medicine. What side effects may I notice from receiving this medicine? Side effects that you should report to your doctor or health care professional as soon as possible: -allergic reactions like skin rash, itching or hives, swelling of the face, lips, or tongue -changes in emotions or moods -changes in vision -depressed mood -eye pain -fever or chills, cough, sore throat, pain or difficulty passing urine -increased thirst -swelling of ankles, feet Side effects that usually do not require medical attention (report to your doctor or health care professional if they continue or are bothersome): -confusion, excitement, restlessness -headache -nausea, vomiting -skin problems, acne, thin and shiny skin -trouble sleeping -weight gain This list may not describe all possible side effects. Call your doctor for medical advice about side effects. You may report side effects to FDA at 1-800-FDA-1088. Where should I keep my medicine? Keep out of the reach of children. Store at room temperature between 15 and 30 degrees C (59 and 86 degrees F). Protect from light. Keep container tightly closed. Throw away any unused medicine after the expiration date. NOTE: This sheet is a summary. It may not cover all possible information. If you have  questions about this medicine, talk to your doctor, pharmacist, or health care provider.  2018 Elsevier/Gold Standard (2011-07-19 10:57:14) Pharyngitis Pharyngitis is a sore throat (pharynx). There is redness, pain, and swelling of your throat. Follow these instructions at home:  Drink enough fluids to keep your pee (urine) clear or pale yellow.  Only take medicine as told by your doctor. ? You may get sick again if you do not take medicine as told. Finish your medicines, even if you start to feel better. ? Do not take aspirin.  Rest.  Rinse your mouth (gargle) with salt water ( tsp of salt per 1 qt of water) every 1-2 hours. This will help the pain.  If you are not at risk for choking, you can suck on hard candy or sore throat lozenges. Contact a doctor if:  You have large, tender lumps on your neck.  You have a rash.  You cough up green, yellow-brown, or bloody spit. Get help right away if:  You have a stiff neck.  You drool or cannot swallow liquids.  You throw up (vomit) or are not able  to keep medicine or liquids down.  You have very bad pain that does not go away with medicine.  You have problems breathing (not from a stuffy nose). This information is not intended to replace advice given to you by your health care provider. Make sure you discuss any questions you have with your health care provider. Document Released: 05/21/2008 Document Revised: 05/10/2016 Document Reviewed: 08/10/2013 Elsevier Interactive Patient Education  2017 [employer].

## 2018-03-26 ENCOUNTER — Telehealth: Payer: Self-pay | Admitting: Emergency Medicine

## 2018-03-26 NOTE — Telephone Encounter (Signed)
Spoke with patient informed me that she is doing well.

## 2018-07-03 ENCOUNTER — Ambulatory Visit (INDEPENDENT_AMBULATORY_CARE_PROVIDER_SITE_OTHER): Payer: No Typology Code available for payment source | Admitting: Internal Medicine

## 2018-07-03 ENCOUNTER — Encounter: Payer: Self-pay | Admitting: Internal Medicine

## 2018-07-03 VITALS — BP 122/80 | HR 70 | Temp 98.3°F | Ht 64.0 in | Wt 237.0 lb

## 2018-07-03 DIAGNOSIS — Z9109 Other allergy status, other than to drugs and biological substances: Secondary | ICD-10-CM | POA: Insufficient documentation

## 2018-07-03 DIAGNOSIS — E119 Type 2 diabetes mellitus without complications: Secondary | ICD-10-CM

## 2018-07-03 DIAGNOSIS — K219 Gastro-esophageal reflux disease without esophagitis: Secondary | ICD-10-CM | POA: Diagnosis not present

## 2018-07-03 DIAGNOSIS — Z0000 Encounter for general adult medical examination without abnormal findings: Secondary | ICD-10-CM

## 2018-07-03 LAB — CBC
HCT: 36.8 % (ref 36.0–46.0)
Hemoglobin: 11.8 g/dL — ABNORMAL LOW (ref 12.0–15.0)
MCHC: 32 g/dL (ref 30.0–36.0)
MCV: 70.1 fl — ABNORMAL LOW (ref 78.0–100.0)
Platelets: 412 10*3/uL — ABNORMAL HIGH (ref 150.0–400.0)
RBC: 5.24 Mil/uL — AB (ref 3.87–5.11)
RDW: 16.5 % — ABNORMAL HIGH (ref 11.5–15.5)
WBC: 5.7 10*3/uL (ref 4.0–10.5)

## 2018-07-03 LAB — LIPID PANEL
Cholesterol: 180 mg/dL (ref 0–200)
HDL: 47.7 mg/dL (ref >39.00)
LDL Cholesterol: 120 mg/dL — ABNORMAL HIGH (ref 0–99)
NONHDL: 132.43
Total CHOL/HDL Ratio: 4
Triglycerides: 64 mg/dL (ref 0.0–149.0)
VLDL: 12.8 mg/dL (ref 0.0–40.0)

## 2018-07-03 LAB — COMPREHENSIVE METABOLIC PANEL
ALBUMIN: 4.3 g/dL (ref 3.5–5.2)
ALK PHOS: 68 U/L (ref 39–117)
ALT: 28 U/L (ref 0–35)
AST: 23 U/L (ref 0–37)
BUN: 15 mg/dL (ref 6–23)
CO2: 30 mEq/L (ref 19–32)
CREATININE: 0.81 mg/dL (ref 0.40–1.20)
Calcium: 9.5 mg/dL (ref 8.4–10.5)
Chloride: 102 mEq/L (ref 96–112)
GFR: 98.93 mL/min (ref >60.00)
GLUCOSE: 104 mg/dL — AB (ref 70–99)
Potassium: 4 mEq/L (ref 3.5–5.1)
SODIUM: 138 meq/L (ref 135–145)
TOTAL PROTEIN: 7.1 g/dL (ref 6.0–8.3)
Total Bilirubin: 0.4 mg/dL (ref 0.2–1.2)

## 2018-07-03 LAB — MICROALBUMIN / CREATININE URINE RATIO
Creatinine,U: 199.4 mg/dL
MICROALB UR: 0.9 mg/dL (ref 0.0–1.9)
MICROALB/CREAT RATIO: 0.4 mg/g (ref 0.0–30.0)

## 2018-07-03 LAB — VITAMIN D 25 HYDROXY (VIT D DEFICIENCY, FRACTURES): VITD: 52.83 ng/mL (ref 30.00–100.00)

## 2018-07-03 LAB — HEMOGLOBIN A1C: HEMOGLOBIN A1C: 6.3 % (ref 4.6–6.5)

## 2018-07-03 LAB — TSH: TSH: 1.39 u[IU]/mL (ref 0.35–4.50)

## 2018-07-03 NOTE — Assessment & Plan Note (Signed)
Will try to decrease Pantoprazole to 20 mg daily (she will cut tabs in half and let me know how this is working for her) Discussed how weight loss can help reduce reflux

## 2018-07-03 NOTE — Assessment & Plan Note (Signed)
A1C and microalbumin today Encouraged her to consume a low carb diet and exercise for weight loss Encouraged yearly eye exams Foot exam today She declines pneumovax

## 2018-07-03 NOTE — Assessment & Plan Note (Signed)
Continue daily Claritin Referral to allergy placed

## 2018-07-03 NOTE — Patient Instructions (Signed)

## 2018-07-03 NOTE — Progress Notes (Signed)
Subjective:    Patient ID: Alejandra Watkins, female    DOB: [birth year], 42 y.o.   MRN: [id number]  HPI  Pt presents to the clinic today for her annual exam. She is also due to follow up chronic conditions.   GERD: She denies breakthrough on Pantoprazole. There is no UGI on file.  DM 2: Her last A1C was 6.4, 06/2017. She is not taking any diabetic medication at this time. She checks her feet daily. She gets eye exams yearly.  Allergies: Occurring all year long. Not sure what her triggers are. Taking Claritin daily. Would like a referral to allergist.  Flu: 09/2017 Tetanus: unsure Pneumovax: never Pap Smear:  08/2017 Mammogram: 08/2017 Vision Screening: annually Dentist: biannually  Diet: She does eat meat. She consumes more veggies than fruit. She is avoiding fried foods. She drinks mostly water, unsweet tea, Dt. Dr. Malachi Bonds. Exercise: None  Review of Systems  Past Medical History:  Diagnosis Date  . Acid reflux   . Diabetes mellitus without complication ([place])   . History of chicken pox     Current Outpatient Medications  Medication Sig Dispense Refill  . Cholecalciferol (VITAMIN D3) 5000 units CAPS Take 1 capsule by mouth daily.    Marland Kitchen desogestrel-ethinyl estradiol (JULEBER) 0.15-30 MG-MCG tablet Take by mouth.    Marland Kitchen glucose blood (BAYER CONTOUR NEXT TEST) test strip 1 each by Other route 2 (two) times daily as needed for other. Use as instructed 200 each 5  . Multiple Vitamins-Minerals (MULTIVITAMIN WITH MINERALS) tablet Take by mouth.    . Multiple Vitamins-Minerals (MULTIVITAMIN WOMEN) TABS Take 1 tablet by mouth daily.    . ondansetron (ZOFRAN) 4 MG tablet Take 1 tablet (4 mg total) by mouth every 8 (eight) hours as needed for nausea or vomiting. 20 tablet 0  . pantoprazole (PROTONIX) 40 MG tablet   3   No current facility-administered medications for this visit.     No Known Allergies  Family History  Problem Relation Age of Onset  . Hypertension Mother   .  Alcoholism Father   . Hypertension Paternal Grandmother   . Diabetes Paternal Grandmother   . Colon cancer Other   . Stroke Neg Hx   . Heart disease Neg Hx     Social History   Socioeconomic History  . Marital status: Married    Spouse name: Jori Moll  . Number of children: 3  . Years of education: Not on file  . Highest education level: Not on file  Occupational History  . 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[employer]  . Financial resource strain: Not on file  . Food insecurity:    Worry: Not on file    Inability: Not on file  . Transportation needs:    Medical: Not on file    Non-medical: Not on file  Tobacco Use  . Smoking status: Never Smoker  . Smokeless tobacco: Never Used  Substance and Sexual Activity  . Alcohol use: Yes    Alcohol/week: 0.6 oz    Types: 1 Glasses of wine per week    Comment: occasional  . Drug use: No  . Sexual activity: Yes    Birth control/protection: Surgical  Lifestyle  . Physical activity:    Days per week: Not on file    Minutes per session: Not on file  . Stress: Not on file  Relationships  . Social connections:    Talks on phone: Not on file  Gets together: Not on file    Attends religious service: Not on file    Active member of club or organization: Not on file    Attends meetings of clubs or organizations: Not on file    Relationship status: Not on file  . Intimate partner violence:    Fear of current or ex partner: Not on file    Emotionally abused: Not on file    Physically abused: Not on file    Forced sexual activity: Not on file  Other Topics Concern  . Not on file  Social History Narrative  . Not on file     Constitutional: Pt reports weight gain. Denies fever, malaise, fatigue, headache or abrupt weight changes.  HEENT: Denies eye pain, eye redness, ear pain, ringing in the ears, wax buildup, runny nose, nasal congestion, bloody nose, or sore throat. Respiratory: Denies difficulty  breathing, shortness of breath, cough or sputum production.   Cardiovascular: Denies chest pain, chest tightness, palpitations or swelling in the hands or feet.  Gastrointestinal: Denies abdominal pain, bloating, constipation, diarrhea or blood in the stool.  GU: Denies urgency, frequency, pain with urination, burning sensation, blood in urine, odor or discharge. Musculoskeletal: Denies decrease in range of motion, difficulty with gait, muscle pain or joint pain and swelling.  Skin: Denies redness, rashes, lesions or ulcercations.  Neurological: Denies dizziness, difficulty with memory, difficulty with speech or problems with balance and coordination.  Psych: Denies anxiety, depression, SI/HI.  No other specific complaints in a complete review of systems (except as listed in HPI above).     Objective:   Physical Exam   BP 122/80   Pulse 70   Temp 98.3 F (36.8 C) (Oral)   Ht 5\' 4"  (1.626 m)   Wt 237 lb (107.5 kg)   LMP  (LMP Unknown)   SpO2 98%   BMI 40.68 kg/m  Wt Readings from Last 3 Encounters:  07/03/18 237 lb (107.5 kg)  03/24/18 234 lb (106.1 kg)  12/09/17 235 lb 4 oz (106.7 kg)    General: Appears her stated age, obese in NAD. Skin: Warm, dry and intact. No rashes, lesions or ulcerations noted. HEENT: Head: normal shape and size; Eyes: sclera white, no icterus, conjunctiva pink, PERRLA and EOMs intact; Ears: Tm's gray and intact, normal light reflex; Throat/Mouth: Teeth present, mucosa pink and moist, no exudate, lesions or ulcerations noted.  Neck:  Neck supple, trachea midline. No masses, lumps or thyromegaly present.  Cardiovascular: Normal rate and rhythm. S1,S2 noted.  No murmur, rubs or gallops noted. No JVD or BLE edema.  Pulmonary/Chest: Normal effort and positive vesicular breath sounds. No respiratory distress. No wheezes, rales or ronchi noted.  Abdomen: Soft and nontender. Normal bowel sounds. No distention or masses noted. Liver, spleen and kidneys non  palpable. Musculoskeletal: Strength 5/5 BUE/BLE. No difficulty with gait.  Neurological: Alert and oriented. Cranial nerves II-XII grossly intact. Coordination normal.  Psychiatric: Mood and affect normal. Behavior is normal. Judgment and thought content normal.     BMET    Component Value Date/Time   NA 138 06/27/2017 0840   NA 141 01/07/2017 0926   NA 141 10/30/2014 1545   K 3.7 06/27/2017 0840   K 4.5 10/30/2014 1545   CL 105 06/27/2017 0840   CL 107 10/30/2014 1545   CO2 27 06/27/2017 0840   CO2 28 10/30/2014 1545   GLUCOSE 159 (H) 06/27/2017 0840   GLUCOSE 83 10/30/2014 1545   BUN 9 06/27/2017 0840  BUN 12 01/07/2017 0926   BUN 16 10/30/2014 1545   CREATININE 0.88 06/27/2017 0840   CREATININE 1.17 10/30/2014 1545   CALCIUM 9.4 06/27/2017 0840   CALCIUM 8.0 (L) 10/30/2014 1545   GFRNONAA 93 01/07/2017 0926   GFRNONAA 54 (L) 10/30/2014 1545   GFRNONAA >60 05/08/2012 1158   GFRAA 107 01/07/2017 0926   GFRAA >60 10/30/2014 1545   GFRAA >60 05/08/2012 1158    Lipid Panel     Component Value Date/Time   CHOL 194 06/27/2017 0840   CHOL 219 (H) 01/07/2017 0926   TRIG 121.0 06/27/2017 0840   HDL 50.90 06/27/2017 0840   HDL 53 01/07/2017 0926   CHOLHDL 4 06/27/2017 0840   VLDL 24.2 06/27/2017 0840   LDLCALC 119 (H) 06/27/2017 0840   LDLCALC 141 (H) 01/07/2017 0926    CBC    Component Value Date/Time   WBC 6.7 06/27/2017 0840   RBC 5.18 (H) 06/27/2017 0840   HGB 11.3 (L) 06/27/2017 0840   HGB 10.9 (L) 01/07/2017 1518   HCT 35.8 (L) 06/27/2017 0840   HCT 37.2 01/07/2017 1518   PLT 404.0 (H) 06/27/2017 0840   PLT 409 (H) 01/07/2017 1518   MCV 69.0 Repeated and verified X2. (L) 06/27/2017 0840   MCV 70 (L) 01/07/2017 1518   MCV 72 (L) 10/30/2014 1545   MCH 20.6 (L) 01/07/2017 1518   MCH 23.3 (L) 07/23/2016 2117   MCHC 31.6 06/27/2017 0840   RDW 16.2 (H) 06/27/2017 0840   RDW 17.5 (H) 01/07/2017 1518   RDW 15.5 (H) 10/30/2014 1545   LYMPHSABS 2.9  01/07/2017 1518   LYMPHSABS 2.6 10/30/2014 1545   MONOABS 1.1 (H) 07/23/2016 2117   MONOABS 0.8 10/30/2014 1545   EOSABS 0.2 01/07/2017 1518   EOSABS 0.1 10/30/2014 1545   BASOSABS 0.0 01/07/2017 1518   BASOSABS 0.1 10/30/2014 1545    Hgb A1C Lab Results  Component Value Date   HGBA1C 6.4 06/27/2017           Assessment & Plan:   Preventative Health Maintenance:  Encouraged her to get a flu shot in the fall She declines tetanus or pneumovax today Pap smear UTD She will call to schedule mammogram for 08/2018 Encouraged her to consume a balanced diet and exercise regimen Advised her to see an eye doctor and dentist annually Will check CBC, CMET, TSH, Lipid, A1C, microalbumin and Vit D today.  RTC in 1 year, sooner if needed Webb Silversmith, NP

## 2018-09-22 ENCOUNTER — Other Ambulatory Visit: Payer: Self-pay | Admitting: Obstetrics and Gynecology

## 2018-09-22 DIAGNOSIS — Z1231 Encounter for screening mammogram for malignant neoplasm of breast: Secondary | ICD-10-CM

## 2018-10-23 ENCOUNTER — Ambulatory Visit
Admission: RE | Admit: 2018-10-23 | Discharge: 2018-10-23 | Disposition: A | Discharge status: Home / Self Care | Payer: No Typology Code available for payment source | Source: Ambulatory Visit | Attending: Obstetrics and Gynecology | Admitting: Obstetrics and Gynecology

## 2018-10-23 ENCOUNTER — Encounter: Payer: Self-pay | Admitting: Obstetrics and Gynecology

## 2018-10-23 DIAGNOSIS — Z1231 Encounter for screening mammogram for malignant neoplasm of breast: Secondary | ICD-10-CM | POA: Insufficient documentation

## 2019-01-14 ENCOUNTER — Encounter: Payer: Self-pay | Admitting: Obstetrics and Gynecology

## 2019-01-14 ENCOUNTER — Ambulatory Visit (INDEPENDENT_AMBULATORY_CARE_PROVIDER_SITE_OTHER): Payer: No Typology Code available for payment source | Admitting: Obstetrics and Gynecology

## 2019-01-14 VITALS — BP 124/90 | HR 81 | Ht 64.0 in | Wt 246.0 lb

## 2019-01-14 DIAGNOSIS — Z01419 Encounter for gynecological examination (general) (routine) without abnormal findings: Secondary | ICD-10-CM

## 2019-01-14 DIAGNOSIS — Z3041 Encounter for surveillance of contraceptive pills: Secondary | ICD-10-CM

## 2019-01-14 DIAGNOSIS — Z1239 Encounter for other screening for malignant neoplasm of breast: Secondary | ICD-10-CM

## 2019-01-14 MED ORDER — LEVONORGESTREL-ETHINYL ESTRAD 0.1-20 MG-MCG PO TABS: 1.0000 | ORAL_TABLET | Freq: Every day | ORAL | 3 refills | Status: DC

## 2019-01-14 NOTE — Patient Instructions (Signed)
I value your feedback and entrusting us with your care. If you get a [facility] patient survey, I would appreciate you taking the time to let us know about your experience today. Thank you! 

## 2019-01-14 NOTE — Progress Notes (Signed)
PCP:  [name], NP   Chief Complaint  Patient presents with  . Gynecologic Exam     HPI:      [name] is a 43 y.o. G3P3000 who LMP was No LMP recorded (lmp unknown). Patient has had a hysterectomy., presents today for her annual examination.  Her menses are absent due to lap supracx hyst 2013 due to [place]. She does not have intermenstrual bleeding. She has occas night sweats. Pt on OCPs for hx of recurrent ovar cysts, started by [facility] a couple yrs ago. Pt is high risk for surgery. States she has been taking pills off and on and still getting cysts. She recently decided to take them correctly and has had bad breast tenderness. Sx resolved once stopping pills.   Sex activity: single partner, contraception - status post hysterectomy.  Last Pap: 08/27/17  Results were: no abnormalities /neg HPV DNA  Hx of STDs: none  Last mammogram: 10/23/18  Results: no abnormalities, repeat in a 1 yr There is no FH of breast cancer. There is no FH of ovarian cancer. The patient does do self-breast exams.  Tobacco use: The patient denies current or previous tobacco use. Alcohol use: social drinker No drug use.  Exercise: not active  She does get adequate calcium and Vitamin D in her diet.  Labs with PCP.   Past Medical History:  Diagnosis Date  . Acid reflux   . Anemia   . Diabetes mellitus without complication ([place])   . Family history of colon cancer   . History of chicken pox   . Leiomyoma   . Ovarian cyst    seen at [facility]    Past Surgical History:  Procedure Laterality Date  . ABDOMINAL HYSTERECTOMY     partial  . BREAST BIOPSY Left 15 yrs ago   needle biopsy. Benign  . OVARIAN CYST REMOVAL Left   . TUBAL LIGATION      Family History  Problem Relation Age of Onset  . Hypertension Mother   . Alcoholism Father   . Hypertension Paternal Grandmother   . Diabetes Paternal Grandmother        type 2  . Colon cancer Other   . Diabetes Other        type 2  .  Hypertension Other   . Diabetes Other        type 2  . Stroke Other   . Lung cancer Paternal Uncle   . Heart disease Neg Hx   . Breast cancer Neg Hx     Social History   Socioeconomic History  . Marital status: Married    Spouse name: Jori Moll  . Number of children: 3  . Years of education: Not on file  . Highest education level: Not on file  Occupational History  . 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[employer]  . Financial resource strain: Not on file  . Food insecurity:    Worry: Not on file    Inability: Not on file  . Transportation needs:    Medical: Not on file    Non-medical: Not on file  Tobacco Use  . Smoking status: Never Smoker  . Smokeless tobacco: Never Used  Substance and Sexual Activity  . Alcohol use: Yes    Alcohol/week: 1.0 standard drinks    Types: 1 Glasses of wine per week    Comment: occasional  . Drug use: No  . Sexual activity: Yes  Birth control/protection: Surgical    Comment: Hysterectomy  Lifestyle  . Physical activity:    Days per week: Not on file    Minutes per session: Not on file  . Stress: Not on file  Relationships  . Social connections:    Talks on phone: Not on file    Gets together: Not on file    Attends religious service: Not on file    Active member of club or organization: Not on file    Attends meetings of clubs or organizations: Not on file    Relationship status: Not on file  . Intimate partner violence:    Fear of current or ex partner: Not on file    Emotionally abused: Not on file    Physically abused: Not on file    Forced sexual activity: Not on file  Other Topics Concern  . Not on file  Social History Narrative  . Not on file    Current Meds  Medication Sig  . Cholecalciferol (VITAMIN D3) 5000 units CAPS Take 1 capsule by mouth daily.  Marland Kitchen glucose blood (BAYER CONTOUR NEXT TEST) test strip 1 each by Other route 2 (two) times daily as needed for other. Use as instructed  .  levocetirizine (XYZAL) 5 MG tablet   . montelukast (SINGULAIR) 10 MG tablet   . Multiple Vitamins-Minerals (MULTIVITAMIN WOMEN) TABS Take 1 tablet by mouth daily.  . pantoprazole (PROTONIX) 40 MG tablet Take by mouth daily. Half a tablet  . [DISCONTINUED] desogestrel-ethinyl estradiol (JULEBER) 0.15-30 MG-MCG tablet Take by mouth.     ROS:  Review of Systems  Constitutional: Negative for fatigue, fever and unexpected weight change.  Respiratory: Negative for cough, shortness of breath and wheezing.   Cardiovascular: Negative for chest pain, palpitations and leg swelling.  Gastrointestinal: Negative for blood in stool, constipation, diarrhea, nausea and vomiting.  Endocrine: Negative for cold intolerance, heat intolerance and polyuria.  Genitourinary: Negative for dyspareunia, dysuria, flank pain, frequency, genital sores, hematuria, menstrual problem, pelvic pain, urgency, vaginal bleeding, vaginal discharge and vaginal pain.  Musculoskeletal: Negative for back pain, joint swelling and myalgias.  Skin: Negative for rash.  Neurological: Negative for dizziness, syncope, light-headedness, numbness and headaches.  Hematological: Negative for adenopathy.  Psychiatric/Behavioral: Negative for agitation, confusion, sleep disturbance and suicidal ideas. The patient is not nervous/anxious.      Objective: BP 124/90   Pulse 81   Ht 5\' 4"  (1.626 m)   Wt 246 lb (111.6 kg)   LMP  (LMP Unknown)   BMI 42.23 kg/m    Physical Exam Constitutional:      Appearance: She is well-developed.  Genitourinary:     Vulva, vagina, cervix, right adnexa and left adnexa normal.     No vaginal discharge, erythema or tenderness.     No cervical polyp.     Uterus is absent.     No right or left adnexal mass present.     Right adnexa not tender.     Left adnexa not tender.     Genitourinary Comments: UTERUS SURG ABSENT  Neck:     Musculoskeletal: Normal range of motion.     Thyroid: No thyromegaly.    Cardiovascular:     Rate and Rhythm: Normal rate and regular rhythm.     Heart sounds: Normal heart sounds. No murmur.  Pulmonary:     Effort: Pulmonary effort is normal.     Breath sounds: Normal breath sounds.  Chest:     Breasts:  Right: No mass, nipple discharge, skin change or tenderness.        Left: No mass, nipple discharge, skin change or tenderness.  Abdominal:     Palpations: Abdomen is soft.     Tenderness: There is no abdominal tenderness. There is no guarding.  Musculoskeletal: Normal range of motion.  Neurological:     Mental Status: She is alert and oriented to person, place, and time.     Cranial Nerves: No cranial nerve deficit.  Psychiatric:        Behavior: Behavior normal.  Vitals signs reviewed.     Assessment/Plan: Encounter for annual routine gynecological examination  Screening for breast cancer - Pt current on mammo  Encounter for surveillance of contraceptive pills - Pt on OCPs for recurrent ovar cysts, per [facility]. Pt complains of breast tenderness with pills. Will change to lower dose to see if controls sx.  - Plan: levonorgestrel-ethinyl estradiol (AVIANE) 0.1-20 MG-MCG tablet  Meds ordered this encounter  Medications  . levonorgestrel-ethinyl estradiol (AVIANE) 0.1-20 MG-MCG tablet    Sig: Take 1 tablet by mouth daily.    Dispense:  84 tablet    Refill:  3    Order Specific Question:   Supervising Provider    Answer:   Gae Dry [[id number]]             GYN counsel breast self exam, mammography screening, adequate intake of calcium and vitamin D, diet and exercise     F/U  Return in about 1 year (around 01/15/2020).  Alicia B. Copland, PA-C 01/14/2019 11:46 AM

## 2019-01-29 ENCOUNTER — Ambulatory Visit (INDEPENDENT_AMBULATORY_CARE_PROVIDER_SITE_OTHER): Payer: No Typology Code available for payment source | Admitting: Internal Medicine

## 2019-01-29 ENCOUNTER — Encounter: Payer: Self-pay | Admitting: Internal Medicine

## 2019-01-29 VITALS — BP 124/84 | HR 80 | Temp 98.0°F | Wt 248.0 lb

## 2019-01-29 DIAGNOSIS — M5442 Lumbago with sciatica, left side: Secondary | ICD-10-CM | POA: Diagnosis not present

## 2019-01-29 DIAGNOSIS — R1032 Left lower quadrant pain: Secondary | ICD-10-CM

## 2019-01-29 LAB — POC URINALSYSI DIPSTICK (AUTOMATED)
BILIRUBIN UA: NEGATIVE
GLUCOSE UA: NEGATIVE
Ketones, UA: NEGATIVE
Leukocytes, UA: NEGATIVE
Nitrite, UA: NEGATIVE
PH UA: 6 (ref 5.0–8.0)
Protein, UA: NEGATIVE
SPEC GRAV UA: 1.025 (ref 1.010–1.025)
UROBILINOGEN UA: 0.2 U/dL

## 2019-01-29 MED ORDER — ORPHENADRINE CITRATE ER 100 MG PO TB12: 100.0000 mg | ORAL_TABLET | Freq: Two times a day (BID) | ORAL | 0 refills | Status: DC

## 2019-01-29 MED ORDER — PREDNISONE 10 MG PO TABS: ORAL_TABLET | ORAL | 0 refills | Status: DC

## 2019-01-30 LAB — URINE CULTURE
MICRO NUMBER:: 191865
Result:: NO GROWTH
SPECIMEN QUALITY:: ADEQUATE

## 2019-02-03 ENCOUNTER — Encounter: Payer: Self-pay | Admitting: Internal Medicine

## 2019-02-03 NOTE — Patient Instructions (Signed)
Sciatica    Sciatica is pain, numbness, weakness, or tingling along your sciatic nerve. The sciatic nerve starts in the lower back and goes down the back of each leg. Sciatica happens when this nerve is pinched or has pressure put on it. Sciatica usually goes away on its own or with treatment. Sometimes, sciatica may keep coming back (recur).  Follow these instructions at home:  Medicines  · Take over-the-counter and prescription medicines only as told by your doctor.  · Do not drive or use heavy machinery while taking prescription pain medicine.  Managing pain  · If directed, put ice on the affected area.  ? Put ice in a plastic bag.  ? Place a towel between your skin and the bag.  ? Leave the ice on for 20 minutes, 2-3 times a day.  · After icing, apply heat to the affected area before you exercise or as often as told by your doctor. Use the heat source that your doctor tells you to use, such as a moist heat pack or a heating pad.  ? Place a towel between your skin and the heat source.  ? Leave the heat on for 20-30 minutes.  ? Remove the heat if your skin turns bright red. This is especially important if you are unable to feel pain, heat, or cold. You may have a greater risk of getting burned.  Activity  · Return to your normal activities as told by your doctor. Ask your doctor what activities are safe for you.  ? Avoid activities that make your sciatica worse.  · Take short rests during the day. Rest in a lying or standing position. This is usually better than sitting to rest.  ? When you rest for a long time, do some physical activity or stretching between periods of rest.  ? Avoid sitting for a long time without moving. Get up and move around at least one time each hour.  · Exercise and stretch regularly, as told by your doctor.  · Do not lift anything that is heavier than 10 lb (4.5 kg) while you have symptoms of sciatica.  ? Avoid lifting heavy things even when you do not have symptoms.  ? Avoid lifting  heavy things over and over.  · When you lift objects, always lift in a way that is safe for your body. To do this, you should:  ? Bend your knees.  ? Keep the object close to your body.  ? Avoid twisting.  General instructions  · Use good posture.  ? Avoid leaning forward when you are sitting.  ? Avoid hunching over when you are standing.  · Stay at a healthy weight.  · Wear comfortable shoes that support your feet. Avoid wearing high heels.  · Avoid sleeping on a mattress that is too soft or too hard. You might have less pain if you sleep on a mattress that is firm enough to support your back.  · Keep all follow-up visits as told by your doctor. This is important.  Contact a doctor if:  · You have pain that:  ? Wakes you up when you are sleeping.  ? Gets worse when you lie down.  ? Is worse than the pain you have had in the past.  ? Lasts longer than 4 weeks.  · You lose weight for without trying.  Get help right away if:  · You cannot control when you pee (urinate) or poop (have a bowel movement).  · You   have weakness in any of these areas and it gets worse.  ? Lower back.  ? Lower belly (pelvis).  ? Butt (buttocks).  ? Legs.  · You have redness or swelling of your back.  · You have a burning feeling when you pee.  This information is not intended to replace advice given to you by your health care provider. Make sure you discuss any questions you have with your health care provider.  Document Released: 09/11/2008 Document Revised: 05/10/2016 Document Reviewed: 08/12/2015  Elsevier Interactive Patient Education © 2019 Elsevier Inc.

## 2019-02-03 NOTE — Progress Notes (Signed)
Subjective:    Patient ID: Alejandra Watkins, female    DOB: 07/19/1976, 43 y.o.   MRN: [id number]  HPI  Patient presents to the clinic today with complaint plan left lower back pain.  She reports started about 1 week ago.  She describes the pain as achy and throbbing.  The pain radiates into her left leg.  She reports some associated numbness and tingling in the left leg, but denies weakness.  She has had some associated nausea and left lower quadrant abdominal pain but denies vomiting, constipation, diarrhea or blood in her stools.  She has had an ovarian cyst in the past that felt similar.  She denies any injury to the back that she is aware of.  She denies urinary or vaginal complaint.  She has tried Aleve, ibuprofen, Toradol with minimal relief.  Review of Systems  Past Medical History:  Diagnosis Date  . Acid reflux   . Anemia   . Diabetes mellitus without complication ([place])   . Family history of colon cancer   . History of chicken pox   . Leiomyoma   . Ovarian cyst    seen at [facility]    Current Outpatient Medications  Medication Sig Dispense Refill  . Cholecalciferol (VITAMIN D3) 5000 units CAPS Take 1 capsule by mouth daily.    Marland Kitchen glucose blood (BAYER CONTOUR NEXT TEST) test strip 1 each by Other route 2 (two) times daily as needed for other. Use as instructed 200 each 5  . levocetirizine (XYZAL) 5 MG tablet     . levonorgestrel-ethinyl estradiol (AVIANE) 0.1-20 MG-MCG tablet Take 1 tablet by mouth daily. 84 tablet 3  . montelukast (SINGULAIR) 10 MG tablet     . Multiple Vitamins-Minerals (MULTIVITAMIN WOMEN) TABS Take 1 tablet by mouth daily.    . pantoprazole (PROTONIX) 40 MG tablet Take by mouth daily. Half a tablet  3  . orphenadrine (NORFLEX) 100 MG tablet Take 1 tablet (100 mg total) by mouth 2 (two) times daily. 20 tablet 0  . predniSONE (DELTASONE) 10 MG tablet Take 6 tabs day 1, 5 tabs day 2, 4 tabs day 3, 3 tabs day 4, 2 tabs day 5, 1 tab day 6 21 tablet 0   No current  facility-administered medications for this visit.     No Known Allergies  Family History  Problem Relation Age of Onset  . Hypertension Mother   . Alcoholism Father   . Hypertension Paternal Grandmother   . Diabetes Paternal Grandmother        type 2  . Colon cancer Other   . Diabetes Other        type 2  . Hypertension Other   . Diabetes Other        type 2  . Stroke Other   . Lung cancer Paternal Uncle   . Heart disease Neg Hx   . Breast cancer Neg Hx     Social History   Socioeconomic History  . Marital status: Married    Spouse name: Jori Moll  . Number of children: 3  . Years of education: Not on file  . Highest education level: Not on file  Occupational History  . 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 Financial resource strain: Not on file  . Food insecurity:    Worry: Not on file    Inability: Not on file  . Transportation needs:    Medical: Not on file  Non-medical: Not on file  Tobacco Use  . Smoking status: Never Smoker  . Smokeless tobacco: Never Used  Substance and Sexual Activity  . Alcohol use: Yes    Alcohol/week: 1.0 standard drinks    Types: 1 Glasses of wine per week    Comment: occasional  . Drug use: No  . Sexual activity: Yes    Birth control/protection: Surgical    Comment: Hysterectomy  Lifestyle  . Physical activity:    Days per week: Not on file    Minutes per session: Not on file  . Stress: Not on file  Relationships  . Social connections:    Talks on phone: Not on file    Gets together: Not on file    Attends religious service: Not on file    Active member of club or organization: Not on file    Attends meetings of clubs or organizations: Not on file    Relationship status: Not on file  . Intimate partner violence:    Fear of current or ex partner: Not on file    Emotionally abused: Not on file    Physically abused: Not on file    Forced sexual activity: Not on file  Other Topics  Concern  . Not on file  Social History Narrative  . Not on file     Constitutional: Denies fever, malaise, fatigue, headache or abrupt weight changes.  Respiratory: Denies difficulty breathing, shortness of breath, cough or sputum production.   Cardiovascular: Denies chest pain, chest tightness, palpitations or swelling in the hands or feet.  Gastrointestinal: Patient reports nausea, abdominal pain.  Denies bloating, constipation, diarrhea or blood in the stool.  GU: Denies urgency, frequency, pain with urination, burning sensation, blood in urine, odor or discharge. Musculoskeletal: Patient reports left lower back pain, left leg pain.  Denies decrease in range of motion, difficulty with gait, or joint and swelling.  Skin: Denies redness, rashes, lesions or ulcercations.  Neurological: Patient reports numbness and tingling in the left leg.  Denies dizziness, difficulty with memory, difficulty with speech or problems with balance and coordination.    No other specific complaints in a complete review of systems (except as listed in HPI above).\    Objective:   Physical Exam    BP 124/84   Pulse 80   Temp 98 F (36.7 C) (Oral)   Wt 248 lb (112.5 kg)   LMP  (LMP Unknown)   SpO2 98%   BMI 42.57 kg/m  Wt Readings from Last 3 Encounters:  01/29/19 248 lb (112.5 kg)  01/14/19 246 lb (111.6 kg)  07/03/18 237 lb (107.5 kg)    General: Appears her stated age, obese, in NAD. Cardiovascular: Normal rate and rhythm.  Pulmonary/Chest: Normal effort and positive vesicular breath sounds. No respiratory distress. No wheezes, rales or ronchi noted.  Abdomen: Soft and nontender. Normal bowel sounds. No distention or masses noted.  No CVA tenderness. Musculoskeletal: Pain with flexion of the spine.  Normal extension and rotation. Pain with palpation over the lumbar spine. Normal abduction, adduction, internal and external rotation of the left hip. No pain with palpation of the left hip.  Strength 4/5 LLE, 5/5 RLE. Neurological: Alert and oriented. Positive SLR on the left.   BMET    Component Value Date/Time   NA 138 07/03/2018 0837   NA 141 01/07/2017 0926   NA 141 10/30/2014 1545   K 4.0 07/03/2018 0837   K 4.5 10/30/2014 1545   CL 102 07/03/2018 1950  CL 107 10/30/2014 1545   CO2 30 07/03/2018 0837   CO2 28 10/30/2014 1545   GLUCOSE 104 (H) 07/03/2018 0837   GLUCOSE 83 10/30/2014 1545   BUN 15 07/03/2018 0837   BUN 12 01/07/2017 0926   BUN 16 10/30/2014 1545   CREATININE 0.81 07/03/2018 0837   CREATININE 1.17 10/30/2014 1545   CALCIUM 9.5 07/03/2018 0837   CALCIUM 8.0 (L) 10/30/2014 1545   GFRNONAA 93 01/07/2017 0926   GFRNONAA 54 (L) 10/30/2014 1545   GFRNONAA >60 05/08/2012 1158   GFRAA 107 01/07/2017 0926   GFRAA >60 10/30/2014 1545   GFRAA >60 05/08/2012 1158    Lipid Panel     Component Value Date/Time   CHOL 180 07/03/2018 0837   CHOL 219 (H) 01/07/2017 0926   TRIG 64.0 07/03/2018 0837   HDL 47.70 07/03/2018 0837   HDL 53 01/07/2017 0926   CHOLHDL 4 07/03/2018 0837   VLDL 12.8 07/03/2018 0837   LDLCALC 120 (H) 07/03/2018 0837   LDLCALC 141 (H) 01/07/2017 0926    CBC    Component Value Date/Time   WBC 5.7 07/03/2018 0837   RBC 5.24 (H) 07/03/2018 0837   HGB 11.8 (L) 07/03/2018 0837   HGB 10.9 (L) 01/07/2017 1518   HCT 36.8 07/03/2018 0837   HCT 37.2 01/07/2017 1518   PLT 412.0 (H) 07/03/2018 0837   PLT 409 (H) 01/07/2017 1518   MCV 70.1 (L) 07/03/2018 0837   MCV 70 (L) 01/07/2017 1518   MCV 72 (L) 10/30/2014 1545   MCH 20.6 (L) 01/07/2017 1518   MCH 23.3 (L) 07/23/2016 2117   MCHC 32.0 07/03/2018 0837   RDW 16.5 (H) 07/03/2018 0837   RDW 17.5 (H) 01/07/2017 1518   RDW 15.5 (H) 10/30/2014 1545   LYMPHSABS 2.9 01/07/2017 1518   LYMPHSABS 2.6 10/30/2014 1545   MONOABS 1.1 (H) 07/23/2016 2117   MONOABS 0.8 10/30/2014 1545   EOSABS 0.2 01/07/2017 1518   EOSABS 0.1 10/30/2014 1545   BASOSABS 0.0 01/07/2017 1518   BASOSABS  0.1 10/30/2014 1545    Hgb A1C Lab Results  Component Value Date   HGBA1C 6.3 07/03/2018          Assessment & Plan:   Acute Left Sided Low Back Pain with Left Sided Sciatica:  RX for Pred Taper x 9 days RX for Norflex 100 mg BID- sedation caution given Heat and massage may be helpful Stretching exercises given Consider PT if no improvement Urinalysis: 3+ blood Will send urine culture  Return precautions discussed Webb Silversmith, NP

## 2019-03-08 ENCOUNTER — Telehealth: Payer: No Typology Code available for payment source | Admitting: Physician Assistant

## 2019-03-08 ENCOUNTER — Encounter: Payer: Self-pay | Admitting: Physician Assistant

## 2019-03-08 DIAGNOSIS — R6889 Other general symptoms and signs: Secondary | ICD-10-CM

## 2019-03-08 MED ORDER — BENZONATATE 100 MG PO CAPS: 100.0000 mg | ORAL_CAPSULE | Freq: Three times a day (TID) | ORAL | 0 refills | Status: DC | PRN

## 2019-03-08 NOTE — Progress Notes (Signed)
E-Visit for Corona Virus Screening  Based on your current symptoms, you may very well have the virus, however your symptoms are mild. Currently, not all patients are being tested. If the symptoms are mild and there is not a known exposure, performing the test is not indicated.  Coronavirus disease 2019 (COVID-19) is a respiratory illness that can spread from person to person. The virus that causes COVID-19 is a new virus that was first identified in the country of [place] but is now found in multiple other countries and has spread to the [place].  Symptoms associated with the virus are mild to severe fever, cough, and shortness of breath. There is currently no vaccine to protect against COVID-19, and there is no specific antiviral treatment for the virus.   To be considered HIGH RISK for Coronavirus (COVID-19), you have to meet the following criteria:  . Traveled to [place], [place], [place], [place] or [place]; or in the [place] to [place], [place], [place], or [place]; and have fever, cough, and shortness of breath within the last 2 weeks of travel OR  . Been in close contact with a person diagnosed with COVID-19 within the last 2 weeks and have fever, cough, and shortness of breath  . IF YOU DO NOT MEET THESE CRITERIA, YOU ARE CONSIDERED LOW RISK FOR COVID-19.   It is vitally important that if you feel that you have an infection such as this virus or any other virus that you stay home and away from places where you may spread it to others.  You should self-quarantine for 14 days if you have symptoms that could potentially be coronavirus and avoid contact with people age 21 and older.   You can use medication such as A prescription cough medication called Tessalon Perles 100 mg. You may take 1-2 capsules every 8 hours as needed for cough  I have also provided a work note for the next 2 weeks.  You may also take acetaminophen (Tylenol) as needed for fever.   Reduce your risk of  any infection by using the same precautions used for avoiding the common cold or flu:  Marland Kitchen Wash your hands often with soap and warm water for at least 20 seconds.  If soap and water are not readily available, use an alcohol-based hand sanitizer with at least 60% alcohol.  . If coughing or sneezing, cover your mouth and nose by coughing or sneezing into the elbow areas of your shirt or coat, into a tissue or into your sleeve (not your hands). . Avoid shaking hands with others and consider head nods or verbal greetings only. . Avoid touching your eyes, nose, or mouth with unwashed hands.  . Avoid close contact with people who are sick. . Avoid places or events with large numbers of people in one location, like concerts or sporting events. . Carefully consider travel plans you have or are making. . If you are planning any travel outside or inside the [place], visit the CDC's Travelers' Health webpage for the latest health notices. . If you have some symptoms but not all symptoms, continue to monitor at home and seek medical attention if your symptoms worsen. . If you are having a medical emergency, call 911.  HOME CARE . Only take medications as instructed by your medical team. . Drink plenty of fluids and get plenty of rest. . A steam or ultrasonic humidifier can help if you have congestion.   GET HELP RIGHT AWAY IF: .  You develop worsening fever. . You become short of breath . You cough up blood. . Your symptoms become more severe MAKE SURE YOU   Understand these instructions.  Will watch your condition.  Will get help right away if you are not doing well or get worse.  Your e-visit answers were reviewed by a board certified advanced clinical practitioner to complete your personal care plan.  Depending on the condition, your plan could have included both over the counter or prescription medications.  If there is a problem please reply once you have received a response from your provider. Your  safety is important to [place].  If you have drug allergies check your prescription carefully.    You can use MyChart to ask questions about today's visit, request a non-urgent call back, or ask for a work or school excuse for 24 hours related to this e-Visit. If it has been greater than 24 hours you will need to follow up with your provider, or enter a new e-Visit to address those concerns. You will get an e-mail in the next two days asking about your experience.  I hope that your e-visit has been valuable and will speed your recovery. Thank you for using e-visits.   I have spent 7 min in completion and review of this note- Lacy Duverney [facility]

## 2019-03-16 ENCOUNTER — Encounter: Payer: Self-pay | Admitting: Internal Medicine

## 2019-03-17 MED ORDER — GLUCOSE BLOOD VI STRP: 1.0000 | ORAL_STRIP | Freq: Two times a day (BID) | 2 refills | Status: DC | PRN

## 2019-05-30 IMAGING — US US TRANSVAGINAL NON-OB
1 series · 13 of 25 positions shown · non-contrast
Comparison: Prior ultrasound from 09/16/2013 and CT from
07/23/2016.

CLINICAL DATA: Initial evaluation for left-sided pelvic pain, now
resolved. History of prior hysterectomy.

EXAM:
TRANSABDOMINAL AND TRANSVAGINAL ULTRASOUND OF PELVIS
TECHNIQUE: Both transabdominal and transvaginal ultrasound examinations of the
pelvis were performed. Transabdominal technique was performed for
global imaging of the pelvis including uterus, ovaries, adnexal
regions, and pelvic cul-de-sac. It was necessary to proceed with
endovaginal exam following the transabdominal exam to visualize the
pelvic structures.

[Series 1: us transvaginal non-ob · 0.23mm/px · 13 of 38 slices shown]
[im 1/38]
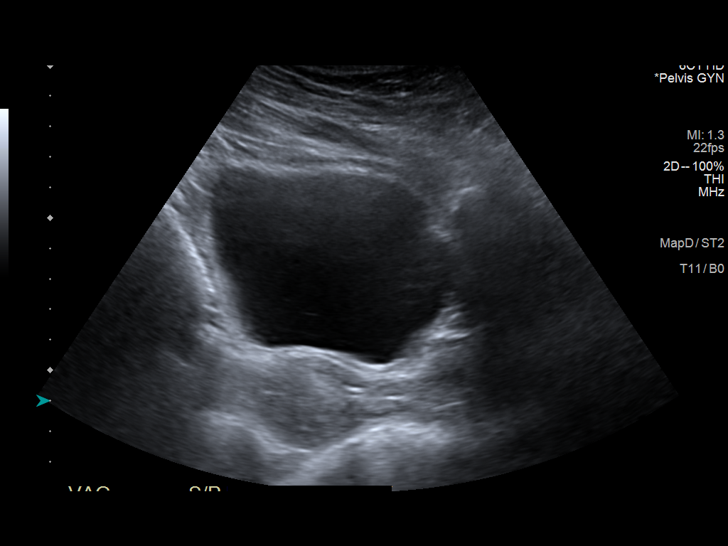
[im 4/38]
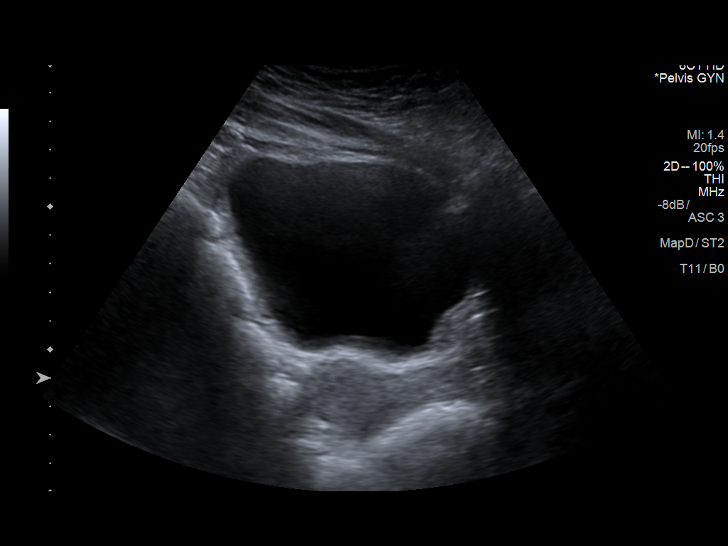
[im 7/38]
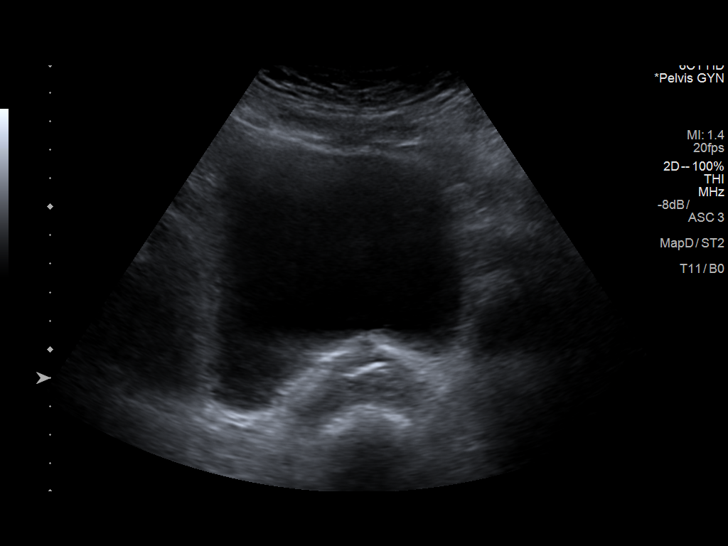
[im 10/38]
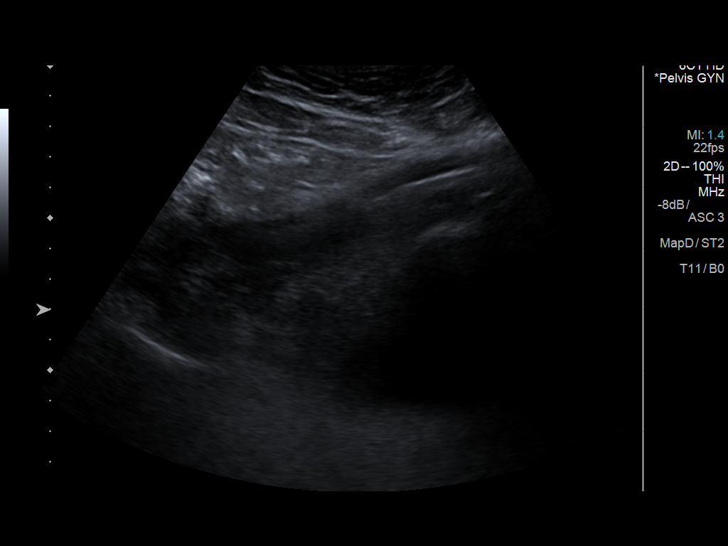
[im 13/38]
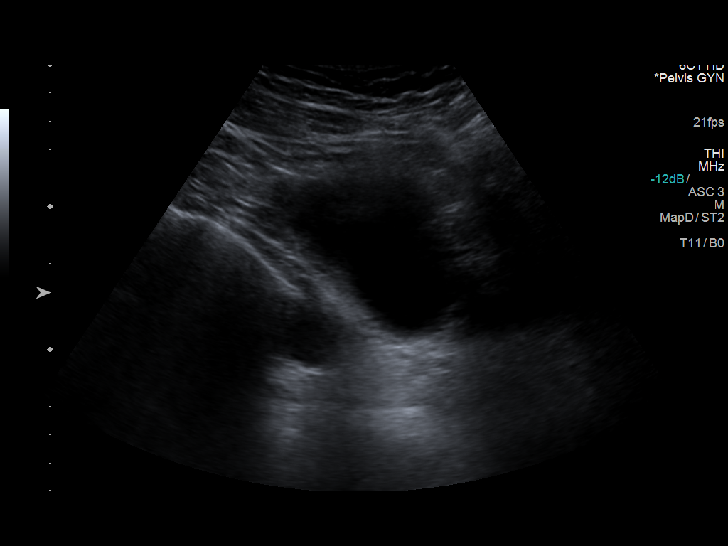
[im 16/38]
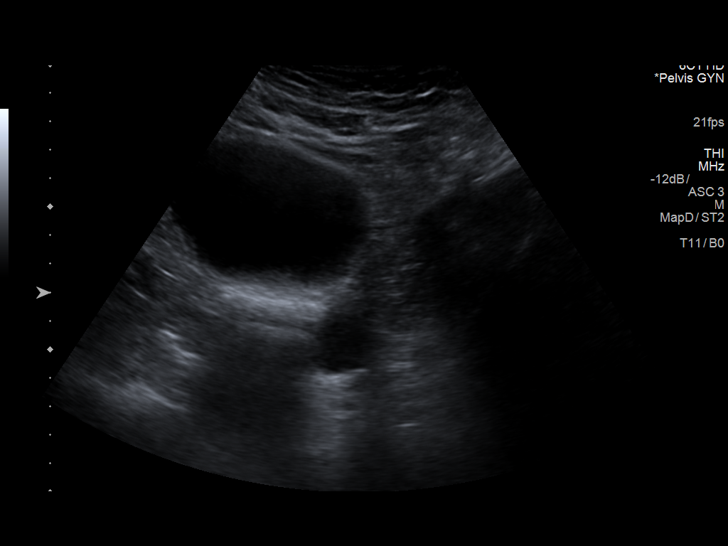
[im 19/38]
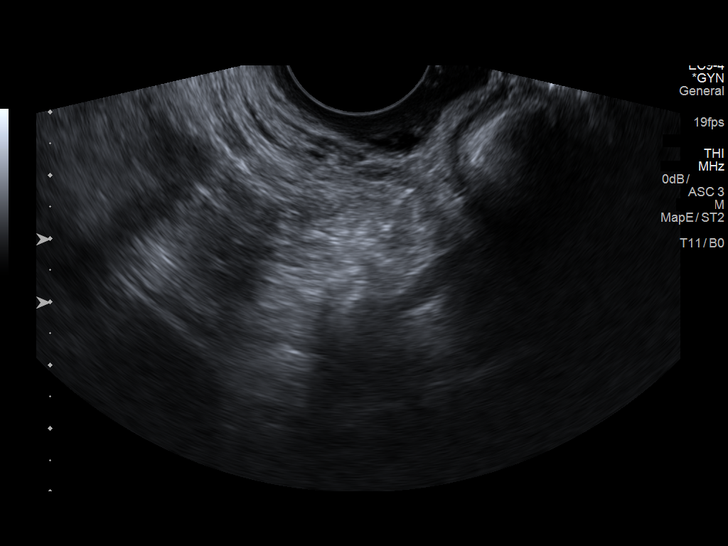
[im 22/38]
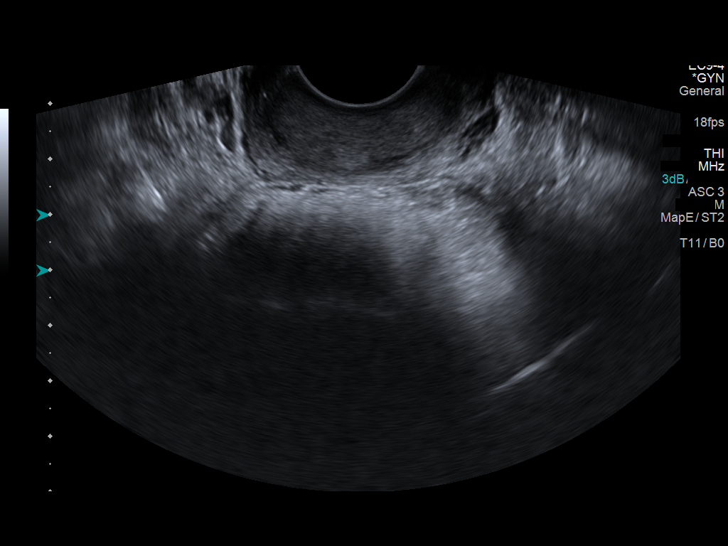
[im 25/38]
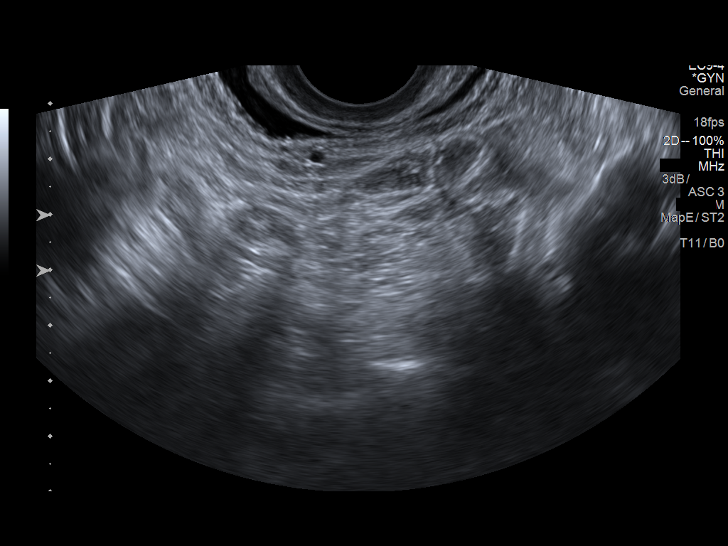
[im 28/38]
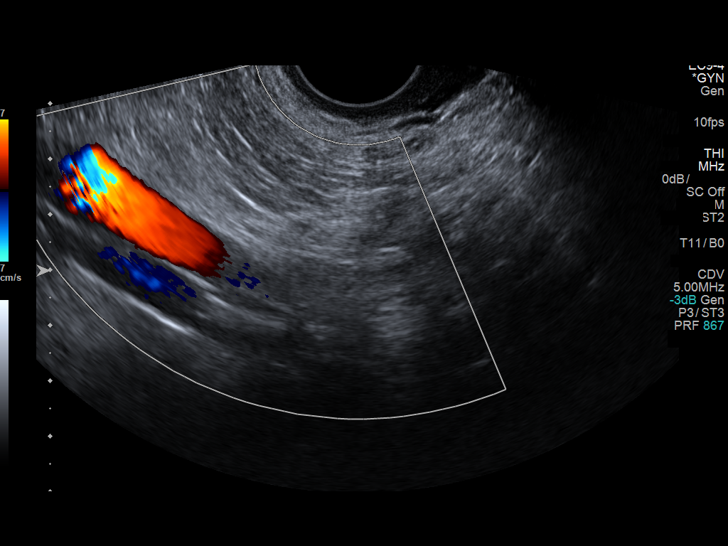
[im 31/38]
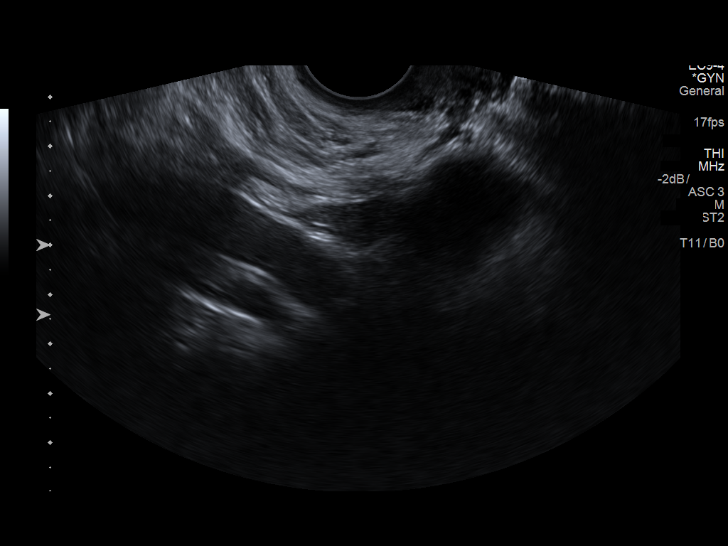
[im 34/38]
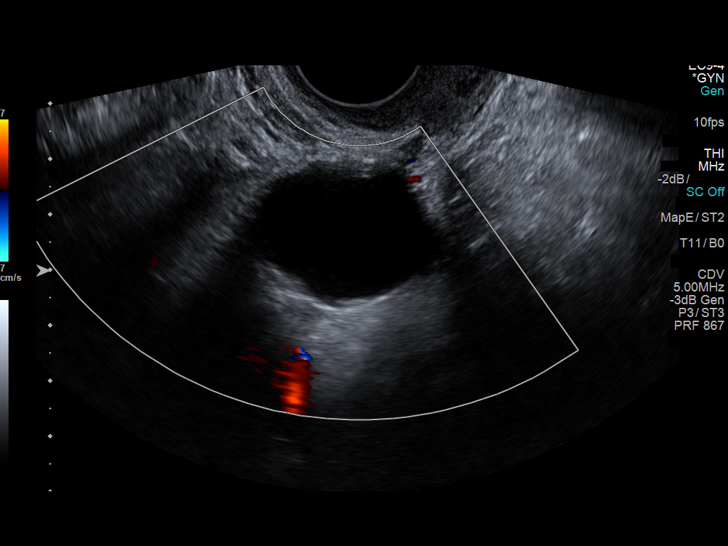
[im 38/38]
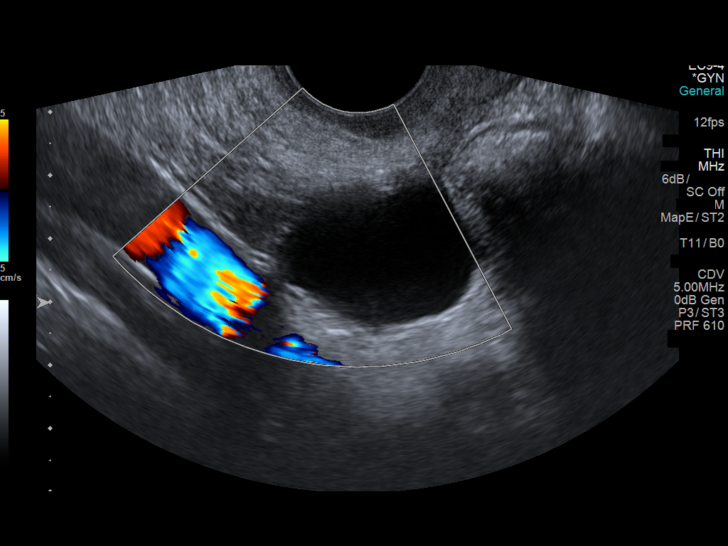

[13 of 25 positions shown; findings below may reference images not displayed]

FINDINGS: Uterus

Surgically absent.  No abnormality at the vaginal cuff.

Endometrium

Surgically absent.

Right ovary

Not visualized.  No adnexal mass.

Left ovary

3.4 x 2.4 x 2.7 cm cyst present within the left adnexa. Lesion
demonstrates a simple anechoic appearance without internal
septation, nodularity, or vascularity. Normal left ovarian tissue is
not visualized within the adjacent left adnexa. Previously seen
complex cystic lesion seen within the left ovary although ultrasound
from 09/16/2013 is no longer visualized.

Other findings

No abnormal free fluid.
IMPRESSION: 1. 3.4 x 2.4 x 2.7 cm simple left adnexal cyst, most compatible with
a physiologic ovarian cyst, although the underlying native left
ovary is not well visualized on this exam. This is almost certainly
benign, and no specific imaging follow up is recommended according
to the Society of Radiologists in BltrasoundJA3A Consensus
Conference Statement (Isamotu Wasagu et al. Management of Asymptomatic
Ovarian and Other Adnexal Cysts Imaged at US: Society of
Radiologists in Ultrasound Consensus Conference Statement 8919.
Radiology [DATE]): [phone].).
2. Status post hysterectomy with nonvisualization of the uterus and
right ovary.

## 2019-07-09 ENCOUNTER — Ambulatory Visit (INDEPENDENT_AMBULATORY_CARE_PROVIDER_SITE_OTHER): Payer: No Typology Code available for payment source | Admitting: Internal Medicine

## 2019-07-09 ENCOUNTER — Other Ambulatory Visit: Payer: Self-pay

## 2019-07-09 ENCOUNTER — Encounter: Payer: Self-pay | Admitting: Internal Medicine

## 2019-07-09 VITALS — BP 124/78 | HR 76 | Temp 97.7°F | Ht 64.0 in | Wt 216.0 lb

## 2019-07-09 DIAGNOSIS — Z1321 Encounter for screening for nutritional disorder: Secondary | ICD-10-CM

## 2019-07-09 DIAGNOSIS — Z0000 Encounter for general adult medical examination without abnormal findings: Secondary | ICD-10-CM | POA: Diagnosis not present

## 2019-07-09 DIAGNOSIS — Z9109 Other allergy status, other than to drugs and biological substances: Secondary | ICD-10-CM | POA: Diagnosis not present

## 2019-07-09 DIAGNOSIS — K219 Gastro-esophageal reflux disease without esophagitis: Secondary | ICD-10-CM | POA: Diagnosis not present

## 2019-07-09 DIAGNOSIS — E119 Type 2 diabetes mellitus without complications: Secondary | ICD-10-CM | POA: Diagnosis not present

## 2019-07-09 LAB — CBC
HCT: 38 % (ref 36.0–46.0)
Hemoglobin: 12 g/dL (ref 12.0–15.0)
MCHC: 31.5 g/dL (ref 30.0–36.0)
MCV: 70.5 fl — ABNORMAL LOW (ref 78.0–100.0)
Platelets: 415 10*3/uL — ABNORMAL HIGH (ref 150.0–400.0)
RBC: 5.4 Mil/uL — ABNORMAL HIGH (ref 3.87–5.11)
RDW: 15.8 % — ABNORMAL HIGH (ref 11.5–15.5)
WBC: 6.4 10*3/uL (ref 4.0–10.5)

## 2019-07-09 LAB — COMPREHENSIVE METABOLIC PANEL
ALT: 23 U/L (ref 0–35)
AST: 16 U/L (ref 0–37)
Albumin: 4.3 g/dL (ref 3.5–5.2)
Alkaline Phosphatase: 70 U/L (ref 39–117)
BUN: 11 mg/dL (ref 6–23)
CO2: 28 mEq/L (ref 19–32)
Calcium: 9.8 mg/dL (ref 8.4–10.5)
Chloride: 103 mEq/L (ref 96–112)
Creatinine, Ser: 0.79 mg/dL (ref 0.40–1.20)
GFR: 95.36 mL/min (ref >60.00)
Glucose, Bld: 93 mg/dL (ref 70–99)
Potassium: 4.3 mEq/L (ref 3.5–5.1)
Sodium: 138 mEq/L (ref 135–145)
Total Bilirubin: 0.3 mg/dL (ref 0.2–1.2)
Total Protein: 6.5 g/dL (ref 6.0–8.3)

## 2019-07-09 LAB — MICROALBUMIN / CREATININE URINE RATIO
Creatinine,U: 175.6 mg/dL
Microalb Creat Ratio: 0.7 mg/g (ref 0.0–30.0)
Microalb, Ur: 1.2 mg/dL (ref 0.0–1.9)

## 2019-07-09 LAB — HEMOGLOBIN A1C: Hgb A1c MFr Bld: 6 % (ref 4.6–6.5)

## 2019-07-09 LAB — LIPID PANEL
Cholesterol: 186 mg/dL (ref 0–200)
HDL: 41.4 mg/dL (ref >39.00)
LDL Cholesterol: 129 mg/dL — ABNORMAL HIGH (ref 0–99)
NonHDL: 144.2
Total CHOL/HDL Ratio: 4
Triglycerides: 76 mg/dL (ref 0.0–149.0)
VLDL: 15.2 mg/dL (ref 0.0–40.0)

## 2019-07-09 LAB — VITAMIN D 25 HYDROXY (VIT D DEFICIENCY, FRACTURES): VITD: 65.14 ng/mL (ref 30.00–100.00)

## 2019-07-09 NOTE — Patient Instructions (Signed)
Health Maintenance, Female Adopting a healthy lifestyle and getting preventive care are important in promoting health and wellness. Ask your health care provider about:  The right schedule for you to have regular tests and exams.  Things you can do on your own to prevent diseases and keep yourself healthy. What should I know about diet, weight, and exercise? Eat a healthy diet   Eat a diet that includes plenty of vegetables, fruits, low-fat dairy products, and lean protein.  Do not eat a lot of foods that are high in solid fats, added sugars, or sodium. Maintain a healthy weight Body mass index (BMI) is used to identify weight problems. It estimates body fat based on height and weight. Your health care provider can help determine your BMI and help you achieve or maintain a healthy weight. Get regular exercise Get regular exercise. This is one of the most important things you can do for your health. Most adults should:  Exercise for at least 150 minutes each week. The exercise should increase your heart rate and make you sweat (moderate-intensity exercise).  Do strengthening exercises at least twice a week. This is in addition to the moderate-intensity exercise.  Spend less time sitting. Even light physical activity can be beneficial. Watch cholesterol and blood lipids Have your blood tested for lipids and cholesterol at 45 years of age, then have this test every 5 years. Have your cholesterol levels checked more often if:  Your lipid or cholesterol levels are high.  You are older than 45 years of age.  You are at high risk for heart disease. What should I know about cancer screening? Depending on your health history and family history, you may need to have cancer screening at various ages. This may include screening for:  Breast cancer.  Cervical cancer.  Colorectal cancer.  Skin cancer.  Lung cancer. What should I know about heart disease, diabetes, and high blood  pressure? Blood pressure and heart disease  High blood pressure causes heart disease and increases the risk of stroke. This is more likely to develop in people who have high blood pressure readings, are of African descent, or are overweight.  Have your blood pressure checked: ? Every 3-5 years if you are 18-39 years of age. ? Every year if you are 40 years old or older. Diabetes Have regular diabetes screenings. This checks your fasting blood sugar level. Have the screening done:  Once every three years after age 40 if you are at a normal weight and have a low risk for diabetes.  More often and at a younger age if you are overweight or have a high risk for diabetes. What should I know about preventing infection? Hepatitis B If you have a higher risk for hepatitis B, you should be screened for this virus. Talk with your health care provider to find out if you are at risk for hepatitis B infection. Hepatitis C Testing is recommended for:  Everyone born from 1945 through 1965.  Anyone with known risk factors for hepatitis C. Sexually transmitted infections (STIs)  Get screened for STIs, including gonorrhea and chlamydia, if: ? You are sexually active and are younger than 45 years of age. ? You are older than 45 years of age and your health care provider tells you that you are at risk for this type of infection. ? Your sexual activity has changed since you were last screened, and you are at increased risk for chlamydia or gonorrhea. Ask your health care provider if   you are at risk.  Ask your health care provider about whether you are at high risk for HIV. Your health care provider may recommend a prescription medicine to help prevent HIV infection. If you choose to take medicine to prevent HIV, you should first get tested for HIV. You should then be tested every 3 months for as long as you are taking the medicine. Pregnancy  If you are about to stop having your period (premenopausal) and  you may become pregnant, seek counseling before you get pregnant.  Take 400 to 800 micrograms (mcg) of folic acid every day if you become pregnant.  Ask for birth control (contraception) if you want to prevent pregnancy. Osteoporosis and menopause Osteoporosis is a disease in which the bones lose minerals and strength with aging. This can result in bone fractures. If you are 65 years old or older, or if you are at risk for osteoporosis and fractures, ask your health care provider if you should:  Be screened for bone loss.  Take a calcium or vitamin D supplement to lower your risk of fractures.  Be given hormone replacement therapy (HRT) to treat symptoms of menopause. Follow these instructions at home: Lifestyle  Do not use any products that contain nicotine or tobacco, such as cigarettes, e-cigarettes, and chewing tobacco. If you need help quitting, ask your health care provider.  Do not use street drugs.  Do not share needles.  Ask your health care provider for help if you need support or information about quitting drugs. Alcohol use  Do not drink alcohol if: ? Your health care provider tells you not to drink. ? You are pregnant, may be pregnant, or are planning to become pregnant.  If you drink alcohol: ? Limit how much you use to 0-1 drink a day. ? Limit intake if you are breastfeeding.  Be aware of how much alcohol is in your drink. In the [place] one drink equals one 12 oz bottle of beer (355 mL), one 5 oz glass of wine (148 mL), or one 1 oz glass of hard liquor (44 mL). General instructions  Schedule regular health, dental, and eye exams.  Stay current with your vaccines.  Tell your health care provider if: ? You often feel depressed. ? You have ever been abused or do not feel safe at home. Summary  Adopting a healthy lifestyle and getting preventive care are important in promoting health and wellness.  Follow your health care provider's instructions about healthy  diet, exercising, and getting tested or screened for diseases.  Follow your health care provider's instructions on monitoring your cholesterol and blood pressure. This information is not intended to replace advice given to you by your health care provider. Make sure you discuss any questions you have with your health care provider. Document Released: 06/18/2011 Document Revised: 11/26/2018 Document Reviewed: 11/26/2018 Elsevier Patient Education  2020 Elsevier Inc.  

## 2019-07-09 NOTE — Assessment & Plan Note (Signed)
Recently failed a wean attempt. Increase Pantoprazole from 1/2 tablet daily to 1 whole tablet daily. CBC, CMP today.

## 2019-07-09 NOTE — Assessment & Plan Note (Addendum)
Controlled with diet and exercise at this time. A1C, CMP, urine microalbumin today. Foot exam today Encouraged yearly eye exam Encouraged her to get a flu shot in the fall She declines pneumovax

## 2019-07-09 NOTE — Assessment & Plan Note (Signed)
Controlled on Singulair and Xyzal.  Takes an occasional Claritin if needed. Continue. Will monitor.

## 2019-07-09 NOTE — Progress Notes (Signed)
Subjective:    Patient ID: Alejandra Watkins, female    DOB: 1976/08/11, 43 y.o.   MRN: [id number]  HPI  Patient presents to the clinic today for her annual physical examination.  She is due for a follow up of chronic conditions.  GERD:  She denies breakthrough on Pantoprazole.  There is no upper GI on file. Failed a previous wean attempt.  DM 2:  Her last A1C was 6.3 on 06/2018.  Her sugars range < 100. She is not taking any diabetic medications as this time.  She checks her feet daily.  She gets eye exams annually.  Allergies:  Occur all year long.  Unsure of triggers.  Takes Singulair and Xyzal daily.  Flu: 08/2018 Tetanus: unsure Pneumovax: never Pap Smear: 08/2017 Mammogram:10/2018 Vision screening: annually Dentist: biannually  Diet: She does eat meat. She consumes more veggies than fruits. She does not eat fried food but does consumes a high fat diet. She drinks mostly water, coffee and tea. Exercise: None    Review of Systems  Past Medical History:  Diagnosis Date  . Acid reflux   . Anemia   . Diabetes mellitus without complication ([place])   . Family history of colon cancer   . History of chicken pox   . Leiomyoma   . Ovarian cyst    seen at [facility]    Current Outpatient Medications  Medication Sig Dispense Refill  . benzonatate (TESSALON) 100 MG capsule Take 1 capsule (100 mg total) by mouth 3 (three) times daily as needed for cough. 20 capsule 0  . Cholecalciferol (VITAMIN D3) 5000 units CAPS Take 1 capsule by mouth daily.    Marland Kitchen glucose blood (BAYER CONTOUR NEXT TEST) test strip 1 each by Other route 2 (two) times daily as needed for other. Use as instructed 200 each 2  . levocetirizine (XYZAL) 5 MG tablet     . levonorgestrel-ethinyl estradiol (AVIANE) 0.1-20 MG-MCG tablet Take 1 tablet by mouth daily. 84 tablet 3  . montelukast (SINGULAIR) 10 MG tablet     . Multiple Vitamins-Minerals (MULTIVITAMIN WOMEN) TABS Take 1 tablet by mouth daily.    . orphenadrine  (NORFLEX) 100 MG tablet Take 1 tablet (100 mg total) by mouth 2 (two) times daily. 20 tablet 0  . pantoprazole (PROTONIX) 40 MG tablet Take by mouth daily. Half a tablet  3  . predniSONE (DELTASONE) 10 MG tablet Take 6 tabs day 1, 5 tabs day 2, 4 tabs day 3, 3 tabs day 4, 2 tabs day 5, 1 tab day 6 21 tablet 0   No current facility-administered medications for this visit.     No Known Allergies  Family History  Problem Relation Age of Onset  . Hypertension Mother   . Alcoholism Father   . Hypertension Paternal Grandmother   . Diabetes Paternal Grandmother        type 2  . Colon cancer Other   . Diabetes Other        type 2  . Hypertension Other   . Diabetes Other        type 2  . Stroke Other   . Lung cancer Paternal Uncle   . Heart disease Neg Hx   . Breast cancer Neg Hx     Social History   Socioeconomic History  . Marital status: Married    Spouse name: Jori Moll  . Number of children: 3  . Years of education: Not on file  . Highest education level: Not on  file  Occupational History  . 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[employer]  . Financial resource strain: Not on file  . Food insecurity    Worry: Not on file    Inability: Not on file  . Transportation needs    Medical: Not on file    Non-medical: Not on file  Tobacco Use  . Smoking status: Never Smoker  . Smokeless tobacco: Never Used  Substance and Sexual Activity  . Alcohol use: Yes    Alcohol/week: 1.0 standard drinks    Types: 1 Glasses of wine per week    Comment: occasional  . Drug use: No  . Sexual activity: Yes    Birth control/protection: Surgical    Comment: Hysterectomy  Lifestyle  . Physical activity    Days per week: Not on file    Minutes per session: Not on file  . Stress: Not on file  Relationships  . Social Herbalist on phone: Not on file    Gets together: Not on file    Attends religious service: Not on file    Active member of club or  organization: Not on file    Attends meetings of clubs or organizations: Not on file    Relationship status: Not on file  . Intimate partner violence    Fear of current or ex partner: Not on file    Emotionally abused: Not on file    Physically abused: Not on file    Forced sexual activity: Not on file  Other Topics Concern  . Not on file  Social History Narrative  . Not on file     Constitutional: Denies fever, malaise, fatigue, headache or abrupt weight changes.  HEENT: Denies eye pain, eye redness, ear pain, ringing in the ears, wax buildup, runny nose, nasal congestion, bloody nose, or sore throat. Respiratory: Denies difficulty breathing, shortness of breath, cough or sputum production.   Cardiovascular: Denies chest pain, chest tightness, palpitations or swelling in the hands or feet.  Gastrointestinal: Denies abdominal pain, bloating, constipation, diarrhea or blood in the stool.  GU: Denies urgency, frequency, pain with urination, burning sensation, blood in urine, odor or discharge. Musculoskeletal: Denies decrease in range of motion, difficulty with gait, muscle pain or joint pain and swelling.  Skin: Denies redness, rashes, lesions or ulcercations.  Neurological: Denies dizziness, difficulty with memory, difficulty with speech or problems with balance and coordination.  Psych: Denies anxiety, depression, SI/HI.  No other specific complaints in a complete review of systems (except as listed in HPI above).     Objective:   Physical Exam   BP 124/78   Pulse 76   Temp 97.7 F (36.5 C) (Temporal)   Ht 5\' 4"  (1.626 m)   Wt 216 lb (98 kg)   LMP  (LMP Unknown)   SpO2 99%   BMI 37.08 kg/m   Wt Readings from Last 3 Encounters:  01/29/19 248 lb (112.5 kg)  01/14/19 246 lb (111.6 kg)  07/03/18 237 lb (107.5 kg)    General: Appears her stated age, well developed, well nourished in NAD. Skin: Warm, dry and intact. No ulcerations noted. HEENT: Head: normal shape and  size; Eyes: sclera white, no icterus, conjunctiva pink, PERRLA and EOMs intact; Ears: Tm's gray and intact, normal light reflex;  Neck:  Neck supple, trachea midline. No masses, lumps or thyromegaly present.  Cardiovascular: Normal rate and rhythm. S1,S2 noted.  No murmur, rubs or gallops noted. No  JVD or BLE edema.  Pulmonary/Chest: Normal effort and positive vesicular breath sounds. No respiratory distress. No wheezes, rales or ronchi noted.  Abdomen: Soft and nontender. Normal bowel sounds. No distention or masses noted. Liver, spleen and kidneys non palpable. Musculoskeletal: Strength 5/5 BUE/BLE. No difficulty with gait.  Neurological: Alert and oriented. Cranial nerves II-XII grossly intact. Coordination normal.  Psychiatric: Mood and affect normal. Behavior is normal. Judgment and thought content normal.     BMET    Component Value Date/Time   NA 138 07/03/2018 0837   NA 141 01/07/2017 0926   NA 141 10/30/2014 1545   K 4.0 07/03/2018 0837   K 4.5 10/30/2014 1545   CL 102 07/03/2018 0837   CL 107 10/30/2014 1545   CO2 30 07/03/2018 0837   CO2 28 10/30/2014 1545   GLUCOSE 104 (H) 07/03/2018 0837   GLUCOSE 83 10/30/2014 1545   BUN 15 07/03/2018 0837   BUN 12 01/07/2017 0926   BUN 16 10/30/2014 1545   CREATININE 0.81 07/03/2018 0837   CREATININE 1.17 10/30/2014 1545   CALCIUM 9.5 07/03/2018 0837   CALCIUM 8.0 (L) 10/30/2014 1545   GFRNONAA 93 01/07/2017 0926   GFRNONAA 54 (L) 10/30/2014 1545   GFRNONAA >60 05/08/2012 1158   GFRAA 107 01/07/2017 0926   GFRAA >60 10/30/2014 1545   GFRAA >60 05/08/2012 1158    Lipid Panel     Component Value Date/Time   CHOL 180 07/03/2018 0837   CHOL 219 (H) 01/07/2017 0926   TRIG 64.0 07/03/2018 0837   HDL 47.70 07/03/2018 0837   HDL 53 01/07/2017 0926   CHOLHDL 4 07/03/2018 0837   VLDL 12.8 07/03/2018 0837   LDLCALC 120 (H) 07/03/2018 0837   LDLCALC 141 (H) 01/07/2017 0926    CBC    Component Value Date/Time   WBC 5.7  07/03/2018 0837   RBC 5.24 (H) 07/03/2018 0837   HGB 11.8 (L) 07/03/2018 0837   HGB 10.9 (L) 01/07/2017 1518   HCT 36.8 07/03/2018 0837   HCT 37.2 01/07/2017 1518   PLT 412.0 (H) 07/03/2018 0837   PLT 409 (H) 01/07/2017 1518   MCV 70.1 (L) 07/03/2018 0837   MCV 70 (L) 01/07/2017 1518   MCV 72 (L) 10/30/2014 1545   MCH 20.6 (L) 01/07/2017 1518   MCH 23.3 (L) 07/23/2016 2117   MCHC 32.0 07/03/2018 0837   RDW 16.5 (H) 07/03/2018 0837   RDW 17.5 (H) 01/07/2017 1518   RDW 15.5 (H) 10/30/2014 1545   LYMPHSABS 2.9 01/07/2017 1518   LYMPHSABS 2.6 10/30/2014 1545   MONOABS 1.1 (H) 07/23/2016 2117   MONOABS 0.8 10/30/2014 1545   EOSABS 0.2 01/07/2017 1518   EOSABS 0.1 10/30/2014 1545   BASOSABS 0.0 01/07/2017 1518   BASOSABS 0.1 10/30/2014 1545    Hgb A1C Lab Results  Component Value Date   HGBA1C 6.3 07/03/2018           Assessment & Plan:    Preventative Health Maintenance:  Encouraged her to get a flu shot in the fall She declines tetanus and pneumovax Pap smear UTD Mammogram due 10/2019, she will schedule Encouraged her to consume a balanced diet and exercise regimen Advised her to see an eye doctor and dentist annually Will check CBC, CMET, Lipid, A1C, urine microalbumin and Vit D today  Return in 1 year for annual wellness exam, or sooner if needed  Webb Silversmith, NP

## 2019-07-10 ENCOUNTER — Encounter: Payer: Self-pay | Admitting: Internal Medicine

## 2019-07-10 NOTE — Telephone Encounter (Signed)
Please see message and advise.  Thank you. ° °

## 2019-08-31 ENCOUNTER — Encounter: Payer: Self-pay | Admitting: Internal Medicine

## 2019-09-03 ENCOUNTER — Encounter: Payer: Self-pay | Admitting: Internal Medicine

## 2019-09-03 ENCOUNTER — Ambulatory Visit (INDEPENDENT_AMBULATORY_CARE_PROVIDER_SITE_OTHER): Payer: No Typology Code available for payment source | Admitting: Internal Medicine

## 2019-09-03 DIAGNOSIS — L659 Nonscarring hair loss, unspecified: Secondary | ICD-10-CM | POA: Diagnosis not present

## 2019-09-03 DIAGNOSIS — L639 Alopecia areata, unspecified: Secondary | ICD-10-CM

## 2019-09-03 NOTE — Patient Instructions (Signed)
Alopecia Areata, Adult  Alopecia areata is a condition that causes you to lose hair. You may lose hair on your scalp in patches. In some cases, you may lose all the hair on your scalp (alopecia totalis) or all the hair from your face and body (alopecia universalis). Alopecia areata is an autoimmune disease. This means that your body's defense system (immune system) mistakes normal parts of the body for germs or other things that can make you sick. When you have alopecia areata, the immune system attacks the hair follicles. Alopecia areata usually develops in childhood, but it can develop at any age. For some people, their hair grows back on its own and hair loss does not happen again. For others, their hair may fall out and grow back in cycles. The hair loss may last many years. Having this condition can be emotionally difficult, but it is not dangerous. What are the causes? The cause of this condition is not known. What increases the risk? This condition is more likely to develop in people who have:  A family history of alopecia.  A family history of another autoimmune disease, including type 1 diabetes and rheumatoid arthritis.  Asthma and allergies.  Down syndrome. What are the signs or symptoms? Round spots of patchy hair loss on the scalp is the main symptom of this condition. The spots may be mildly itchy. Other symptoms include:  Short dark hairs in the bald patches that are wider at the top (exclamation point hairs).  Dents, white spots, or lines in the fingernails or toenails.  Balding and body hair loss. This is rare. How is this diagnosed? This condition is diagnosed based on your symptoms and family history. Your health care provider will also check your scalp skin, teeth, and nails. Your health care provider may refer you to a specialist in hair and skin disorders (dermatologist). You may also have tests, including:  A hair pull test.  Blood tests or other screening tests  to check for autoimmune diseases, such as thyroid disease or diabetes.  Skin biopsy to confirm the diagnosis.  A procedure to examine the skin with a lighted magnifying instrument (dermoscopy). How is this treated? There is no cure for alopecia areata. Treatment is aimed at promoting the regrowth of hair and preventing the immune system from overreacting. No single treatment is right for all people with alopecia areata. It depends on the type of hair loss you have and how severe it is. Work with your health care provider to find the best treatment for you. Treatment may include:  Having regular checkups to make sure the condition is not getting worse (watchful waiting).  Steroid creams or pills for 6-8 weeks to stop the immune reaction and help hair to regrow more quickly.  Other topical medicines to alter the immune system response and support the hair growth cycle.  Steroid injections.  Therapy and counseling with a support group or therapist if you are having trouble coping with hair loss. Follow these instructions at home:  Learn as much as you can about your condition.  Apply topical creams only as told by your health care provider.  Take over-the-counter and prescription medicines only as told by your health care provider.  Consider getting a wig or products to make hair look fuller or to cover bald spots, if you feel uncomfortable with your appearance.  Get therapy or counseling if you are having a hard time coping with hair loss. Ask your health care provider to recommend  a counselor or support group.  Keep all follow-up visits as told by your health care provider. This is important. Contact a health care provider if:  Your hair loss gets worse, even with treatment.  You have new symptoms.  You are struggling emotionally. Summary  Alopecia areata is an autoimmune condition that makes your body's defense system (immune system) attack the hair follicles. This causes you  to lose hair.  Treatments may include regular checkups to make sure that the condition is not getting worse (watchful waiting), medicines, and steroid injections. This information is not intended to replace advice given to you by your health care provider. Make sure you discuss any questions you have with your health care provider. Document Released: 07/07/2004 Document Revised: 11/15/2017 Document Reviewed: 12/21/2016 Elsevier Patient Education  2020 Elsevier Inc.  

## 2019-09-03 NOTE — Progress Notes (Signed)
Virtual Visit via Video Note  I connected with Alejandra Watkins on 09/03/19 at  4:15 PM EDT by a video enabled telemedicine application and verified that I am speaking with the correct person using two identifiers.  Location: Patient: Home Provider: Office   I discussed the limitations of evaluation and management by telemedicine and the availability of in person appointments. The patient expressed understanding and agreed to proceed.  History of Present Illness:  Pt reports hair loss. She noticed this years ago. She reports the hair is coming out in clumps and she actually has a bald spot on the top of her hair. She denies changes in hair products. She is not pulling her hair but she wears hair extensions. She is not perming her hair. She reports other family members have issues with hair loss. She wants referral to dermatology.   Past Medical History:  Diagnosis Date  . Acid reflux   . Anemia   . Diabetes mellitus without complication ([place])   . Family history of colon cancer   . History of chicken pox   . Leiomyoma   . Ovarian cyst    seen at [facility]    Current Outpatient Medications  Medication Sig Dispense Refill  . Biotin 10 MG CAPS Take by mouth.    . Cholecalciferol (VITAMIN D3) 5000 units CAPS Take 1 capsule by mouth daily.    Marland Kitchen glucose blood (BAYER CONTOUR NEXT TEST) test strip 1 each by Other route 2 (two) times daily as needed for other. Use as instructed 200 each 2  . levocetirizine (XYZAL) 5 MG tablet     . levonorgestrel-ethinyl estradiol (AVIANE) 0.1-20 MG-MCG tablet Take 1 tablet by mouth daily. 84 tablet 3  . montelukast (SINGULAIR) 10 MG tablet     . Multiple Vitamins-Minerals (MULTIVITAMIN WOMEN) TABS Take 1 tablet by mouth daily.    . pantoprazole (PROTONIX) 40 MG tablet Take 1 tablet by mouth daily.  3  . vitamin B-12 (CYANOCOBALAMIN) 1000 MCG tablet Take 1,000 mcg by mouth daily.     No current facility-administered medications for this visit.     No Known  Allergies  Family History  Problem Relation Age of Onset  . Hypertension Mother   . Alcoholism Father   . Hypertension Paternal Grandmother   . Diabetes Paternal Grandmother        type 2  . Colon cancer Other   . Diabetes Other        type 2  . Hypertension Other   . Diabetes Other        type 2  . Stroke Other   . Lung cancer Paternal Uncle   . Heart disease Neg Hx   . Breast cancer Neg Hx     Social History   Socioeconomic History  . Marital status: Married    Spouse name: Jori Moll  . Number of children: 3  . Years of education: Not on file  . Highest education level: Not on file  Occupational History  . 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 Financial resource strain: Not on file  . Food insecurity    Worry: Not on file    Inability: Not on file  . Transportation needs    Medical: Not on file    Non-medical: Not on file  Tobacco Use  . Smoking status: Never Smoker  . Smokeless tobacco: Never Used  Substance and Sexual Activity  . Alcohol use: Yes  Alcohol/week: 1.0 standard drinks    Types: 1 Glasses of wine per week    Comment: occasional  . Drug use: No  . Sexual activity: Yes    Birth control/protection: Surgical    Comment: Hysterectomy  Lifestyle  . Physical activity    Days per week: Not on file    Minutes per session: Not on file  . Stress: Not on file  Relationships  . Social Herbalist on phone: Not on file    Gets together: Not on file    Attends religious service: Not on file    Active member of club or organization: Not on file    Attends meetings of clubs or organizations: Not on file    Relationship status: Not on file  . Intimate partner violence    Fear of current or ex partner: Not on file    Emotionally abused: Not on file    Physically abused: Not on file    Forced sexual activity: Not on file  Other Topics Concern  . Not on file  Social History Narrative  . Not on file      Constitutional: Denies fever, malaise, fatigue, headache or abrupt weight changes.  Respiratory: Denies difficulty breathing, shortness of breath, cough or sputum production.   Cardiovascular: Denies chest pain, chest tightness, palpitations or swelling in the hands or feet.  Skin: Pt reports hair loss. Denies redness, rashes, lesions or ulcercations.    No other specific complaints in a complete review of systems (except as listed in HPI above).    Observations/Objective:  LMP  (LMP Unknown)  Wt Readings from Last 3 Encounters:  07/09/19 216 lb (98 kg)  01/29/19 248 lb (112.5 kg)  01/14/19 246 lb (111.6 kg)    General: Appears her stated age, obese, in NAD. Skin: Warm, dry and intact. No bald patches noted. Neurological: Alert and oriented.   BMET    Component Value Date/Time   NA 138 07/09/2019 0831   NA 141 01/07/2017 0926   NA 141 10/30/2014 1545   K 4.3 07/09/2019 0831   K 4.5 10/30/2014 1545   CL 103 07/09/2019 0831   CL 107 10/30/2014 1545   CO2 28 07/09/2019 0831   CO2 28 10/30/2014 1545   GLUCOSE 93 07/09/2019 0831   GLUCOSE 83 10/30/2014 1545   BUN 11 07/09/2019 0831   BUN 12 01/07/2017 0926   BUN 16 10/30/2014 1545   CREATININE 0.79 07/09/2019 0831   CREATININE 1.17 10/30/2014 1545   CALCIUM 9.8 07/09/2019 0831   CALCIUM 8.0 (L) 10/30/2014 1545   GFRNONAA 93 01/07/2017 0926   GFRNONAA 54 (L) 10/30/2014 1545   GFRNONAA >60 05/08/2012 1158   GFRAA 107 01/07/2017 0926   GFRAA >60 10/30/2014 1545   GFRAA >60 05/08/2012 1158    Lipid Panel     Component Value Date/Time   CHOL 186 07/09/2019 0831   CHOL 219 (H) 01/07/2017 0926   TRIG 76.0 07/09/2019 0831   HDL 41.40 07/09/2019 0831   HDL 53 01/07/2017 0926   CHOLHDL 4 07/09/2019 0831   VLDL 15.2 07/09/2019 0831   LDLCALC 129 (H) 07/09/2019 0831   LDLCALC 141 (H) 01/07/2017 0926    CBC    Component Value Date/Time   WBC 6.4 07/09/2019 0831   RBC 5.40 (H) 07/09/2019 0831   HGB 12.0  07/09/2019 0831   HGB 10.9 (L) 01/07/2017 1518   HCT 38.0 07/09/2019 0831   HCT 37.2 01/07/2017 1518  PLT 415.0 (H) 07/09/2019 0831   PLT 409 (H) 01/07/2017 1518   MCV 70.5 (L) 07/09/2019 0831   MCV 70 (L) 01/07/2017 1518   MCV 72 (L) 10/30/2014 1545   MCH 20.6 (L) 01/07/2017 1518   MCH 23.3 (L) 07/23/2016 2117   MCHC 31.5 07/09/2019 0831   RDW 15.8 (H) 07/09/2019 0831   RDW 17.5 (H) 01/07/2017 1518   RDW 15.5 (H) 10/30/2014 1545   LYMPHSABS 2.9 01/07/2017 1518   LYMPHSABS 2.6 10/30/2014 1545   MONOABS 1.1 (H) 07/23/2016 2117   MONOABS 0.8 10/30/2014 1545   EOSABS 0.2 01/07/2017 1518   EOSABS 0.1 10/30/2014 1545   BASOSABS 0.0 01/07/2017 1518   BASOSABS 0.1 10/30/2014 1545    Hgb A1C Lab Results  Component Value Date   HGBA1C 6.0 07/09/2019        Assessment and Plan:  Hair Loss, Alopecia Aretea:  She declines lab workup today Referral placed to derm per her request for further evaluation  Follow Up Instructions:    I discussed the assessment and treatment plan with the patient. The patient was provided an opportunity to ask questions and all were answered. The patient agreed with the plan and demonstrated an understanding of the instructions.   The patient was advised to call back or seek an in-person evaluation if the symptoms worsen or if the condition fails to improve as anticipated.   Webb Silversmith, NP

## 2019-10-28 LAB — HM DIABETES EYE EXAM

## 2019-11-22 NOTE — Progress Notes (Signed)
Alejandra Fenton, NP   Chief Complaint  Patient presents with  . Vaginitis    Vaginal itching x2 weeks, no discharge/odor    HPI:      [name] is a 43 y.o. [phone] who LMP was No LMP recorded (lmp unknown). Patient has had a hysterectomy., presents today for vaginal itching for the past 2 wks. Initially had increased d/c as well but treated with monistat-1 and diflucan with sx improvement. Itching then recurred, mostly in inguinal area and clitoral area. Pt now treating with OTC hydrocortisone crm and ketoconazole crm ext BID for the past wk. Itching started to improve a couple days ago. Pt using dial soap and dryer sheets.  She is sex active, occas vag dryness. No urin sx, LBP, pelvic pain, fevers.    Patient Active Problem List   Diagnosis Date Noted  . Environmental allergies 07/03/2018  . GERD (gastroesophageal reflux disease) 06/27/2017  . Diabetes ([place]) 03/30/2015    Past Surgical History:  Procedure Laterality Date  . ABDOMINAL HYSTERECTOMY     partial  . BREAST BIOPSY Left 15 yrs ago   needle biopsy. Benign  . OVARIAN CYST REMOVAL Left   . TUBAL LIGATION      Family History  Problem Relation Age of Onset  . Hypertension Mother   . Alcoholism Father   . Hypertension Paternal Grandmother   . Diabetes Paternal Grandmother        type 2  . Colon cancer Other   . Diabetes Other        type 2  . Hypertension Other   . Diabetes Other        type 2  . Stroke Other   . Lung cancer Paternal Uncle   . Heart disease Neg Hx   . Breast cancer Neg Hx     Social History   Socioeconomic History  . Marital status: Married    Spouse name: Jori Moll  . Number of children: 3  . Years of education: Not on file  . Highest education level: Not on file  Occupational History  . 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 Financial resource strain: Not on file  . Food insecurity    Worry: Not on file    Inability: Not on file   . Transportation needs    Medical: Not on file    Non-medical: Not on file  Tobacco Use  . Smoking status: Never Smoker  . Smokeless tobacco: Never Used  Substance and Sexual Activity  . Alcohol use: Yes    Alcohol/week: 1.0 standard drinks    Types: 1 Glasses of wine per week    Comment: occasional  . Drug use: No  . Sexual activity: Yes    Birth control/protection: Surgical    Comment: Hysterectomy  Lifestyle  . Physical activity    Days per week: Not on file    Minutes per session: Not on file  . Stress: Not on file  Relationships  . Social Herbalist on phone: Not on file    Gets together: Not on file    Attends religious service: Not on file    Active member of club or organization: Not on file    Attends meetings of clubs or organizations: Not on file    Relationship status: Not on file  . Intimate partner violence    Fear of current or ex partner: Not on file  Emotionally abused: Not on file    Physically abused: Not on file    Forced sexual activity: Not on file  Other Topics Concern  . Not on file  Social History Narrative  . Not on file    Outpatient Medications Prior to Visit  Medication Sig Dispense Refill  . Biotin 10 MG CAPS Take by mouth.    . Cholecalciferol (VITAMIN D3) 5000 units CAPS Take 1 capsule by mouth daily.    Marland Kitchen glucose blood (BAYER CONTOUR NEXT TEST) test strip 1 each by Other route 2 (two) times daily as needed for other. Use as instructed 200 each 2  . ketoconazole (NIZORAL) 2 % cream     . levocetirizine (XYZAL) 5 MG tablet     . levonorgestrel-ethinyl estradiol (AVIANE) 0.1-20 MG-MCG tablet Take 1 tablet by mouth daily. 84 tablet 3  . montelukast (SINGULAIR) 10 MG tablet     . Multiple Vitamins-Minerals (MULTIVITAMIN WOMEN) TABS Take 1 tablet by mouth daily.    . pantoprazole (PROTONIX) 40 MG tablet Take 1 tablet by mouth daily.  3  . vitamin B-12 (CYANOCOBALAMIN) 1000 MCG tablet Take 1,000 mcg by mouth daily.     No  facility-administered medications prior to visit.       ROS:  Review of Systems  Constitutional: Negative for fever.  Gastrointestinal: Negative for blood in stool, constipation, diarrhea, nausea and vomiting.  Genitourinary: Positive for vaginal discharge. Negative for dyspareunia, dysuria, flank pain, frequency, hematuria, urgency, vaginal bleeding and vaginal pain.  Musculoskeletal: Negative for back pain.  Skin: Negative for rash.   BREAST: No symptoms   OBJECTIVE:   Vitals:  BP 124/80   Ht 5\' 4"  (1.626 m)   Wt 216 lb (98 kg)   LMP  (LMP Unknown)   BMI 37.08 kg/m   Physical Exam Vitals signs reviewed.  Constitutional:      Appearance: She is well-developed.  Neck:     Musculoskeletal: Normal range of motion.  Pulmonary:     Effort: Pulmonary effort is normal.  Genitourinary:    General: Normal vulva.     Pubic Area: No rash.      Labia:        Right: No rash, tenderness or lesion.        Left: No rash, tenderness or lesion.      Vagina: Vaginal discharge present. No erythema or tenderness.     Cervix: Normal.     Uterus: Normal. Not enlarged and not tender.      Adnexa: Right adnexa normal and left adnexa normal.       Right: No mass or tenderness.         Left: No mass or tenderness.      Musculoskeletal: Normal range of motion.  Skin:    General: Skin is warm and dry.  Neurological:     General: No focal deficit present.     Mental Status: She is alert and oriented to person, place, and time.  Psychiatric:        Mood and Affect: Mood normal.        Behavior: Behavior normal.        Thought Content: Thought content normal.        Judgment: Judgment normal.     Results: Results for orders placed or performed in visit on 11/23/19 (from the past 24 hour(s))  POCT Wet Prep with KOH     Status: Normal   Collection Time: 11/23/19 11:46 AM  Result Value  Ref Range   Trichomonas, UA Negative    Clue Cells Wet Prep HPF POC neg    Epithelial Wet Prep  HPF POC     Yeast Wet Prep HPF POC neg    Bacteria Wet Prep HPF POC     RBC Wet Prep HPF POC     WBC Wet Prep HPF POC     KOH Prep POC Negative Negative     Assessment/Plan: Vagina itching - Plan: POCT Wet Prep with KOH; Neg wet prep/exam. Pt already treating with ketoconazole and hydrocortisone crm. Question chem etiology given area of sx. Line dry underwear, dove sens skin soap. Cont current regimen. If sx persist, will try lotrisone crm. F/u prn.     Return if symptoms worsen or fail to improve.  Brayson Livesey B. Peyson Delao, PA-C 11/23/2019 11:48 AM

## 2019-11-23 ENCOUNTER — Encounter: Payer: Self-pay | Admitting: Obstetrics and Gynecology

## 2019-11-23 ENCOUNTER — Ambulatory Visit (INDEPENDENT_AMBULATORY_CARE_PROVIDER_SITE_OTHER): Payer: No Typology Code available for payment source | Admitting: Obstetrics and Gynecology

## 2019-11-23 ENCOUNTER — Other Ambulatory Visit: Payer: Self-pay

## 2019-11-23 VITALS — BP 124/80 | Ht 64.0 in | Wt 216.0 lb

## 2019-11-23 DIAGNOSIS — N898 Other specified noninflammatory disorders of vagina: Secondary | ICD-10-CM

## 2019-11-23 LAB — POCT WET PREP WITH KOH
Clue Cells Wet Prep HPF POC: NEGATIVE
KOH Prep POC: NEGATIVE
Trichomonas, UA: NEGATIVE
Yeast Wet Prep HPF POC: NEGATIVE

## 2019-11-23 NOTE — Patient Instructions (Signed)
I value your feedback and entrusting us with your care. If you get a [facility] patient survey, I would appreciate you taking the time to let us know about your experience today. Thank you! 

## 2019-11-25 ENCOUNTER — Encounter: Payer: Self-pay | Admitting: Obstetrics and Gynecology

## 2019-11-26 ENCOUNTER — Other Ambulatory Visit: Payer: Self-pay | Admitting: Obstetrics and Gynecology

## 2019-11-26 MED ORDER — CLOTRIMAZOLE-BETAMETHASONE 1-0.05 % EX CREA: TOPICAL_CREAM | CUTANEOUS | 0 refills | Status: DC

## 2019-11-26 NOTE — Progress Notes (Signed)
Rx lotrisone crm for acute vaginitis.

## 2019-11-30 ENCOUNTER — Other Ambulatory Visit: Payer: Self-pay | Admitting: Obstetrics and Gynecology

## 2019-11-30 DIAGNOSIS — Z1231 Encounter for screening mammogram for malignant neoplasm of breast: Secondary | ICD-10-CM

## 2019-12-02 ENCOUNTER — Ambulatory Visit
Admission: RE | Admit: 2019-12-02 | Discharge: 2019-12-02 | Disposition: A | Discharge status: Home / Self Care | Payer: No Typology Code available for payment source | Source: Ambulatory Visit | Attending: Obstetrics and Gynecology | Admitting: Obstetrics and Gynecology

## 2019-12-02 DIAGNOSIS — Z1231 Encounter for screening mammogram for malignant neoplasm of breast: Secondary | ICD-10-CM | POA: Diagnosis not present

## 2019-12-03 ENCOUNTER — Encounter: Payer: Self-pay | Admitting: Obstetrics and Gynecology

## 2019-12-07 ENCOUNTER — Other Ambulatory Visit: Payer: Self-pay | Admitting: Obstetrics and Gynecology

## 2019-12-07 ENCOUNTER — Other Ambulatory Visit: Payer: Self-pay

## 2019-12-07 ENCOUNTER — Encounter: Payer: Self-pay | Admitting: Obstetrics and Gynecology

## 2019-12-07 ENCOUNTER — Other Ambulatory Visit: Payer: Self-pay | Admitting: Internal Medicine

## 2019-12-07 ENCOUNTER — Encounter: Payer: Self-pay | Admitting: Internal Medicine

## 2019-12-07 ENCOUNTER — Other Ambulatory Visit (INDEPENDENT_AMBULATORY_CARE_PROVIDER_SITE_OTHER): Payer: No Typology Code available for payment source

## 2019-12-07 DIAGNOSIS — Z3041 Encounter for surveillance of contraceptive pills: Secondary | ICD-10-CM

## 2019-12-07 DIAGNOSIS — Z20822 Contact with and (suspected) exposure to covid-19: Secondary | ICD-10-CM

## 2019-12-07 DIAGNOSIS — Z20828 Contact with and (suspected) exposure to other viral communicable diseases: Secondary | ICD-10-CM

## 2019-12-07 MED ORDER — LEVONORGESTREL-ETHINYL ESTRAD 0.1-20 MG-MCG PO TABS: 1.0000 | ORAL_TABLET | Freq: Every day | ORAL | 0 refills | Status: DC

## 2019-12-07 NOTE — Telephone Encounter (Signed)
This encounter was created in error - please disregard.

## 2019-12-08 LAB — SARS COV-2 SEROLOGY(COVID-19)AB(IGG,IGM),IMMUNOASSAY
SARS CoV-2 AB IgG: POSITIVE — AB
SARS CoV-2 IgM: NEGATIVE

## 2020-01-17 NOTE — Progress Notes (Signed)
PCP:  [name], NP   Chief Complaint  Patient presents with  . Gynecologic Exam     HPI:      [name] is a 44 y.o. G3P3000 who LMP was No LMP recorded (lmp unknown). Patient has had a hysterectomy., presents today for her annual examination.  Her menses are absent due to lap supracx hyst 2013 due to [place]. She does not have intermenstrual bleeding. No vasomotor sx. Pt on OCPs for hx of recurrent ovar cysts, started by [facility] a couple yrs ago. Pt is high risk for surgery. Breast tenderness resolved once changed to lower dose OCPs last yr.   Sex activity: single partner, contraception - status post hysterectomy.  Last Pap: 08/27/17  Results were: no abnormalities /neg HPV DNA. Still has cx Hx of STDs: none  Last mammogram: 12/02/19  Results: no abnormalities, repeat in a 1 yr There is no FH of breast cancer. There is no FH of ovarian cancer. The patient does do self-breast exams.  Tobacco use: The patient denies current or previous tobacco use. Alcohol use: social drinker No drug use.  Exercise: not active  She does get adequate calcium and Vitamin D in her diet.  Labs with PCP.   Past Medical History:  Diagnosis Date  . Acid reflux   . Anemia   . Diabetes mellitus without complication ([place])   . Family history of colon cancer   . History of chicken pox   . Leiomyoma   . Ovarian cyst    seen at [facility]    Past Surgical History:  Procedure Laterality Date  . ABDOMINAL HYSTERECTOMY     partial  . BREAST BIOPSY Left 15 yrs ago   needle biopsy. Benign  . OVARIAN CYST REMOVAL Left   . TUBAL LIGATION      Family History  Problem Relation Age of Onset  . Hypertension Mother   . Alcoholism Father   . Hypertension Paternal Grandmother   . Diabetes Paternal Grandmother        type 2  . Colon cancer Other   . Diabetes Other        type 2  . Hypertension Other   . Diabetes Other        type 2  . Stroke Other   . Lung cancer Paternal Uncle   . Heart  disease Neg Hx   . Breast cancer Neg Hx     Social History   Socioeconomic History  . Marital status: Married    Spouse name: Jori Moll  . Number of children: 3  . Years of education: Not on file  . Highest education level: Not on file  Occupational History  . 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 Smoking status: Never Smoker  . Smokeless tobacco: Never Used  Substance and Sexual Activity  . Alcohol use: Yes    Alcohol/week: 1.0 standard drinks    Types: 1 Glasses of wine per week    Comment: occasional  . Drug use: No  . Sexual activity: Yes    Birth control/protection: Surgical    Comment: Hysterectomy  Other Topics Concern  . Not on file  Social History Narrative  . Not on file   Social Determinants of Health   Financial Resource Strain:   . Difficulty of Paying Living Expenses: Not on file  Food Insecurity:   . Worried About Charity fundraiser in the Last Year: Not on  file  . [place] in the Last Year: Not on file  Transportation Needs:   . Lack of Transportation (Medical): Not on file  . Lack of Transportation (Non-Medical): Not on file  Physical Activity:   . Days of Exercise per Week: Not on file  . Minutes of Exercise per Session: Not on file  Stress:   . Feeling of Stress : Not on file  Social Connections:   . Frequency of Communication with Friends and Family: Not on file  . Frequency of Social Gatherings with Friends and Family: Not on file  . Attends Religious Services: Not on file  . Active Member of Clubs or Organizations: Not on file  . Attends Archivist Meetings: Not on file  . Marital Status: Not on file  Intimate Partner Violence:   . Fear of Current or Ex-Partner: Not on file  . Emotionally Abused: Not on file  . Physically Abused: Not on file  . Sexually Abused: Not on file    Current Meds  Medication Sig  . Biotin 10 MG CAPS Take by mouth.  . Cholecalciferol (VITAMIN D3) 5000  units CAPS Take 1 capsule by mouth daily.  . clotrimazole-betamethasone (LOTRISONE) cream Apply externally BID prn sx up to 2 wks  . glucose blood (BAYER CONTOUR NEXT TEST) test strip 1 each by Other route 2 (two) times daily as needed for other. Use as instructed  . ketoconazole (NIZORAL) 2 % cream   . levocetirizine (XYZAL) 5 MG tablet   . levonorgestrel-ethinyl estradiol (AVIANE) 0.1-20 MG-MCG tablet Take 1 tablet by mouth daily.  . montelukast (SINGULAIR) 10 MG tablet   . Multiple Vitamins-Minerals (MULTIVITAMIN WOMEN) TABS Take 1 tablet by mouth daily.  . pantoprazole (PROTONIX) 40 MG tablet Take 1 tablet by mouth daily.  . vitamin B-12 (CYANOCOBALAMIN) 1000 MCG tablet Take 1,000 mcg by mouth daily.  . [DISCONTINUED] levonorgestrel-ethinyl estradiol (AVIANE) 0.1-20 MG-MCG tablet Take 1 tablet by mouth daily.     ROS:  Review of Systems  Constitutional: Negative for fatigue, fever and unexpected weight change.  Respiratory: Negative for cough, shortness of breath and wheezing.   Cardiovascular: Negative for chest pain, palpitations and leg swelling.  Gastrointestinal: Negative for blood in stool, constipation, diarrhea, nausea and vomiting.  Endocrine: Negative for cold intolerance, heat intolerance and polyuria.  Genitourinary: Negative for dyspareunia, dysuria, flank pain, frequency, genital sores, hematuria, menstrual problem, pelvic pain, urgency, vaginal bleeding, vaginal discharge and vaginal pain.  Musculoskeletal: Negative for back pain, joint swelling and myalgias.  Skin: Negative for rash.  Neurological: Negative for dizziness, syncope, light-headedness, numbness and headaches.  Hematological: Negative for adenopathy.  Psychiatric/Behavioral: Negative for agitation, confusion, sleep disturbance and suicidal ideas. The patient is not nervous/anxious.      Objective: BP 120/80   Ht 5\' 4"  (1.626 m)   Wt 215 lb (97.5 kg)   LMP  (LMP Unknown)   BMI 36.90 kg/m     Physical Exam Constitutional:      Appearance: She is well-developed.  Genitourinary:     Vulva, vagina, right adnexa and left adnexa normal.     No vaginal discharge, erythema or tenderness.     Cervix is absent.     Uterus is absent.     No right or left adnexal mass present.     Right adnexa not tender.     Left adnexa not tender.     Genitourinary Comments: UTERUS/CX SURG REM  Neck:     Thyroid:  No thyromegaly.  Cardiovascular:     Rate and Rhythm: Normal rate and regular rhythm.     Heart sounds: Normal heart sounds. No murmur.  Pulmonary:     Effort: Pulmonary effort is normal.     Breath sounds: Normal breath sounds.  Chest:     Breasts:        Right: No mass, nipple discharge, skin change or tenderness.        Left: No mass, nipple discharge, skin change or tenderness.  Abdominal:     Palpations: Abdomen is soft.     Tenderness: There is no abdominal tenderness. There is no guarding.  Musculoskeletal:        General: Normal range of motion.     Cervical back: Normal range of motion.  Neurological:     General: No focal deficit present.     Mental Status: She is alert and oriented to person, place, and time.     Cranial Nerves: No cranial nerve deficit.  Skin:    General: Skin is warm and dry.  Psychiatric:        Mood and Affect: Mood normal.        Behavior: Behavior normal.        Thought Content: Thought content normal.        Judgment: Judgment normal.  Vitals reviewed.     Assessment/Plan: Encounter for annual routine gynecological examination  Encounter for screening mammogram for malignant neoplasm of breast - Plan: MM 3D SCREEN BREAST BILATERAL; pt current on mammo  Encounter for surveillance of contraceptive pills - Plan: levonorgestrel-ethinyl estradiol (AVIANE) 0.1-20 MG-MCG tablet; OCP RF  Meds ordered this encounter  Medications  . levonorgestrel-ethinyl estradiol (AVIANE) 0.1-20 MG-MCG tablet    Sig: Take 1 tablet by mouth daily.     Dispense:  84 tablet    Refill:  3    Order Specific Question:   Supervising Provider    Answer:   Gae Dry [id number]             GYN counsel breast self exam, mammography screening, adequate intake of calcium and vitamin D, diet and exercise     F/U  Return in about 1 year (around 01/17/2021).  Witten Certain B. Issaih Kaus, PA-C 01/18/2020 8:29 AM

## 2020-01-18 ENCOUNTER — Encounter: Payer: Self-pay | Admitting: Obstetrics and Gynecology

## 2020-01-18 ENCOUNTER — Other Ambulatory Visit: Payer: Self-pay

## 2020-01-18 ENCOUNTER — Ambulatory Visit (INDEPENDENT_AMBULATORY_CARE_PROVIDER_SITE_OTHER): Payer: No Typology Code available for payment source | Admitting: Obstetrics and Gynecology

## 2020-01-18 VITALS — BP 120/80 | Ht 64.0 in | Wt 215.0 lb

## 2020-01-18 DIAGNOSIS — Z01419 Encounter for gynecological examination (general) (routine) without abnormal findings: Secondary | ICD-10-CM

## 2020-01-18 DIAGNOSIS — Z90710 Acquired absence of both cervix and uterus: Secondary | ICD-10-CM | POA: Diagnosis not present

## 2020-01-18 DIAGNOSIS — Z3041 Encounter for surveillance of contraceptive pills: Secondary | ICD-10-CM

## 2020-01-18 DIAGNOSIS — Z1231 Encounter for screening mammogram for malignant neoplasm of breast: Secondary | ICD-10-CM

## 2020-01-18 MED ORDER — LEVONORGESTREL-ETHINYL ESTRAD 0.1-20 MG-MCG PO TABS: 1.0000 | ORAL_TABLET | Freq: Every day | ORAL | 3 refills | Status: DC

## 2020-01-18 NOTE — Patient Instructions (Signed)
I value your feedback and entrusting us with your care. If you get a [facility] patient survey, I would appreciate you taking the time to let us know about your experience today. Thank you! ° °As of November 26, 2019, your lab results will be released to your MyChart immediately, before I even have a chance to see them. Please give me time to review them and contact you if there are any abnormalities. Thank you for your patience.  ° °[facility]: [phone] ° ° ° °

## 2020-05-17 ENCOUNTER — Encounter: Payer: Self-pay | Admitting: Obstetrics and Gynecology

## 2020-05-18 ENCOUNTER — Other Ambulatory Visit: Payer: Self-pay | Admitting: Obstetrics and Gynecology

## 2020-05-18 DIAGNOSIS — R102 Pelvic and perineal pain: Secondary | ICD-10-CM

## 2020-05-18 NOTE — Telephone Encounter (Signed)
Patient is scheduled for 05/19/20 at 11:30. Please place ultrasound order. Thank you!

## 2020-05-18 NOTE — Telephone Encounter (Signed)
Pls call pt to get her scheduled for a GYN u/s. She can alternate tylenol with ibup. Can only do 800 mg TID not QID (max dose is 2400 mg daily). Can do heating pad as well. I will call pt with results. If sx worsening, needs to f/u with MD. Corrin Parker

## 2020-05-18 NOTE — Telephone Encounter (Signed)
Patient aware. Transferred to Carloyn Jaeger for U/S apt scheduling

## 2020-05-19 ENCOUNTER — Other Ambulatory Visit: Payer: Self-pay

## 2020-05-19 ENCOUNTER — Other Ambulatory Visit: Payer: Self-pay | Admitting: Obstetrics and Gynecology

## 2020-05-19 ENCOUNTER — Ambulatory Visit (INDEPENDENT_AMBULATORY_CARE_PROVIDER_SITE_OTHER): Payer: No Typology Code available for payment source

## 2020-05-19 DIAGNOSIS — R102 Pelvic and perineal pain: Secondary | ICD-10-CM | POA: Diagnosis not present

## 2020-05-19 DIAGNOSIS — R1032 Left lower quadrant pain: Secondary | ICD-10-CM

## 2020-05-19 NOTE — Telephone Encounter (Signed)
Order cosigned.

## 2020-07-11 ENCOUNTER — Encounter: Payer: Self-pay | Admitting: Internal Medicine

## 2020-07-11 ENCOUNTER — Other Ambulatory Visit: Payer: Self-pay

## 2020-07-11 ENCOUNTER — Ambulatory Visit (INDEPENDENT_AMBULATORY_CARE_PROVIDER_SITE_OTHER): Payer: No Typology Code available for payment source | Admitting: Internal Medicine

## 2020-07-11 VITALS — BP 120/84 | HR 73 | Temp 97.6°F | Ht 64.0 in | Wt 222.0 lb

## 2020-07-11 DIAGNOSIS — E119 Type 2 diabetes mellitus without complications: Secondary | ICD-10-CM | POA: Diagnosis not present

## 2020-07-11 DIAGNOSIS — B353 Tinea pedis: Secondary | ICD-10-CM | POA: Diagnosis not present

## 2020-07-11 DIAGNOSIS — Z0000 Encounter for general adult medical examination without abnormal findings: Secondary | ICD-10-CM | POA: Diagnosis not present

## 2020-07-11 DIAGNOSIS — K219 Gastro-esophageal reflux disease without esophagitis: Secondary | ICD-10-CM

## 2020-07-11 DIAGNOSIS — Z1211 Encounter for screening for malignant neoplasm of colon: Secondary | ICD-10-CM

## 2020-07-11 DIAGNOSIS — Z0001 Encounter for general adult medical examination with abnormal findings: Secondary | ICD-10-CM

## 2020-07-11 LAB — CBC
HCT: 35.8 % — ABNORMAL LOW (ref 36.0–46.0)
Hemoglobin: 11.4 g/dL — ABNORMAL LOW (ref 12.0–15.0)
MCHC: 31.8 g/dL (ref 30.0–36.0)
MCV: 71.8 fl — ABNORMAL LOW (ref 78.0–100.0)
Platelets: 401 10*3/uL — ABNORMAL HIGH (ref 150.0–400.0)
RBC: 4.99 Mil/uL (ref 3.87–5.11)
RDW: 16.1 % — ABNORMAL HIGH (ref 11.5–15.5)
WBC: 5.9 10*3/uL (ref 4.0–10.5)

## 2020-07-11 LAB — COMPREHENSIVE METABOLIC PANEL
ALT: 14 U/L (ref 0–35)
AST: 14 U/L (ref 0–37)
Albumin: 4.1 g/dL (ref 3.5–5.2)
Alkaline Phosphatase: 50 U/L (ref 39–117)
BUN: 14 mg/dL (ref 6–23)
CO2: 28 mEq/L (ref 19–32)
Calcium: 9 mg/dL (ref 8.4–10.5)
Chloride: 108 mEq/L (ref 96–112)
Creatinine, Ser: 0.85 mg/dL (ref 0.40–1.20)
GFR: 87.24 mL/min (ref >60.00)
Glucose, Bld: 100 mg/dL — ABNORMAL HIGH (ref 70–99)
Potassium: 4.3 mEq/L (ref 3.5–5.1)
Sodium: 141 mEq/L (ref 135–145)
Total Bilirubin: 0.3 mg/dL (ref 0.2–1.2)
Total Protein: 6.6 g/dL (ref 6.0–8.3)

## 2020-07-11 LAB — LIPID PANEL
Cholesterol: 182 mg/dL (ref 0–200)
HDL: 47.9 mg/dL (ref >39.00)
LDL Cholesterol: 122 mg/dL — ABNORMAL HIGH (ref 0–99)
NonHDL: 133.99
Total CHOL/HDL Ratio: 4
Triglycerides: 59 mg/dL (ref 0.0–149.0)
VLDL: 11.8 mg/dL (ref 0.0–40.0)

## 2020-07-11 LAB — HEMOGLOBIN A1C: Hgb A1c MFr Bld: 6 % (ref 4.6–6.5)

## 2020-07-11 LAB — VITAMIN D 25 HYDROXY (VIT D DEFICIENCY, FRACTURES): VITD: 46.88 ng/mL (ref 30.00–100.00)

## 2020-07-11 MED ORDER — CLOTRIMAZOLE-BETAMETHASONE 1-0.05 % EX CREA: TOPICAL_CREAM | CUTANEOUS | 0 refills | Status: DC

## 2020-07-11 NOTE — Patient Instructions (Signed)
Health Maintenance, Female Adopting a healthy lifestyle and getting preventive care are important in promoting health and wellness. Ask your health care provider about:  The right schedule for you to have regular tests and exams.  Things you can do on your own to prevent diseases and keep yourself healthy. What should I know about diet, weight, and exercise? Eat a healthy diet   Eat a diet that includes plenty of vegetables, fruits, low-fat dairy products, and lean protein.  Do not eat a lot of foods that are high in solid fats, added sugars, or sodium. Maintain a healthy weight Body mass index (BMI) is used to identify weight problems. It estimates body fat based on height and weight. Your health care provider can help determine your BMI and help you achieve or maintain a healthy weight. Get regular exercise Get regular exercise. This is one of the most important things you can do for your health. Most adults should:  Exercise for at least 150 minutes each week. The exercise should increase your heart rate and make you sweat (moderate-intensity exercise).  Do strengthening exercises at least twice a week. This is in addition to the moderate-intensity exercise.  Spend less time sitting. Even light physical activity can be beneficial. Watch cholesterol and blood lipids Have your blood tested for lipids and cholesterol at 46 years of age, then have this test every 5 years. Have your cholesterol levels checked more often if:  Your lipid or cholesterol levels are high.  You are older than 46 years of age.  You are at high risk for heart disease. What should I know about cancer screening? Depending on your health history and family history, you may need to have cancer screening at various ages. This may include screening for:  Breast cancer.  Cervical cancer.  Colorectal cancer.  Skin cancer.  Lung cancer. What should I know about heart disease, diabetes, and high blood  pressure? Blood pressure and heart disease  High blood pressure causes heart disease and increases the risk of stroke. This is more likely to develop in people who have high blood pressure readings, are of African descent, or are overweight.  Have your blood pressure checked: ? Every 3-5 years if you are 18-39 years of age. ? Every year if you are 40 years old or older. Diabetes Have regular diabetes screenings. This checks your fasting blood sugar level. Have the screening done:  Once every three years after age 40 if you are at a normal weight and have a low risk for diabetes.  More often and at a younger age if you are overweight or have a high risk for diabetes. What should I know about preventing infection? Hepatitis B If you have a higher risk for hepatitis B, you should be screened for this virus. Talk with your health care provider to find out if you are at risk for hepatitis B infection. Hepatitis C Testing is recommended for:  Everyone born from 1945 through 1965.  Anyone with known risk factors for hepatitis C. Sexually transmitted infections (STIs)  Get screened for STIs, including gonorrhea and chlamydia, if: ? You are sexually active and are younger than 46 years of age. ? You are older than 46 years of age and your health care provider tells you that you are at risk for this type of infection. ? Your sexual activity has changed since you were last screened, and you are at increased risk for chlamydia or gonorrhea. Ask your health care provider if   you are at risk.  Ask your health care provider about whether you are at high risk for HIV. Your health care provider may recommend a prescription medicine to help prevent HIV infection. If you choose to take medicine to prevent HIV, you should first get tested for HIV. You should then be tested every 3 months for as long as you are taking the medicine. Pregnancy  If you are about to stop having your period (premenopausal) and  you may become pregnant, seek counseling before you get pregnant.  Take 400 to 800 micrograms (mcg) of folic acid every day if you become pregnant.  Ask for birth control (contraception) if you want to prevent pregnancy. Osteoporosis and menopause Osteoporosis is a disease in which the bones lose minerals and strength with aging. This can result in bone fractures. If you are 65 years old or older, or if you are at risk for osteoporosis and fractures, ask your health care provider if you should:  Be screened for bone loss.  Take a calcium or vitamin D supplement to lower your risk of fractures.  Be given hormone replacement therapy (HRT) to treat symptoms of menopause. Follow these instructions at home: Lifestyle  Do not use any products that contain nicotine or tobacco, such as cigarettes, e-cigarettes, and chewing tobacco. If you need help quitting, ask your health care provider.  Do not use street drugs.  Do not share needles.  Ask your health care provider for help if you need support or information about quitting drugs. Alcohol use  Do not drink alcohol if: ? Your health care provider tells you not to drink. ? You are pregnant, may be pregnant, or are planning to become pregnant.  If you drink alcohol: ? Limit how much you use to 0-1 drink a day. ? Limit intake if you are breastfeeding.  Be aware of how much alcohol is in your drink. In the [place] one drink equals one 12 oz bottle of beer (355 mL), one 5 oz glass of wine (148 mL), or one 1 oz glass of hard liquor (44 mL). General instructions  Schedule regular health, dental, and eye exams.  Stay current with your vaccines.  Tell your health care provider if: ? You often feel depressed. ? You have ever been abused or do not feel safe at home. Summary  Adopting a healthy lifestyle and getting preventive care are important in promoting health and wellness.  Follow your health care provider's instructions about healthy  diet, exercising, and getting tested or screened for diseases.  Follow your health care provider's instructions on monitoring your cholesterol and blood pressure. This information is not intended to replace advice given to you by your health care provider. Make sure you discuss any questions you have with your health care provider. Document Revised: 11/26/2018 Document Reviewed: 11/26/2018 Elsevier Patient Education  2020 Elsevier Inc.  

## 2020-07-11 NOTE — Progress Notes (Signed)
Subjective:    Patient ID: Alejandra Watkins, female    DOB: [birth year], 44 y.o.   MRN: [id number]  HPI  Patient presents the clinic today for her annual exam.  She is also due to follow-up chronic conditions.  GERD: Triggered by fried foods.  She denies breakthrough on Pantoprazole. She has failed weaning in the past. There is no upper GI on file.  DM2: Her last A1c was 6%, 06/2019.  She is not currently taking any oral diabetic medication at this time.  She does not check her sugars routinely.  She does check her feet routinely.  Flu: 09/2019 Tetanus: unsure Pneumovax: never Covid: Pfizer Pap smear: 08/2017, partial hysterectomy Mammogram: 11/2019 Colon screening: never Vision screening: annually Dentist: biannually  Diet: She does eat meat. She consumes fruits and veggies daily. She tries to avoid fried foods. She drinks mostly water, some coffee, Dt. Dr. Malachi Bonds, Powerade occasionally. Exercise: None  Review of Systems      Past Medical History:  Diagnosis Date  . Acid reflux   . Anemia   . Diabetes mellitus without complication ([place])   . Family history of colon cancer   . History of chicken pox   . Leiomyoma   . Ovarian cyst    seen at [facility]    Current Outpatient Medications  Medication Sig Dispense Refill  . Biotin 10 MG CAPS Take by mouth.    . Cholecalciferol (VITAMIN D3) 5000 units CAPS Take 1 capsule by mouth daily.    . clotrimazole-betamethasone (LOTRISONE) cream Apply externally BID prn sx up to 2 wks 15 g 0  . glucose blood (BAYER CONTOUR NEXT TEST) test strip 1 each by Other route 2 (two) times daily as needed for other. Use as instructed 200 each 2  . ketoconazole (NIZORAL) 2 % cream     . levocetirizine (XYZAL) 5 MG tablet     . levonorgestrel-ethinyl estradiol (AVIANE) 0.1-20 MG-MCG tablet Take 1 tablet by mouth daily. 84 tablet 3  . montelukast (SINGULAIR) 10 MG tablet     . Multiple Vitamins-Minerals (MULTIVITAMIN WOMEN) TABS Take 1 tablet by mouth  daily.    . pantoprazole (PROTONIX) 40 MG tablet Take 1 tablet by mouth daily.  3  . vitamin B-12 (CYANOCOBALAMIN) 1000 MCG tablet Take 1,000 mcg by mouth daily.     No current facility-administered medications for this visit.    No Known Allergies  Family History  Problem Relation Age of Onset  . Hypertension Mother   . Alcoholism Father   . Hypertension Paternal Grandmother   . Diabetes Paternal Grandmother        type 2  . Colon cancer Other   . Diabetes Other        type 2  . Hypertension Other   . Diabetes Other        type 2  . Stroke Other   . Lung cancer Paternal Uncle   . Heart disease Neg Hx   . Breast cancer Neg Hx     Social History   Socioeconomic History  . Marital status: Married    Spouse name: Jori Moll  . Number of children: 3  . Years of education: Not on file  . Highest education level: Not on file  Occupational History  . 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[employer]  Tobacco Use  . Smoking status: Never Smoker  . Smokeless tobacco: Never Used  Vaping Use  . Vaping Use: Never used  Substance and Sexual  Activity  . Alcohol use: Yes    Alcohol/week: 1.0 standard drink    Types: 1 Glasses of wine per week    Comment: occasional  . Drug use: No  . Sexual activity: Yes    Birth control/protection: Surgical    Comment: Hysterectomy  Other Topics Concern  . Not on file  Social History Narrative  . Not on file   Social Determinants of Health   Financial Resource Strain:   . Difficulty of Paying Living Expenses:   Food Insecurity:   . Worried About Charity fundraiser in the Last Year:   . Arboriculturist in the Last Year:   Transportation Needs:   . Film/video editor (Medical):   Marland Kitchen Lack of Transportation (Non-Medical):   Physical Activity:   . Days of Exercise per Week:   . Minutes of Exercise per Session:   Stress:   . Feeling of Stress :   Social Connections:   . Frequency of Communication with Friends and  Family:   . Frequency of Social Gatherings with Friends and Family:   . Attends Religious Services:   . Active Member of Clubs or Organizations:   . Attends Archivist Meetings:   Marland Kitchen Marital Status:   Intimate Partner Violence:   . Fear of Current or Ex-Partner:   . Emotionally Abused:   Marland Kitchen Physically Abused:   . Sexually Abused:      Constitutional: Denies fever, malaise, fatigue, headache or abrupt weight changes.  HEENT: Denies eye pain, eye redness, ear pain, ringing in the ears, wax buildup, runny nose, nasal congestion, bloody nose, or sore throat. Respiratory: Denies difficulty breathing, shortness of breath, cough or sputum production.   Cardiovascular: Denies chest pain, chest tightness, palpitations or swelling in the hands or feet.  Gastrointestinal: Pt reports intermittent constipation. Denies abdominal pain, bloating,  diarrhea or blood in the stool.  GU: Denies urgency, frequency, pain with urination, burning sensation, blood in urine, odor or discharge. Musculoskeletal: Denies decrease in range of motion, difficulty with gait, muscle pain or joint pain and swelling.  Skin: Pt report rash of right heel. Denies redness, lesions or ulcercations.  Neurological: Denies dizziness, difficulty with memory, difficulty with speech or problems with balance and coordination.  Psych: Denies anxiety, depression, SI/HI.  No other specific complaints in a complete review of systems (except as listed in HPI above).  Objective:   Physical Exam  BP 120/84   Pulse 73   Temp 97.6 F (36.4 C) (Temporal)   Ht 5' 4"  (1.626 m)   Wt (!) 222 lb (100.7 kg)   LMP  (LMP Unknown)   SpO2 98%   BMI 38.11 kg/m    Wt Readings from Last 3 Encounters:  01/18/20 215 lb (97.5 kg)  11/23/19 216 lb (98 kg)  07/09/19 216 lb (98 kg)    General: Appears her stated age, obese, in NAD. Skin: Warm, dry and intact. No ulcerations noted. Peeling skin of right heel concerning for fungal  infection. HEENT: Head: normal shape and size; Eyes: sclera white, no icterus, conjunctiva pink, PERRLA and EOMs intact;  Neck:  Neck supple, trachea midline. No masses, lumps or thyromegaly present.  Cardiovascular: Normal rate and rhythm. S1,S2 noted.  No murmur, rubs or gallops noted. No JVD or BLE edema.  Pulmonary/Chest: Normal effort and positive vesicular breath sounds. No respiratory distress. No wheezes, rales or ronchi noted.  Abdomen: Soft and nontender. Normal bowel sounds. No distention or masses noted.  Liver, spleen and kidneys non palpable. Musculoskeletal: Strength 5/5 BUE/BLE. No difficulty with gait.  Neurological: Alert and oriented. Cranial nerves II-XII grossly intact. Coordination normal.  Psychiatric: Mood and affect normal. Behavior is normal. Judgment and thought content normal.     BMET    Component Value Date/Time   NA 138 07/09/2019 0831   NA 141 01/07/2017 0926   NA 141 10/30/2014 1545   K 4.3 07/09/2019 0831   K 4.5 10/30/2014 1545   CL 103 07/09/2019 0831   CL 107 10/30/2014 1545   CO2 28 07/09/2019 0831   CO2 28 10/30/2014 1545   GLUCOSE 93 07/09/2019 0831   GLUCOSE 83 10/30/2014 1545   BUN 11 07/09/2019 0831   BUN 12 01/07/2017 0926   BUN 16 10/30/2014 1545   CREATININE 0.79 07/09/2019 0831   CREATININE 1.17 10/30/2014 1545   CALCIUM 9.8 07/09/2019 0831   CALCIUM 8.0 (L) 10/30/2014 1545   GFRNONAA 93 01/07/2017 0926   GFRNONAA 54 (L) 10/30/2014 1545   GFRNONAA >60 05/08/2012 1158   GFRAA 107 01/07/2017 0926   GFRAA >60 10/30/2014 1545   GFRAA >60 05/08/2012 1158    Lipid Panel     Component Value Date/Time   CHOL 186 07/09/2019 0831   CHOL 219 (H) 01/07/2017 0926   TRIG 76.0 07/09/2019 0831   HDL 41.40 07/09/2019 0831   HDL 53 01/07/2017 0926   CHOLHDL 4 07/09/2019 0831   VLDL 15.2 07/09/2019 0831   LDLCALC 129 (H) 07/09/2019 0831   LDLCALC 141 (H) 01/07/2017 0926    CBC    Component Value Date/Time   WBC 6.4 07/09/2019 0831     RBC 5.40 (H) 07/09/2019 0831   HGB 12.0 07/09/2019 0831   HGB 10.9 (L) 01/07/2017 1518   HCT 38.0 07/09/2019 0831   HCT 37.2 01/07/2017 1518   PLT 415.0 (H) 07/09/2019 0831   PLT 409 (H) 01/07/2017 1518   MCV 70.5 (L) 07/09/2019 0831   MCV 70 (L) 01/07/2017 1518   MCV 72 (L) 10/30/2014 1545   MCH 20.6 (L) 01/07/2017 1518   MCH 23.3 (L) 07/23/2016 2117   MCHC 31.5 07/09/2019 0831   RDW 15.8 (H) 07/09/2019 0831   RDW 17.5 (H) 01/07/2017 1518   RDW 15.5 (H) 10/30/2014 1545   LYMPHSABS 2.9 01/07/2017 1518   LYMPHSABS 2.6 10/30/2014 1545   MONOABS 1.1 (H) 07/23/2016 2117   MONOABS 0.8 10/30/2014 1545   EOSABS 0.2 01/07/2017 1518   EOSABS 0.1 10/30/2014 1545   BASOSABS 0.0 01/07/2017 1518   BASOSABS 0.1 10/30/2014 1545    Hgb A1C Lab Results  Component Value Date   HGBA1C 6.0 07/09/2019            Assessment & Plan:  Preventative Health Maintenance:  Encouraged her to get a flu shot in the fall Tetanus declined Pneumovax declined Covid UTD She no longer needs paps smears due to hysterectomy Mammogram scheduled for 11/2019 Referral to GI for screening colonoscopy Encouraged her to consume a balanced diet and exercise regimen Advised her to see an eye doctor and dentist annually We will check CBC, C met, lipid, A1c and vitamin D today  Tinea Pedis:  RX for Lotrisone cream- use as directed Consider dermatology referral if symptoms persist or worsen  RTC in 1 year, sooner if needed Webb Silversmith, NP This visit occurred during the SARS-CoV-2 public health emergency.  Safety protocols were in place, including screening questions prior to the visit, additional usage of staff PPE,  and extensive cleaning of exam room while observing appropriate contact time as indicated for disinfecting solutions.

## 2020-07-11 NOTE — Assessment & Plan Note (Signed)
Diet controlled A1C today Encouraged her to consume a low carb diet, exercise for weight loss Encouraged yearly eye exam Encouraged routine foot exams She declines flu or pneumovax Covid UTD

## 2020-07-11 NOTE — Assessment & Plan Note (Signed)
Has failed wean in the past Continue Pantoprazole CBC and CMET today Will monitor

## 2020-08-02 ENCOUNTER — Telehealth (INDEPENDENT_AMBULATORY_CARE_PROVIDER_SITE_OTHER): Payer: Self-pay | Admitting: Gastroenterology

## 2020-08-02 ENCOUNTER — Telehealth: Payer: Self-pay

## 2020-08-02 DIAGNOSIS — Z1211 Encounter for screening for malignant neoplasm of colon: Secondary | ICD-10-CM

## 2020-08-02 NOTE — Telephone Encounter (Signed)
Patient was scheduled for 8:30am nurse call to schedule colonoscopy.  I was unable to contact her.  I LVM for her to call me back directly if she got the message by 8:45am if she received message after 8:45am to call front office to reschedule.  Thanks,  [place], [place]

## 2020-08-02 NOTE — Progress Notes (Signed)
Unable to contact patient for 8:30 am triage.  LVM at 8:30 and 8:45 asked her to please call back to reschedule.  Thanks,  [place], [place]

## 2020-08-10 ENCOUNTER — Telehealth (INDEPENDENT_AMBULATORY_CARE_PROVIDER_SITE_OTHER): Payer: No Typology Code available for payment source | Admitting: Gastroenterology

## 2020-08-10 ENCOUNTER — Other Ambulatory Visit: Payer: Self-pay

## 2020-08-10 DIAGNOSIS — Z1211 Encounter for screening for malignant neoplasm of colon: Secondary | ICD-10-CM

## 2020-08-10 MED ORDER — NA SULFATE-K SULFATE-MG SULF 17.5-3.13-1.6 GM/177ML PO SOLN: 1.0000 | Freq: Once | ORAL | 0 refills | Status: AC

## 2020-08-10 NOTE — Progress Notes (Signed)
Gastroenterology Pre-Procedure Review  Request Date: Friday 09/09/20  Requesting Physician: Dr. Bonna Gains  PATIENT REVIEW QUESTIONS: The patient responded to the following health history questions as indicated:    1. Are you having any GI issues? no 2. Do you have a personal history of Polyps? no 3. Do you have a family history of Colon Cancer or Polyps? no 4. Diabetes Mellitus? yes (type 2) 5. Joint replacements in the past 12 months?no 6. Major health problems in the past 3 months?no 7. Any artificial heart valves, MVP, or defibrillator?no    MEDICATIONS & ALLERGIES:    Patient reports the following regarding taking any anticoagulation/antiplatelet therapy:   Plavix, Coumadin, Eliquis, Xarelto, Lovenox, Pradaxa, Brilinta, or Effient? no Aspirin? no  Patient confirms/reports the following medications:  Current Outpatient Medications  Medication Sig Dispense Refill  . Biotin 10 MG CAPS Take by mouth.    . Cholecalciferol (VITAMIN D3) 5000 units CAPS Take 1 capsule by mouth daily.    . clotrimazole-betamethasone (LOTRISONE) cream Apply externally BID prn sx up to 2 wks 15 g 0  . glucose blood (BAYER CONTOUR NEXT TEST) test strip 1 each by Other route 2 (two) times daily as needed for other. Use as instructed 200 each 2  . ketoconazole (NIZORAL) 2 % cream     . levocetirizine (XYZAL) 5 MG tablet     . levonorgestrel-ethinyl estradiol (AVIANE) 0.1-20 MG-MCG tablet Take 1 tablet by mouth daily. 84 tablet 3  . montelukast (SINGULAIR) 10 MG tablet     . Multiple Vitamins-Minerals (MULTIVITAMIN WOMEN) TABS Take 1 tablet by mouth daily.    . NON FORMULARY [place]    . pantoprazole (PROTONIX) 40 MG tablet Take 1 tablet by mouth daily.  3  . vitamin B-12 (CYANOCOBALAMIN) 1000 MCG tablet Take 1,000 mcg by mouth daily.    . Na Sulfate-K Sulfate-Mg Sulf 17.5-3.13-1.6 GM/177ML SOLN Take 1 kit by mouth once for 1 dose. 354 mL 0   No current facility-administered medications for this visit.     Patient confirms/reports the following allergies:  No Known Allergies  No orders of the defined types were placed in this encounter.   AUTHORIZATION INFORMATION Primary Insurance: 1D#: Group #:  Secondary Insurance: 1D#: Group #:  SCHEDULE INFORMATION: Date: 09/09/20 Time: Location:[facility]

## 2020-08-30 ENCOUNTER — Encounter: Payer: Self-pay | Admitting: Obstetrics and Gynecology

## 2020-08-30 NOTE — Telephone Encounter (Signed)
Pls call pt to sched f/u u/s for LTO cyst. Thx

## 2020-08-30 NOTE — Telephone Encounter (Signed)
Patient is scheduled for 09/05/20 for gyn u/s

## 2020-09-01 ENCOUNTER — Other Ambulatory Visit: Payer: Self-pay | Admitting: Allergy

## 2020-09-05 ENCOUNTER — Other Ambulatory Visit: Payer: Self-pay

## 2020-09-05 ENCOUNTER — Other Ambulatory Visit (INDEPENDENT_AMBULATORY_CARE_PROVIDER_SITE_OTHER): Payer: No Typology Code available for payment source

## 2020-09-05 ENCOUNTER — Other Ambulatory Visit: Payer: Self-pay | Admitting: Obstetrics and Gynecology

## 2020-09-05 DIAGNOSIS — N83202 Unspecified ovarian cyst, left side: Secondary | ICD-10-CM | POA: Diagnosis not present

## 2020-09-05 NOTE — Telephone Encounter (Signed)
LM with neg GYN u/s results. LTO cyst resolved. F/u prn

## 2020-09-07 ENCOUNTER — Other Ambulatory Visit: Payer: Self-pay

## 2020-09-07 ENCOUNTER — Encounter: Payer: Self-pay | Admitting: Obstetrics and Gynecology

## 2020-09-07 ENCOUNTER — Other Ambulatory Visit
Admission: RE | Admit: 2020-09-07 | Discharge: 2020-09-07 | Disposition: A | Discharge status: Home / Self Care | Payer: No Typology Code available for payment source | Source: Ambulatory Visit | Attending: Gastroenterology | Admitting: Gastroenterology

## 2020-09-07 DIAGNOSIS — Z01812 Encounter for preprocedural laboratory examination: Secondary | ICD-10-CM | POA: Diagnosis not present

## 2020-09-07 DIAGNOSIS — Z20822 Contact with and (suspected) exposure to covid-19: Secondary | ICD-10-CM | POA: Diagnosis not present

## 2020-09-07 LAB — SARS CORONAVIRUS 2 (TAT 6-24 HRS): SARS Coronavirus 2: NEGATIVE

## 2020-09-09 ENCOUNTER — Other Ambulatory Visit: Payer: Self-pay

## 2020-09-09 ENCOUNTER — Ambulatory Visit
Admission: RE | Admit: 2020-09-09 | Discharge: 2020-09-09 | Disposition: A | Discharge status: Home / Self Care | Payer: No Typology Code available for payment source | Attending: Gastroenterology | Admitting: Gastroenterology

## 2020-09-09 ENCOUNTER — Encounter
Admission: RE | Disposition: A | Discharge status: Home / Self Care | Payer: Self-pay | Source: Home / Self Care | Attending: Gastroenterology

## 2020-09-09 ENCOUNTER — Encounter: Payer: Self-pay | Admitting: Gastroenterology

## 2020-09-09 ENCOUNTER — Ambulatory Visit: Payer: No Typology Code available for payment source | Admitting: Certified Registered Nurse Anesthetist

## 2020-09-09 DIAGNOSIS — Z1211 Encounter for screening for malignant neoplasm of colon: Secondary | ICD-10-CM | POA: Diagnosis present

## 2020-09-09 DIAGNOSIS — K635 Polyp of colon: Secondary | ICD-10-CM | POA: Diagnosis not present

## 2020-09-09 DIAGNOSIS — D122 Benign neoplasm of ascending colon: Secondary | ICD-10-CM | POA: Insufficient documentation

## 2020-09-09 DIAGNOSIS — K219 Gastro-esophageal reflux disease without esophagitis: Secondary | ICD-10-CM | POA: Insufficient documentation

## 2020-09-09 DIAGNOSIS — E119 Type 2 diabetes mellitus without complications: Secondary | ICD-10-CM | POA: Diagnosis not present

## 2020-09-09 DIAGNOSIS — Z793 Long term (current) use of hormonal contraceptives: Secondary | ICD-10-CM | POA: Insufficient documentation

## 2020-09-09 DIAGNOSIS — Z833 Family history of diabetes mellitus: Secondary | ICD-10-CM | POA: Insufficient documentation

## 2020-09-09 HISTORY — PX: COLONOSCOPY WITH PROPOFOL: SHX5780

## 2020-09-09 SURGERY — COLONOSCOPY WITH PROPOFOL
Anesthesia: General

## 2020-09-09 MED ORDER — LABETALOL HCL 5 MG/ML IV SOLN
INTRAVENOUS | Status: AC
  Filled 2020-09-09: qty 4

## 2020-09-09 MED ORDER — PROPOFOL 10 MG/ML IV BOLUS
INTRAVENOUS | Status: DC | PRN
  Administered 2020-09-09: 60 mg via INTRAVENOUS
  Administered 2020-09-09: 40 mg via INTRAVENOUS

## 2020-09-09 MED ORDER — LIDOCAINE HCL (CARDIAC) PF 100 MG/5ML IV SOSY
PREFILLED_SYRINGE | INTRAVENOUS | Status: DC | PRN
  Administered 2020-09-09: 50 mg via INTRAVENOUS

## 2020-09-09 MED ORDER — PROPOFOL 500 MG/50ML IV EMUL
INTRAVENOUS | Status: AC
  Filled 2020-09-09: qty 50

## 2020-09-09 MED ORDER — LIDOCAINE HCL (PF) 2 % IJ SOLN
INTRAMUSCULAR | Status: AC
  Filled 2020-09-09: qty 5

## 2020-09-09 MED ORDER — SODIUM CHLORIDE 0.9 % IV SOLN: INTRAVENOUS | Status: DC

## 2020-09-09 MED ORDER — LABETALOL HCL 5 MG/ML IV SOLN
INTRAVENOUS | Status: DC | PRN
  Administered 2020-09-09: 5 mg via INTRAVENOUS

## 2020-09-09 MED ORDER — PROPOFOL 500 MG/50ML IV EMUL
INTRAVENOUS | Status: DC | PRN
  Administered 2020-09-09: 150 ug/kg/min via INTRAVENOUS
  Administered 2020-09-09: 175 ug/kg/min via INTRAVENOUS

## 2020-09-09 NOTE — Transfer of Care (Signed)
Immediate Anesthesia Transfer of Care Note  Patient: Alejandra Watkins  Procedure(s) Performed: COLONOSCOPY WITH PROPOFOL (N/A )  Patient Location: PACU  Anesthesia Type:General  Level of Consciousness: sedated  Airway & Oxygen Therapy: Patient Spontanous Breathing  Post-op Assessment: Report given to RN and Post -op Vital signs reviewed and stable  Post vital signs: Reviewed and stable  Last Vitals:  Vitals Value Taken Time  BP 121/80 09/09/20 0823  Temp    Pulse 93 09/09/20 0823  Resp 17 09/09/20 0823  SpO2 95 % 09/09/20 0823  Vitals shown include unvalidated device data.  Last Pain:  Vitals:   09/09/20 0820  TempSrc:   PainSc: Asleep         Complications: No complications documented.

## 2020-09-09 NOTE — Anesthesia Postprocedure Evaluation (Signed)
Anesthesia Post Note  Patient: Alejandra Watkins  Procedure(s) Performed: COLONOSCOPY WITH PROPOFOL (N/A )  Patient location during evaluation: Endoscopy Anesthesia Type: General Level of consciousness: awake and alert Pain management: pain level controlled Vital Signs Assessment: post-procedure vital signs reviewed and stable Respiratory status: spontaneous breathing, nonlabored ventilation, respiratory function stable and patient connected to nasal cannula oxygen Cardiovascular status: blood pressure returned to baseline and stable Postop Assessment: no apparent nausea or vomiting Anesthetic complications: no   No complications documented.   Last Vitals:  Vitals:   09/09/20 0840 09/09/20 0850  BP: 132/85 137/85  Pulse:    Resp:    Temp:    SpO2:      Last Pain:  Vitals:   09/09/20 0850  TempSrc:   PainSc: 0-No pain                 Martha Clan

## 2020-09-09 NOTE — Anesthesia Preprocedure Evaluation (Signed)
Anesthesia Evaluation  Patient identified by MRN, date of birth, ID band Patient awake    Reviewed: Allergy & Precautions, H&P , NPO status , Patient's Chart, lab work & pertinent test results, reviewed documented beta blocker date and time   History of Anesthesia Complications (+) PONV and history of anesthetic complications  Airway Mallampati: II  TM Distance: >3 FB Neck ROM: full    Dental  (+) Dental Advidsory Given, Teeth Intact   Pulmonary neg pulmonary ROS,    Pulmonary exam normal breath sounds clear to auscultation       Cardiovascular Exercise Tolerance: Good negative cardio ROS Normal cardiovascular exam Rhythm:regular Rate:Normal     Neuro/Psych negative neurological ROS  negative psych ROS   GI/Hepatic Neg liver ROS, GERD  ,  Endo/Other  diabetes  Renal/GU negative Renal ROS  negative genitourinary   Musculoskeletal   Abdominal   Peds  Hematology negative hematology ROS (+)   Anesthesia Other Findings Past Medical History: No date: Acid reflux No date: Anemia No date: Diabetes mellitus without complication ([facility]) No date: Family history of colon cancer No date: History of chicken pox No date: Leiomyoma No date: Ovarian cyst     Comment:  seen at [facility] [place]   Reproductive/Obstetrics negative OB ROS                             Anesthesia Physical Anesthesia Plan  ASA: II  Anesthesia Plan: General   Post-op Pain Management:    Induction: Intravenous  PONV Risk Score and Plan: 4 or greater and Propofol infusion and TIVA  Airway Management Planned: Natural Airway and Nasal Cannula  Additional Equipment:   Intra-op Plan:   Post-operative Plan:   Informed Consent: I have reviewed the patients History and Physical, chart, labs and discussed the procedure including the risks, benefits and alternatives for the proposed anesthesia with the patient or authorized  representative who has indicated his/her understanding and acceptance.     Dental Advisory Given  Plan Discussed with: Anesthesiologist, CRNA and Surgeon  Anesthesia Plan Comments:         Anesthesia Quick Evaluation

## 2020-09-09 NOTE — H&P (Signed)
Vonda Antigua, MD [address], [place], [place], [place], [address] [address], [place], [place], [place], [address] Phone: [phone]  Fax: [phone]  Primary Care Physician:  [name], NP   Pre-Procedure History & Physical: HPI:  Alejandra Watkins is a 44 y.o. female is here for a colonoscopy.   Past Medical History:  Diagnosis Date  . Acid reflux   . Anemia   . Diabetes mellitus without complication ([place])   . Family history of colon cancer   . History of chicken pox   . Leiomyoma   . Ovarian cyst    seen at [facility]    Past Surgical History:  Procedure Laterality Date  . ABDOMINAL HYSTERECTOMY     partial  . BREAST BIOPSY Left 15 yrs ago   needle biopsy. Benign  . OVARIAN CYST REMOVAL Left   . TUBAL LIGATION      Prior to Admission medications   Medication Sig Start Date End Date Taking? Authorizing Provider  Biotin 10 MG CAPS Take by mouth.   Yes [provider]  Cholecalciferol (VITAMIN D3) 5000 units CAPS Take 1 capsule by mouth daily.   Yes [provider]  Multiple Vitamins-Minerals (MULTIVITAMIN WOMEN) TABS Take 1 tablet by mouth daily.   Yes [provider]  NON FORMULARY Ashwanganda   Yes [provider]  vitamin B-12 (CYANOCOBALAMIN) 1000 MCG tablet Take 1,000 mcg by mouth daily.   Yes [provider]  clotrimazole-betamethasone (LOTRISONE) cream Apply externally BID prn sx up to 2 wks 07/11/20   [name], NP  glucose blood (BAYER CONTOUR NEXT TEST) test strip 1 each by Other route 2 (two) times daily as needed for other. Use as instructed 03/17/19   [name], NP  ketoconazole (NIZORAL) 2 % cream     [provider]  levocetirizine (XYZAL) 5 MG tablet  12/18/18   [provider]  levonorgestrel-ethinyl estradiol (AVIANE) 0.1-20 MG-MCG tablet Take 1 tablet by mouth daily. 04/23/83   Copland, Deirdre Evener, PA-C  montelukast (SINGULAIR) 10 MG tablet  12/18/18   [provider]  pantoprazole (PROTONIX) 40 MG tablet Take 1 tablet by mouth daily. 05/22/17   [provider]    Allergies as of 08/10/2020  . (No Known Allergies)    Family History  Problem Relation Age of Onset  . Hypertension Mother   . Alcoholism Father   . Hypertension Paternal Grandmother   . Diabetes Paternal Grandmother        type 2  . Colon cancer Other   . Diabetes Other        type 2  . Hypertension Other   . Diabetes Other        type 2  . Stroke Other   . Lung cancer Paternal Uncle   . Heart disease Neg Hx   . Breast cancer Neg Hx     Social History   Socioeconomic History  . Marital status: Married    Spouse name: Jori Moll  . Number of children: 3  . Years of education: Not on file  . Highest education level: Not on file  Occupational History  . 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[employer]  Tobacco Use  . Smoking status: Never Smoker  . Smokeless tobacco: Never Used  Vaping Use  . Vaping Use: Never used  Substance and Sexual Activity  . Alcohol use: Yes    Alcohol/week: 1.0 standard drink    Types: 1  Glasses of wine per week    Comment: occasional  . Drug use: No  . Sexual activity: Yes    Birth control/protection: Surgical    Comment: Hysterectomy  Other Topics Concern  . Not on file  Social History Narrative  . Not on file   Social Determinants of Health   Financial Resource Strain:   . Difficulty of Paying Living Expenses: Not on file  Food Insecurity:   . Worried About Charity fundraiser in the Last Year: Not on file  . Ran Out of Food in the Last Year: Not on file  Transportation Needs:   . Lack of Transportation (Medical): Not on file  . Lack of Transportation (Non-Medical): Not on file  Physical Activity:   . Days of Exercise per Week: Not on file  . Minutes of Exercise per Session: Not on file  Stress:   . Feeling of Stress : Not on file  Social Connections:   . Frequency of Communication with Friends and Family:  Not on file  . Frequency of Social Gatherings with Friends and Family: Not on file  . Attends Religious Services: Not on file  . Active Member of Clubs or Organizations: Not on file  . Attends Archivist Meetings: Not on file  . Marital Status: Not on file  Intimate Partner Violence:   . Fear of Current or Ex-Partner: Not on file  . Emotionally Abused: Not on file  . Physically Abused: Not on file  . Sexually Abused: Not on file    Review of Systems: See HPI, otherwise negative ROS  Physical Exam: BP 134/80   Pulse 72   Temp 98.3 F (36.8 C) (Oral)   Resp 18   Ht 5\' 4"  (1.626 m)   Wt 101.6 kg   LMP  (LMP Unknown)   SpO2 100%   BMI 38.45 kg/m  General:   Alert,  pleasant and cooperative in NAD Head:  Normocephalic and atraumatic. Neck:  Supple; no masses or thyromegaly. Lungs:  Clear throughout to auscultation, normal respiratory effort.    Heart:  +S1, +S2, Regular rate and rhythm, No edema. Abdomen:  Soft, nontender and nondistended. Normal bowel sounds, without guarding, and without rebound.   Neurologic:  Alert and  oriented x4;  grossly normal neurologically.  Impression/Plan: Alejandra Watkins is here for a colonoscopy to be performed for average risk screening.  Risks, benefits, limitations, and alternatives regarding  colonoscopy have been reviewed with the patient.  Questions have been answered.  All parties agreeable.   Virgel Manifold, MD  09/09/2020, 7:41 AM

## 2020-09-09 NOTE — Anesthesia Procedure Notes (Signed)
Date/Time: 09/09/2020 7:47 AM Performed by: Johnna Acosta, CRNA Pre-anesthesia Checklist: Patient identified, Emergency Drugs available, Suction available, Patient being monitored and Timeout performed Patient Re-evaluated:Patient Re-evaluated prior to induction Oxygen Delivery Method: Nasal cannula Preoxygenation: Pre-oxygenation with 100% oxygen Induction Type: IV induction

## 2020-09-09 NOTE — Op Note (Signed)
[facility] Patient Name: Alejandra Watkins Procedure Date: 09/09/2020 7:25 AM MRN: [id number] Account #: [id number] Date of Birth: 01-10-1976 Admit Type: Outpatient Age: 44 Room: [facility] ENDO ROOM 2 Gender: Female Note Status: Finalized Procedure:             Colonoscopy Indications:           Screening for colorectal malignant neoplasm Providers:             Cresta Riden B. Bonna Gains MD, MD Referring MD:          [name] (Referring MD) Medicines:             Monitored Anesthesia Care Complications:         No immediate complications. Procedure:             Pre-Anesthesia Assessment:                        - ASA Grade Assessment: II - A patient with mild                         systemic disease.                        - Prior to the procedure, a History and Physical was                         performed, and patient medications, allergies and                         sensitivities were reviewed. The patient's tolerance                         of previous anesthesia was reviewed.                        - The risks and benefits of the procedure and the                         sedation options and risks were discussed with the                         patient. All questions were answered and informed                         consent was obtained.                        - Patient identification and proposed procedure were                         verified prior to the procedure by the physician, the                         nurse, the anesthesiologist, the anesthetist and the                         technician. The procedure was verified in the                         procedure room.  After obtaining informed consent, the colonoscope was                         passed under direct vision. Throughout the procedure,                         the patient's blood pressure, pulse, and oxygen                         saturations were monitored  continuously. The                         Colonoscope was introduced through the anus and                         advanced to the the cecum, identified by appendiceal                         orifice and ileocecal valve. The colonoscopy was                         performed with ease. The patient tolerated the                         procedure well. The quality of the bowel preparation                         was good. Findings:      The perianal and digital rectal examinations were normal.      A 4 mm polyp was found in the ascending colon. The polyp was flat.       Polypectomy was attempted, initially using a cold snare. Polyp resection       was incomplete with this device. This intervention then required a       different device and polypectomy technique. The polyp was removed with a       jumbo cold forceps. Resection and retrieval were complete. The cold       snare could not grab the polyp to remove it at all. Hence it was removed       completely via jumbo forceps. No pieces were removed via the snare.      The exam was otherwise without abnormality.      The rectum, sigmoid colon, descending colon, transverse colon, ascending       colon and cecum appeared normal.      The retroflexed view of the distal rectum and anal verge was normal and       showed no anal or rectal abnormalities. Impression:            - One 4 mm polyp in the ascending colon, removed with                         a jumbo cold forceps. Resected and retrieved.                        - The examination was otherwise normal.                        - The rectum, sigmoid colon, descending colon,  transverse colon, ascending colon and cecum are normal.                        - The distal rectum and anal verge are normal on                         retroflexion view. Recommendation:        - Await pathology results.                        - Discharge patient to home (with escort).                         - Advance diet as tolerated.                        - Continue present medications.                        - Repeat colonoscopy in 5 years if polyps show                         adenoma, 10 years if they are hyperplastic.                        - The findings and recommendations were discussed with                         the patient.                        - The findings and recommendations were discussed with                         the patient's family.                        - Return to primary care physician as previously                         scheduled. Procedure Code(s):     --- Professional ---                        [phone], Colonoscopy, flexible; with biopsy, single or                         multiple Diagnosis Code(s):     --- Professional ---                        Z12.11, Encounter for screening for malignant neoplasm                         of colon                        K63.5, Polyp of colon CPT copyright 2019 American Medical Association. All rights reserved. The codes documented in this report are preliminary and upon coder review may  be revised to meet current compliance requirements.  Vonda Antigua, MD Margretta Sidle B. Amarah Brossman MD, MD 09/09/2020 8:22:17 AM This report has  been signed electronically. Number of Addenda: 0 Note Initiated On: 09/09/2020 7:25 AM Scope Withdrawal Time: 0 hours 15 minutes 11 seconds  Total Procedure Duration: 0 hours 25 minutes 23 seconds  Estimated Blood Loss:  Estimated blood loss: none.      [facility]

## 2020-09-12 ENCOUNTER — Encounter: Payer: Self-pay | Admitting: Gastroenterology

## 2020-09-12 LAB — SURGICAL PATHOLOGY

## 2020-11-02 LAB — HM DIABETES EYE EXAM

## 2020-11-04 ENCOUNTER — Other Ambulatory Visit (INDEPENDENT_AMBULATORY_CARE_PROVIDER_SITE_OTHER): Payer: Self-pay | Admitting: Nurse Practitioner

## 2020-11-04 ENCOUNTER — Encounter: Payer: Self-pay | Admitting: Internal Medicine

## 2020-11-04 ENCOUNTER — Other Ambulatory Visit (INDEPENDENT_AMBULATORY_CARE_PROVIDER_SITE_OTHER): Payer: Self-pay | Admitting: Vascular Surgery

## 2020-11-04 NOTE — Telephone Encounter (Signed)
Pt has not been seen here is this ok to fill?

## 2021-01-02 ENCOUNTER — Other Ambulatory Visit: Payer: Self-pay | Admitting: Obstetrics and Gynecology

## 2021-01-02 DIAGNOSIS — Z3041 Encounter for surveillance of contraceptive pills: Secondary | ICD-10-CM

## 2021-01-03 ENCOUNTER — Other Ambulatory Visit: Payer: Self-pay | Admitting: Obstetrics and Gynecology

## 2021-01-26 ENCOUNTER — Other Ambulatory Visit: Payer: Self-pay

## 2021-01-26 ENCOUNTER — Encounter: Payer: Self-pay | Admitting: Obstetrics and Gynecology

## 2021-01-26 ENCOUNTER — Other Ambulatory Visit (HOSPITAL_COMMUNITY)
Admission: RE | Admit: 2021-01-26 | Discharge: 2021-01-26 | Disposition: A | Discharge status: Home / Self Care | Payer: No Typology Code available for payment source | Source: Ambulatory Visit | Attending: Obstetrics and Gynecology | Admitting: Obstetrics and Gynecology

## 2021-01-26 ENCOUNTER — Ambulatory Visit (INDEPENDENT_AMBULATORY_CARE_PROVIDER_SITE_OTHER): Payer: No Typology Code available for payment source | Admitting: Obstetrics and Gynecology

## 2021-01-26 ENCOUNTER — Other Ambulatory Visit: Payer: Self-pay | Admitting: Obstetrics and Gynecology

## 2021-01-26 VITALS — BP 118/80 | Ht 64.0 in | Wt 246.0 lb

## 2021-01-26 DIAGNOSIS — Z1329 Encounter for screening for other suspected endocrine disorder: Secondary | ICD-10-CM

## 2021-01-26 DIAGNOSIS — Z124 Encounter for screening for malignant neoplasm of cervix: Secondary | ICD-10-CM | POA: Insufficient documentation

## 2021-01-26 DIAGNOSIS — Z01419 Encounter for gynecological examination (general) (routine) without abnormal findings: Secondary | ICD-10-CM | POA: Diagnosis not present

## 2021-01-26 DIAGNOSIS — Z3041 Encounter for surveillance of contraceptive pills: Secondary | ICD-10-CM | POA: Diagnosis not present

## 2021-01-26 DIAGNOSIS — Z1151 Encounter for screening for human papillomavirus (HPV): Secondary | ICD-10-CM | POA: Insufficient documentation

## 2021-01-26 DIAGNOSIS — Z1231 Encounter for screening mammogram for malignant neoplasm of breast: Secondary | ICD-10-CM

## 2021-01-26 DIAGNOSIS — N951 Menopausal and female climacteric states: Secondary | ICD-10-CM

## 2021-01-26 MED ORDER — LEVONORGESTREL-ETHINYL ESTRAD 0.1-20 MG-MCG PO TABS: 1.0000 | ORAL_TABLET | Freq: Every day | ORAL | 3 refills | Status: DC

## 2021-01-26 NOTE — Progress Notes (Signed)
PCP:  [name], NP   Chief Complaint  Patient presents with  . Gynecologic Exam    Night sweats, brain fog, fatigue     HPI:      [name] is a 45 y.o. G3P3000 who LMP was No LMP recorded (lmp unknown). Patient has had a hysterectomy., presents today for her annual examination.  Her menses are absent due to lap supracx hyst 2013 due to [place]. She does not have PMB. Pt on OCPs for hx of recurrent ovar cysts, started by [facility] a couple yrs ago. Pt is high risk for surgery. Breast tenderness resolved once changed to lower dose OCPs last yr.   Having night sweats, brain fog, decreased libido, fatigue recently. Under increased stress, not exercising. Normal DM screen 7/21, no recent TSH.   Sex activity: single partner, contraception - status post hysterectomy. Has decreased libido and vaginal dryness. Hasn't tried lubricants yet.  Last Pap: 08/27/17  Results were: no abnormalities /neg HPV DNA. Still has cx Hx of STDs: none  Last mammogram: 12/02/19  Results: no abnormalities, repeat in a 1 yr There is no FH of breast cancer. There is no FH of ovarian cancer. The patient does do self-breast exams.  Tobacco use: The patient denies current or previous tobacco use. Alcohol use: social drinker No drug use.  Exercise: not active  Colonoscopy: 9/21 with polyp, repeat after 5 yrs.   She does get adequate calcium and Vitamin D in her diet.  Labs with PCP.   Past Medical History:  Diagnosis Date  . Acid reflux   . Anemia   . Diabetes mellitus without complication ([place])   . Family history of colon cancer   . History of chicken pox   . Leiomyoma   . Ovarian cyst    seen at [facility]    Past Surgical History:  Procedure Laterality Date  . BREAST BIOPSY Left 15 yrs ago   needle biopsy. Benign  . COLONOSCOPY WITH PROPOFOL N/A 09/09/2020   Procedure: COLONOSCOPY WITH PROPOFOL;  Surgeon: Virgel Manifold, MD;  Location: [facility] ENDOSCOPY;  Service: Endoscopy;  Laterality: N/A;   . LAPAROSCOPIC SUPRACERVICAL HYSTERECTOMY  2013  . OVARIAN CYST REMOVAL Left   . TUBAL LIGATION      Family History  Problem Relation Age of Onset  . Hypertension Mother   . Alcoholism Father   . Hypertension Paternal Grandmother   . Diabetes Paternal Grandmother        type 2  . Colon cancer Other   . Diabetes Other        type 2  . Hypertension Other   . Diabetes Other        type 2  . Stroke Other   . Lung cancer Paternal Uncle   . Heart disease Neg Hx   . Breast cancer Neg Hx     Social History   Socioeconomic History  . Marital status: Married    Spouse name: Jori Moll  . Number of children: 3  . Years of education: Not on file  . Highest education level: Not on file  Occupational History  . 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[employer]  Tobacco Use  . Smoking status: Never Smoker  . Smokeless tobacco: Never Used  Vaping Use  . Vaping Use: Never used  Substance and Sexual Activity  . Alcohol use: Yes    Alcohol/week: 1.0 standard drink    Types: 1 Glasses of wine per week  Comment: occasional  . Drug use: No  . Sexual activity: Yes    Birth control/protection: Surgical    Comment: Hysterectomy  Other Topics Concern  . Not on file  Social History Narrative  . Not on file   Social Determinants of Health   Financial Resource Strain: Not on file  Food Insecurity: Not on file  Transportation Needs: Not on file  Physical Activity: Not on file  Stress: Not on file  Social Connections: Not on file  Intimate Partner Violence: Not on file    Current Meds  Medication Sig  . azelastine (OPTIVAR) 0.05 % ophthalmic solution   . Biotin 10 MG CAPS Take by mouth.  . Cholecalciferol (VITAMIN D3) 5000 units CAPS Take 1 capsule by mouth daily.  . clotrimazole-betamethasone (LOTRISONE) cream Apply externally BID prn sx up to 2 wks  . glucose blood (BAYER CONTOUR NEXT TEST) test strip 1 each by Other route 2 (two) times daily as needed for other.  Use as instructed  . ketoconazole (NIZORAL) 2 % cream   . levocetirizine (XYZAL) 5 MG tablet   . montelukast (SINGULAIR) 10 MG tablet   . Multiple Vitamins-Minerals (MULTIVITAMIN WOMEN) TABS Take 1 tablet by mouth daily.  . NON FORMULARY Ashwanganda  . pantoprazole (PROTONIX) 40 MG tablet TAKE ONE TABLET BY MOUTH DAILY  . vitamin B-12 (CYANOCOBALAMIN) 1000 MCG tablet Take 1,000 mcg by mouth daily.  . [DISCONTINUED] levonorgestrel-ethinyl estradiol (ALESSE) 0.1-20 MG-MCG tablet TAKE 1 TABLET BY MOUTH DAILY.     ROS:  Review of Systems  Constitutional: Negative for fatigue, fever and unexpected weight change.  Respiratory: Negative for cough, shortness of breath and wheezing.   Cardiovascular: Negative for chest pain, palpitations and leg swelling.  Gastrointestinal: Negative for blood in stool, constipation, diarrhea, nausea and vomiting.  Endocrine: Negative for cold intolerance, heat intolerance and polyuria.  Genitourinary: Negative for dyspareunia, dysuria, flank pain, frequency, genital sores, hematuria, menstrual problem, pelvic pain, urgency, vaginal bleeding, vaginal discharge and vaginal pain.  Musculoskeletal: Negative for back pain, joint swelling and myalgias.  Skin: Negative for rash.  Neurological: Negative for dizziness, syncope, light-headedness, numbness and headaches.  Hematological: Negative for adenopathy.  Psychiatric/Behavioral: Positive for agitation. Negative for confusion, sleep disturbance and suicidal ideas. The patient is not nervous/anxious.      Objective: BP 118/80   Ht 5\' 4"  (1.626 m)   Wt 246 lb (111.6 kg)   LMP  (LMP Unknown)   BMI 42.23 kg/m    Physical Exam Constitutional:      Appearance: She is well-developed.  Genitourinary:     Vulva normal.     Right Labia: No rash, tenderness or lesions.    Left Labia: No tenderness, lesions or rash.    No vaginal discharge, erythema or tenderness.      Right Adnexa: not tender and no mass  present.    Left Adnexa: not tender and no mass present.    No cervical friability or polyp.     Uterus is not enlarged or tender.     Uterus is absent.  Breasts:     Right: No mass, nipple discharge, skin change or tenderness.     Left: No mass, nipple discharge, skin change or tenderness.    Neck:     Thyroid: No thyromegaly.  Cardiovascular:     Rate and Rhythm: Normal rate and regular rhythm.     Heart sounds: Normal heart sounds. No murmur heard.   Pulmonary:     Effort: Pulmonary  effort is normal.     Breath sounds: Normal breath sounds.  Abdominal:     Palpations: Abdomen is soft.     Tenderness: There is no abdominal tenderness. There is no guarding or rebound.  Musculoskeletal:        General: Normal range of motion.     Cervical back: Normal range of motion.  Lymphadenopathy:     Cervical: No cervical adenopathy.  Neurological:     General: No focal deficit present.     Mental Status: She is alert and oriented to person, place, and time.     Cranial Nerves: No cranial nerve deficit.  Skin:    General: Skin is warm and dry.  Psychiatric:        Mood and Affect: Mood normal.        Behavior: Behavior normal.        Thought Content: Thought content normal.        Judgment: Judgment normal.  Vitals reviewed.     Assessment/Plan: Encounter for annual routine gynecological examination  Cervical cancer screening - Plan: Cytology - PAP  Screening for HPV (human papillomavirus) - Plan: Cytology - PAP  Encounter for surveillance of contraceptive pills - Plan: levonorgestrel-ethinyl estradiol (ALESSE) 0.1-20 MG-MCG tablet; OCP RF for ovar cysts  Encounter for screening mammogram for malignant neoplasm of breast - Plan: MM 3D SCREEN BREAST BILATERAL; pt to sched mammo  Perimenopausal vasomotor symptoms - Plan: TSH + free T4; check labs. Pt on adequate hormones with OCPs. May be stress related. Increase exercise.   Thyroid disorder screening - Plan: TSH + free  T4    Meds ordered this encounter  Medications  . levonorgestrel-ethinyl estradiol (ALESSE) 0.1-20 MG-MCG tablet    Sig: Take 1 tablet by mouth daily.    Dispense:  84 tablet    Refill:  3    Order Specific Question:   Supervising Provider    Answer:   Gae Dry [[id number]]             GYN counsel breast self exam, mammography screening, adequate intake of calcium and vitamin D, diet and exercise     F/U  Return in about 1 year (around 01/26/2022) for lab appt this wk, annual.  Elmo Putt B. Kearston Putman, PA-C 01/26/2021 4:39 PM

## 2021-01-26 NOTE — Patient Instructions (Signed)
I value your feedback and you entrusting us with your care. If you get a [facility] patient survey, I would appreciate you taking the time to let us know about your experience today. Thank you!  [facility]: [phone]      

## 2021-01-27 ENCOUNTER — Other Ambulatory Visit: Payer: No Typology Code available for payment source

## 2021-01-27 DIAGNOSIS — Z1329 Encounter for screening for other suspected endocrine disorder: Secondary | ICD-10-CM

## 2021-01-27 DIAGNOSIS — N951 Menopausal and female climacteric states: Secondary | ICD-10-CM

## 2021-01-28 LAB — TSH+FREE T4
Free T4: 1.1 ng/dL (ref 0.82–1.77)
TSH: 1.01 u[IU]/mL (ref 0.450–4.500)

## 2021-01-30 LAB — CYTOLOGY - PAP
Comment: NEGATIVE
Diagnosis: NEGATIVE
High risk HPV: NEGATIVE

## 2021-02-01 ENCOUNTER — Other Ambulatory Visit: Payer: No Typology Code available for payment source

## 2021-02-01 DIAGNOSIS — Z20822 Contact with and (suspected) exposure to covid-19: Secondary | ICD-10-CM

## 2021-02-02 LAB — SARS-COV-2, NAA 2 DAY TAT

## 2021-02-02 LAB — NOVEL CORONAVIRUS, NAA: SARS-CoV-2, NAA: NOT DETECTED

## 2021-02-28 ENCOUNTER — Other Ambulatory Visit: Payer: Self-pay

## 2021-02-28 ENCOUNTER — Ambulatory Visit
Admission: RE | Admit: 2021-02-28 | Discharge: 2021-02-28 | Disposition: A | Discharge status: Home / Self Care | Payer: No Typology Code available for payment source | Source: Ambulatory Visit | Attending: Obstetrics and Gynecology | Admitting: Obstetrics and Gynecology

## 2021-02-28 DIAGNOSIS — Z1231 Encounter for screening mammogram for malignant neoplasm of breast: Secondary | ICD-10-CM | POA: Insufficient documentation

## 2021-04-10 MED FILL — Levonorgestrel & Ethinyl Estradiol Tab 0.1 MG-20 MCG: ORAL | 63 days supply | Qty: 84 | Fill #0 | Status: AC

## 2021-04-12 ENCOUNTER — Other Ambulatory Visit: Payer: Self-pay

## 2021-04-17 ENCOUNTER — Other Ambulatory Visit: Payer: Self-pay

## 2021-04-20 ENCOUNTER — Other Ambulatory Visit: Payer: Self-pay

## 2021-04-26 ENCOUNTER — Other Ambulatory Visit: Payer: Self-pay

## 2021-04-26 MED FILL — Levocetirizine Dihydrochloride Tab 5 MG: ORAL | 90 days supply | Qty: 90 | Fill #0 | Status: AC

## 2021-04-26 MED FILL — Montelukast Sodium Tab 10 MG (Base Equiv): ORAL | 90 days supply | Qty: 90 | Fill #0 | Status: AC

## 2021-06-06 ENCOUNTER — Other Ambulatory Visit: Payer: Self-pay

## 2021-06-06 MED FILL — Pantoprazole Sodium EC Tab 40 MG (Base Equiv): ORAL | 90 days supply | Qty: 90 | Fill #0 | Status: AC

## 2021-06-27 MED FILL — Levonorgestrel & Ethinyl Estradiol Tab 0.1 MG-20 MCG: ORAL | 84 days supply | Qty: 84 | Fill #1 | Status: CN

## 2021-06-28 ENCOUNTER — Other Ambulatory Visit: Payer: Self-pay

## 2021-06-28 MED FILL — Levonorgestrel & Ethinyl Estradiol Tab 0.1 MG-20 MCG: ORAL | 84 days supply | Qty: 84 | Fill #1 | Status: CN

## 2021-06-29 ENCOUNTER — Other Ambulatory Visit: Payer: Self-pay

## 2021-06-29 ENCOUNTER — Encounter: Payer: Self-pay | Admitting: Obstetrics and Gynecology

## 2021-06-30 ENCOUNTER — Other Ambulatory Visit: Payer: Self-pay | Admitting: Obstetrics and Gynecology

## 2021-06-30 ENCOUNTER — Other Ambulatory Visit: Payer: Self-pay

## 2021-06-30 DIAGNOSIS — Z3041 Encounter for surveillance of contraceptive pills: Secondary | ICD-10-CM

## 2021-06-30 MED ORDER — LEVONORGESTREL-ETHINYL ESTRAD 0.1-20 MG-MCG PO TABS
1.0000 | ORAL_TABLET | Freq: Every day | ORAL | 2 refills | Status: DC
  Filled 2021-06-30: qty 84, 84d supply, fill #0
  Filled 2021-07-03: qty 84, 63d supply, fill #0
  Filled 2021-09-07 – 2021-09-08 (×2): qty 84, 63d supply, fill #1
  Filled 2021-11-20: qty 84, 63d supply, fill #2

## 2021-07-03 ENCOUNTER — Other Ambulatory Visit: Payer: Self-pay

## 2021-07-13 ENCOUNTER — Encounter: Payer: No Typology Code available for payment source | Admitting: Internal Medicine

## 2021-07-14 ENCOUNTER — Encounter: Payer: Self-pay | Admitting: Internal Medicine

## 2021-07-14 ENCOUNTER — Encounter: Payer: No Typology Code available for payment source | Admitting: Internal Medicine

## 2021-07-14 ENCOUNTER — Ambulatory Visit (INDEPENDENT_AMBULATORY_CARE_PROVIDER_SITE_OTHER): Payer: No Typology Code available for payment source | Admitting: Internal Medicine

## 2021-07-14 ENCOUNTER — Other Ambulatory Visit: Payer: Self-pay

## 2021-07-14 VITALS — BP 123/60 | HR 79 | Temp 97.9°F | Resp 18 | Ht 64.0 in | Wt 258.8 lb

## 2021-07-14 DIAGNOSIS — R7303 Prediabetes: Secondary | ICD-10-CM

## 2021-07-14 DIAGNOSIS — E66812 Obesity, class 2: Secondary | ICD-10-CM | POA: Insufficient documentation

## 2021-07-14 DIAGNOSIS — K219 Gastro-esophageal reflux disease without esophagitis: Secondary | ICD-10-CM | POA: Diagnosis not present

## 2021-07-14 DIAGNOSIS — Z0001 Encounter for general adult medical examination with abnormal findings: Secondary | ICD-10-CM | POA: Diagnosis not present

## 2021-07-14 DIAGNOSIS — E6609 Other obesity due to excess calories: Secondary | ICD-10-CM | POA: Insufficient documentation

## 2021-07-14 DIAGNOSIS — Z1159 Encounter for screening for other viral diseases: Secondary | ICD-10-CM

## 2021-07-14 MED ORDER — PANTOPRAZOLE SODIUM 40 MG PO TBEC
DELAYED_RELEASE_TABLET | Freq: Every day | ORAL | 3 refills | Status: DC
  Filled 2021-07-14 – 2021-09-08 (×2): qty 90, 90d supply, fill #0
  Filled 2021-12-19: qty 90, 90d supply, fill #1
  Filled 2022-03-20: qty 90, 90d supply, fill #2
  Filled 2022-05-17 – 2022-05-23 (×2): qty 90, 90d supply, fill #3

## 2021-07-14 NOTE — Assessment & Plan Note (Signed)
Encouraged diet and exercise for weight loss ?

## 2021-07-14 NOTE — Assessment & Plan Note (Signed)
A1C today Encouraged low carb diet and exercise for weight loss 

## 2021-07-14 NOTE — Assessment & Plan Note (Signed)
Try to avoid foods that trigger your reflux Encouraged weight loss as this can help reduce reflux symptoms Pantoprazole refilled today CBC and CMET today

## 2021-07-14 NOTE — Progress Notes (Signed)
Subjective:    Patient ID: Alejandra Watkins, female    DOB: 1976/11/17, 45 y.o.   MRN: [id number]  HPI  Pt presents to the clinic today for her annual exam. She is also due to follow up chronic conditions.  GERD: Triggered by fried foods. She denies breakthrough on Pantoprazole. There is no upper GI on file.  Prediabetes: Her last A1C was 6%, 06/2020. She is not taking any oral diabetic medication at this time. She checks her feet routinely. Her last eye exam was.  HLD: Her last LDL was 122, triglycerides 59, 06/2020. She is not taking any cholesterol lowering medication at this time. She tries to consume a low fat diet.  Flu: 09/2019 Tetanus: > 10 years Covid: Pfizer x 3 Pneumovax: never Pap smear: 01/2021 Mammogram: 02/2021 Colon Screening: 08/2020 Vision Screening: annually Dentist: biannually  Diet: She does eat meat. She consumes fruits and veggies. She tries to avoid fried foods. She drinks mostly water, coffee, sugar free tea. Exercise: None  Review of Systems  Past Medical History:  Diagnosis Date   Acid reflux    Anemia    Diabetes mellitus without complication (HCC)    Family history of colon cancer    History of chicken pox    Leiomyoma    Ovarian cyst    seen at [facility]    Current Outpatient Medications  Medication Sig Dispense Refill   azelastine (OPTIVAR) 0.05 % ophthalmic solution      Biotin 10 MG CAPS Take by mouth.     Cholecalciferol (VITAMIN D3) 5000 units CAPS Take 1 capsule by mouth daily.     clotrimazole-betamethasone (LOTRISONE) cream Apply externally BID prn sx up to 2 wks 15 g 0   glucose blood (BAYER CONTOUR NEXT TEST) test strip 1 each by Other route 2 (two) times daily as needed for other. Use as instructed 200 each 2   ketoconazole (NIZORAL) 2 % cream      levocetirizine (XYZAL) 5 MG tablet      levocetirizine (XYZAL) 5 MG tablet TAKE 1 TABLET BY MOUTH DAILY IN THE EVENING FOR 90 DAYS 90 tablet 2   levonorgestrel-ethinyl estradiol (ALESSE)  0.1-20 MG-MCG tablet Take 1 tablet by mouth daily. CONTINUOUS DOSING 84 tablet 2   montelukast (SINGULAIR) 10 MG tablet      montelukast (SINGULAIR) 10 MG tablet TAKE 1 TABLET BY MOUTH DAILY IN THE EVENING 90 tablet 2   Multiple Vitamins-Minerals (MULTIVITAMIN WOMEN) TABS Take 1 tablet by mouth daily.     NON FORMULARY Ashwanganda     pantoprazole (PROTONIX) 40 MG tablet TAKE ONE TABLET BY MOUTH DAILY 90 tablet 3   vitamin B-12 (CYANOCOBALAMIN) 1000 MCG tablet Take 1,000 mcg by mouth daily.     No current facility-administered medications for this visit.    No Known Allergies  Family History  Problem Relation Age of Onset   Hypertension Mother    Alcoholism Father    Hypertension Paternal Grandmother    Diabetes Paternal Grandmother        type 2   Colon cancer Other    Diabetes Other        type 2   Hypertension Other    Diabetes Other        type 2   Stroke Other    Lung cancer Paternal Uncle    Heart disease Neg Hx    Breast cancer Neg Hx     Social History   Socioeconomic History   Marital status:  Married    Spouse name: Jori Moll   Number of children: 3   Years of education: Not on file   Highest education level: Not on file  Occupational History   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[employer]  Tobacco Use   Smoking status: Never   Smokeless tobacco: Never  Vaping Use   Vaping Use: Never used  Substance and Sexual Activity   Alcohol use: Yes    Alcohol/week: 1.0 standard drink    Types: 1 Glasses of wine per week    Comment: occasional   Drug use: No   Sexual activity: Yes    Birth control/protection: Surgical    Comment: Hysterectomy  Other Topics Concern   Not on file  Social History Narrative   Not on file   Social Determinants of Health   Financial Resource Strain: Not on file  Food Insecurity: Not on file  Transportation Needs: Not on file  Physical Activity: Not on file  Stress: Not on file  Social Connections: Not on file   Intimate Partner Violence: Not on file     Constitutional: Denies fever, malaise, fatigue, headache or abrupt weight changes.  HEENT: Denies eye pain, eye redness, ear pain, ringing in the ears, wax buildup, runny nose, nasal congestion, bloody nose, or sore throat. Respiratory: Denies difficulty breathing, shortness of breath, cough or sputum production.   Cardiovascular: Denies chest pain, chest tightness, palpitations or swelling in the hands or feet.  Gastrointestinal: Denies abdominal pain, bloating, constipation, diarrhea or blood in the stool.  GU: Denies urgency, frequency, pain with urination, burning sensation, blood in urine, odor or discharge. Musculoskeletal: Denies decrease in range of motion, difficulty with gait, muscle pain or joint pain and swelling.  Skin: Denies redness, rashes, lesions or ulcercations.  Neurological: Denies dizziness, difficulty with memory, difficulty with speech or problems with balance and coordination.  Psych: Denies anxiety, depression, SI/HI.  No other specific complaints in a complete review of systems (except as listed in HPI above).     Objective:   Physical Exam  BP 123/60 (BP Location: Right Arm, Patient Position: Sitting, Cuff Size: Large)   Pulse 79   Temp 97.9 F (36.6 C) (Temporal)   Resp 18   Ht '5\' 4"'$  (1.626 m)   Wt 258 lb 12.8 oz (117.4 kg)   LMP  (LMP Unknown)   SpO2 100%   BMI 44.42 kg/m   Wt Readings from Last 3 Encounters:  01/26/21 246 lb (111.6 kg)  09/09/20 224 lb (101.6 kg)  07/11/20 (!) 222 lb (100.7 kg)    General: Appears her stated age, obese, in NAD. Skin: Warm, dry and intact. No rashes noted. HEENT: Head: normal shape and size; Eyes: sclera white and EOMs intact;  Neck:  Neck supple, trachea midline. No masses, lumps or thyromegaly present.  Cardiovascular: Normal rate and rhythm. S1,S2 noted.  No murmur, rubs or gallops noted. No JVD or BLE edema.  Pulmonary/Chest: Normal effort and positive vesicular  breath sounds. No respiratory distress. No wheezes, rales or ronchi noted.  Abdomen: Soft and nontender. Normal bowel sounds. No distention or masses noted. Liver, spleen and kidneys non palpable. Musculoskeletal: Strength 5/5 BUE/BLE. No difficulty with gait.  Neurological: Alert and oriented. Cranial nerves II-XII grossly intact. Coordination normal.  Psychiatric: Mood and affect normal. Behavior is normal. Judgment and thought content normal.     BMET    Component Value Date/Time   NA 141 07/11/2020 0910   NA 141 01/07/2017 0926  NA 141 10/30/2014 1545   K 4.3 07/11/2020 0910   K 4.5 10/30/2014 1545   CL 108 07/11/2020 0910   CL 107 10/30/2014 1545   CO2 28 07/11/2020 0910   CO2 28 10/30/2014 1545   GLUCOSE 100 (H) 07/11/2020 0910   GLUCOSE 83 10/30/2014 1545   BUN 14 07/11/2020 0910   BUN 12 01/07/2017 0926   BUN 16 10/30/2014 1545   CREATININE 0.85 07/11/2020 0910   CREATININE 1.17 10/30/2014 1545   CALCIUM 9.0 07/11/2020 0910   CALCIUM 8.0 (L) 10/30/2014 1545   GFRNONAA 93 01/07/2017 0926   GFRNONAA 54 (L) 10/30/2014 1545   GFRNONAA >60 05/08/2012 1158   GFRAA 107 01/07/2017 0926   GFRAA >60 10/30/2014 1545   GFRAA >60 05/08/2012 1158    Lipid Panel     Component Value Date/Time   CHOL 182 07/11/2020 0910   CHOL 219 (H) 01/07/2017 0926   TRIG 59.0 07/11/2020 0910   HDL 47.90 07/11/2020 0910   HDL 53 01/07/2017 0926   CHOLHDL 4 07/11/2020 0910   VLDL 11.8 07/11/2020 0910   LDLCALC 122 (H) 07/11/2020 0910   LDLCALC 141 (H) 01/07/2017 0926    CBC    Component Value Date/Time   WBC 5.9 07/11/2020 0910   RBC 4.99 07/11/2020 0910   HGB 11.4 (L) 07/11/2020 0910   HGB 10.9 (L) 01/07/2017 1518   HCT 35.8 (L) 07/11/2020 0910   HCT 37.2 01/07/2017 1518   PLT 401.0 (H) 07/11/2020 0910   PLT 409 (H) 01/07/2017 1518   MCV 71.8 (L) 07/11/2020 0910   MCV 70 (L) 01/07/2017 1518   MCV 72 (L) 10/30/2014 1545   MCH 20.6 (L) 01/07/2017 1518   MCH 23.3 (L)  07/23/2016 2117   MCHC 31.8 07/11/2020 0910   RDW 16.1 (H) 07/11/2020 0910   RDW 17.5 (H) 01/07/2017 1518   RDW 15.5 (H) 10/30/2014 1545   LYMPHSABS 2.9 01/07/2017 1518   LYMPHSABS 2.6 10/30/2014 1545   MONOABS 1.1 (H) 07/23/2016 2117   MONOABS 0.8 10/30/2014 1545   EOSABS 0.2 01/07/2017 1518   EOSABS 0.1 10/30/2014 1545   BASOSABS 0.0 01/07/2017 1518   BASOSABS 0.1 10/30/2014 1545    Hgb A1C Lab Results  Component Value Date   HGBA1C 6.0 07/11/2020            Assessment & Plan:   Preventative Health Maintenance:  Encouraged her to get a flu shot in the fall She declines tetanus vaccine today Covid vaccine UTD She declines pneumovax today Pap smear UTD Mammogram UTD Colon screening UTD Encouraged her to consume a balanced diet and exercise regimen Advised her to see an eye doctor and dentist annually Will check CBC, CMET, Lipid, A1C and Hep C today  RTC in 1 year, sooner if needed Webb Silversmith, NP This visit occurred during the SARS-CoV-2 public health emergency.  Safety protocols were in place, including screening questions prior to the visit, additional usage of staff PPE, and extensive cleaning of exam room while observing appropriate contact time as indicated for disinfecting solutions.

## 2021-07-14 NOTE — Patient Instructions (Signed)
Health Maintenance, Female Adopting a healthy lifestyle and getting preventive care are important in promoting health and wellness. Ask your health care provider about: The right schedule for you to have regular tests and exams. Things you can do on your own to prevent diseases and keep yourself healthy. What should I know about diet, weight, and exercise? Eat a healthy diet  Eat a diet that includes plenty of vegetables, fruits, low-fat dairy products, and lean protein. Do not eat a lot of foods that are high in solid fats, added sugars, or sodium.  Maintain a healthy weight Body mass index (BMI) is used to identify weight problems. It estimates body fat based on height and weight. Your health care provider can help determineyour BMI and help you achieve or maintain a healthy weight. Get regular exercise Get regular exercise. This is one of the most important things you can do for your health. Most adults should: Exercise for at least 150 minutes each week. The exercise should increase your heart rate and make you sweat (moderate-intensity exercise). Do strengthening exercises at least twice a week. This is in addition to the moderate-intensity exercise. Spend less time sitting. Even light physical activity can be beneficial. Watch cholesterol and blood lipids Have your blood tested for lipids and cholesterol at 47 years of age, then havethis test every 5 years. Have your cholesterol levels checked more often if: Your lipid or cholesterol levels are high. You are older than 47 years of age. You are at high risk for heart disease. What should I know about cancer screening? Depending on your health history and family history, you may need to have cancer screening at various ages. This may include screening for: Breast cancer. Cervical cancer. Colorectal cancer. Skin cancer. Lung cancer. What should I know about heart disease, diabetes, and high blood pressure? Blood pressure and heart  disease High blood pressure causes heart disease and increases the risk of stroke. This is more likely to develop in people who have high blood pressure readings, are of African descent, or are overweight. Have your blood pressure checked: Every 3-5 years if you are 18-39 years of age. Every year if you are 40 years old or older. Diabetes Have regular diabetes screenings. This checks your fasting blood sugar level. Have the screening done: Once every three years after age 40 if you are at a normal weight and have a low risk for diabetes. More often and at a younger age if you are overweight or have a high risk for diabetes. What should I know about preventing infection? Hepatitis B If you have a higher risk for hepatitis B, you should be screened for this virus. Talk with your health care provider to find out if you are at risk forhepatitis B infection. Hepatitis C Testing is recommended for: Everyone born from 1945 through 1965. Anyone with known risk factors for hepatitis C. Sexually transmitted infections (STIs) Get screened for STIs, including gonorrhea and chlamydia, if: You are sexually active and are younger than 47 years of age. You are older than 47 years of age and your health care provider tells you that you are at risk for this type of infection. Your sexual activity has changed since you were last screened, and you are at increased risk for chlamydia or gonorrhea. Ask your health care provider if you are at risk. Ask your health care provider about whether you are at high risk for HIV. Your health care provider may recommend a prescription medicine to help   prevent HIV infection. If you choose to take medicine to prevent HIV, you should first get tested for HIV. You should then be tested every 3 months for as long as you are taking the medicine. Pregnancy If you are about to stop having your period (premenopausal) and you may become pregnant, seek counseling before you get  pregnant. Take 400 to 800 micrograms (mcg) of folic acid every day if you become pregnant. Ask for birth control (contraception) if you want to prevent pregnancy. Osteoporosis and menopause Osteoporosis is a disease in which the bones lose minerals and strength with aging. This can result in bone fractures. If you are 65 years old or older, or if you are at risk for osteoporosis and fractures, ask your health care provider if you should: Be screened for bone loss. Take a calcium or vitamin D supplement to lower your risk of fractures. Be given hormone replacement therapy (HRT) to treat symptoms of menopause. Follow these instructions at home: Lifestyle Do not use any products that contain nicotine or tobacco, such as cigarettes, e-cigarettes, and chewing tobacco. If you need help quitting, ask your health care provider. Do not use street drugs. Do not share needles. Ask your health care provider for help if you need support or information about quitting drugs. Alcohol use Do not drink alcohol if: Your health care provider tells you not to drink. You are pregnant, may be pregnant, or are planning to become pregnant. If you drink alcohol: Limit how much you use to 0-1 drink a day. Limit intake if you are breastfeeding. Be aware of how much alcohol is in your drink. In the [place] one drink equals one 12 oz bottle of beer (355 mL), one 5 oz glass of wine (148 mL), or one 1 oz glass of hard liquor (44 mL). General instructions Schedule regular health, dental, and eye exams. Stay current with your vaccines. Tell your health care provider if: You often feel depressed. You have ever been abused or do not feel safe at home. Summary Adopting a healthy lifestyle and getting preventive care are important in promoting health and wellness. Follow your health care provider's instructions about healthy diet, exercising, and getting tested or screened for diseases. Follow your health care provider's  instructions on monitoring your cholesterol and blood pressure. This information is not intended to replace advice given to you by your health care provider. Make sure you discuss any questions you have with your healthcare provider. Document Revised: 11/26/2018 Document Reviewed: 11/26/2018 Elsevier Patient Education  2022 Elsevier Inc.  

## 2021-07-17 LAB — COMPLETE METABOLIC PANEL WITH GFR
AG Ratio: 1.7 (calc) (ref 1.0–2.5)
ALT: 35 U/L — ABNORMAL HIGH (ref 6–29)
AST: 32 U/L (ref 10–35)
Albumin: 4 g/dL (ref 3.6–5.1)
Alkaline phosphatase (APISO): 73 U/L (ref 31–125)
BUN: 15 mg/dL (ref 7–25)
CO2: 24 mmol/L (ref 20–32)
Calcium: 9.2 mg/dL (ref 8.6–10.2)
Chloride: 104 mmol/L (ref 98–110)
Creat: 0.79 mg/dL (ref 0.50–0.99)
Globulin: 2.3 g/dL (calc) (ref 1.9–3.7)
Glucose, Bld: 83 mg/dL (ref 65–139)
Potassium: 4.3 mmol/L (ref 3.5–5.3)
Sodium: 137 mmol/L (ref 135–146)
Total Bilirubin: 0.3 mg/dL (ref 0.2–1.2)
Total Protein: 6.3 g/dL (ref 6.1–8.1)
eGFR: 93 mL/min/{1.73_m2} (ref >=60)

## 2021-07-17 LAB — LIPID PANEL
Cholesterol: 187 mg/dL (ref <200)
HDL: 44 mg/dL — ABNORMAL LOW (ref >=50)
LDL Cholesterol (Calc): 124 mg/dL (calc) — ABNORMAL HIGH
Non-HDL Cholesterol (Calc): 143 mg/dL (calc) — ABNORMAL HIGH (ref <130)
Total CHOL/HDL Ratio: 4.3 (calc) (ref <5.0)
Triglycerides: 88 mg/dL (ref <150)

## 2021-07-17 LAB — CBC
HCT: 37.4 % (ref 35.0–45.0)
Hemoglobin: 11.2 g/dL — ABNORMAL LOW (ref 11.7–15.5)
MCH: 21.7 pg — ABNORMAL LOW (ref 27.0–33.0)
MCHC: 29.9 g/dL — ABNORMAL LOW (ref 32.0–36.0)
MCV: 72.6 fL — ABNORMAL LOW (ref 80.0–100.0)
MPV: 9.7 fL (ref 7.5–12.5)
Platelets: 389 10*3/uL (ref 140–400)
RBC: 5.15 10*6/uL — ABNORMAL HIGH (ref 3.80–5.10)
RDW: 16 % — ABNORMAL HIGH (ref 11.0–15.0)
WBC: 8.3 10*3/uL (ref 3.8–10.8)

## 2021-07-17 LAB — HEPATITIS C ANTIBODY
Hepatitis C Ab: NONREACTIVE
SIGNAL TO CUT-OFF: 0.01 (ref <1.00)

## 2021-07-17 LAB — HEMOGLOBIN A1C
Hgb A1c MFr Bld: 6.5 % of total Hgb — ABNORMAL HIGH (ref <5.7)
Mean Plasma Glucose: 140 mg/dL
eAG (mmol/L): 7.7 mmol/L

## 2021-07-19 ENCOUNTER — Ambulatory Visit (INDEPENDENT_AMBULATORY_CARE_PROVIDER_SITE_OTHER): Payer: No Typology Code available for payment source | Admitting: Internal Medicine

## 2021-07-19 ENCOUNTER — Encounter: Payer: Self-pay | Admitting: Internal Medicine

## 2021-07-19 ENCOUNTER — Other Ambulatory Visit: Payer: Self-pay

## 2021-07-19 DIAGNOSIS — E7800 Pure hypercholesterolemia, unspecified: Secondary | ICD-10-CM | POA: Diagnosis not present

## 2021-07-19 DIAGNOSIS — E119 Type 2 diabetes mellitus without complications: Secondary | ICD-10-CM

## 2021-07-19 DIAGNOSIS — E785 Hyperlipidemia, unspecified: Secondary | ICD-10-CM | POA: Insufficient documentation

## 2021-07-19 MED ORDER — OZEMPIC (0.25 OR 0.5 MG/DOSE) 2 MG/1.5ML ~~LOC~~ SOPN
PEN_INJECTOR | SUBCUTANEOUS | 3 refills | Status: DC
  Filled 2021-07-19: qty 1.5, 56d supply, fill #0
  Filled 2021-09-27: qty 4.5, 84d supply, fill #1

## 2021-07-19 MED ORDER — PEN NEEDLES 31G X 5 MM MISC
1.0000 | 0 refills | Status: DC
  Filled 2021-07-19: qty 30, fill #0

## 2021-07-19 NOTE — Assessment & Plan Note (Signed)
New onset Discussed diabetes and standards of medical care Encourage low-carb diet and exercise for weight loss Will start Ozempic 0.25 mg weekly x 4 weeks, then increase to 0.5 mg weekly x 8 weeks, sample provided today and Rx sent to pharmacy Encourage routine eye exams Encourage routine foot exams We will discuss immunizations at follow-up appointment

## 2021-07-19 NOTE — Progress Notes (Signed)
Subjective:    Patient ID: Alejandra Watkins, female    DOB: [birth year], 45 y.o.   MRN: [id number]  HPI  Patient presents to clinic today for follow-up labs.  She had a recent A1c of 6.5%.  Her recent LDL was 124, triglycerides 88.  She has a history of prediabetes. She has a family history of diabetes.  She is already cut out carbs and started doing cardio at home.  Review of Systems  Past Medical History:  Diagnosis Date   Acid reflux    Anemia    Diabetes mellitus without complication ([place])    Family history of colon cancer    History of chicken pox    Leiomyoma    Ovarian cyst    seen at [facility]    Current Outpatient Medications  Medication Sig Dispense Refill   azelastine (OPTIVAR) 0.05 % ophthalmic solution      Biotin 10 MG CAPS Take by mouth.     Cholecalciferol (VITAMIN D3) 5000 units CAPS Take 1 capsule by mouth daily.     clotrimazole-betamethasone (LOTRISONE) cream Apply externally BID prn sx up to 2 wks 15 g 0   glucose blood (BAYER CONTOUR NEXT TEST) test strip 1 each by Other route 2 (two) times daily as needed for other. Use as instructed 200 each 2   ketoconazole (NIZORAL) 2 % cream      levocetirizine (XYZAL) 5 MG tablet TAKE 1 TABLET BY MOUTH DAILY IN THE EVENING FOR 90 DAYS 90 tablet 2   levonorgestrel-ethinyl estradiol (ALESSE) 0.1-20 MG-MCG tablet Take 1 tablet by mouth daily. CONTINUOUS DOSING 84 tablet 2   montelukast (SINGULAIR) 10 MG tablet TAKE 1 TABLET BY MOUTH DAILY IN THE EVENING 90 tablet 2   Multiple Vitamins-Minerals (MULTIVITAMIN WOMEN) TABS Take 1 tablet by mouth daily.     NON FORMULARY Ashwanganda     pantoprazole (PROTONIX) 40 MG tablet TAKE ONE TABLET BY MOUTH DAILY 90 tablet 3   vitamin B-12 (CYANOCOBALAMIN) 1000 MCG tablet Take 1,000 mcg by mouth daily.     No current facility-administered medications for this visit.    No Known Allergies  Family History  Problem Relation Age of Onset   Hypertension Mother    Alcoholism Father     Hypertension Paternal Grandmother    Diabetes Paternal Grandmother        type 2   Colon cancer Other    Diabetes Other        type 2   Hypertension Other    Diabetes Other        type 2   Stroke Other    Lung cancer Paternal Uncle    Heart disease Neg Hx    Breast cancer Neg Hx     Social History   Socioeconomic History   Marital status: Married    Spouse name: Jori Moll   Number of children: 3   Years of education: Not on file   Highest education level: Not on file  Occupational History   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[employer]  Tobacco Use   Smoking status: Never   Smokeless tobacco: Never  Vaping Use   Vaping Use: Never used  Substance and Sexual Activity   Alcohol use: Yes    Alcohol/week: 1.0 standard drink    Types: 1 Glasses of wine per week    Comment: occasional   Drug use: No   Sexual activity: Yes    Birth control/protection: Surgical    Comment:  Hysterectomy  Other Topics Concern   Not on file  Social History Narrative   Not on file   Social Determinants of Health   Financial Resource Strain: Not on file  Food Insecurity: Not on file  Transportation Needs: Not on file  Physical Activity: Not on file  Stress: Not on file  Social Connections: Not on file  Intimate Partner Violence: Not on file     Constitutional: Denies fever, malaise, fatigue, headache or abrupt weight changes.  HEENT: Denies eye pain, eye redness, ear pain, ringing in the ears, wax buildup, runny nose, nasal congestion, bloody nose, or sore throat. Respiratory: Denies difficulty breathing, shortness of breath, cough or sputum production.   Cardiovascular: Denies chest pain, chest tightness, palpitations or swelling in the hands or feet.  GU: Denies urgency, frequency, pain with urination, burning sensation, blood in urine, odor or discharge. Skin: Denies redness, rashes, lesions or ulcercations.  Neurological: Denies dizziness, difficulty with memory,  difficulty with speech or problems with balance and coordination.   No other specific complaints in a complete review of systems (except as listed in HPI above).     Objective:   Physical Exam  BP (!) 122/59 (BP Location: Right Arm, Patient Position: Sitting, Cuff Size: Large)   Pulse 78   Temp 98.4 F (36.9 C) (Temporal)   Resp 18   Ht '5\' 4"'$  (1.626 m)   Wt 255 lb (115.7 kg)   LMP  (LMP Unknown)   SpO2 100%   BMI 43.77 kg/m   Wt Readings from Last 3 Encounters:  07/14/21 258 lb 12.8 oz (117.4 kg)  01/26/21 246 lb (111.6 kg)  09/09/20 224 lb (101.6 kg)    General: Appears her stated age, obese, in NAD. Skin: Warm, dry and intact. No ulcerations noted. Cardiovascular: Normal rate. Pulmonary/Chest: Normal effort. Neurological: Alert and oriented.   BMET    Component Value Date/Time   NA 137 07/14/2021 1428   NA 141 01/07/2017 0926   NA 141 10/30/2014 1545   K 4.3 07/14/2021 1428   K 4.5 10/30/2014 1545   CL 104 07/14/2021 1428   CL 107 10/30/2014 1545   CO2 24 07/14/2021 1428   CO2 28 10/30/2014 1545   GLUCOSE 83 07/14/2021 1428   GLUCOSE 83 10/30/2014 1545   BUN 15 07/14/2021 1428   BUN 12 01/07/2017 0926   BUN 16 10/30/2014 1545   CREATININE 0.79 07/14/2021 1428   CALCIUM 9.2 07/14/2021 1428   CALCIUM 8.0 (L) 10/30/2014 1545   GFRNONAA 93 01/07/2017 0926   GFRNONAA 54 (L) 10/30/2014 1545   GFRNONAA >60 05/08/2012 1158   GFRAA 107 01/07/2017 0926   GFRAA >60 10/30/2014 1545   GFRAA >60 05/08/2012 1158    Lipid Panel     Component Value Date/Time   CHOL 187 07/14/2021 1428   CHOL 219 (H) 01/07/2017 0926   TRIG 88 07/14/2021 1428   HDL 44 (L) 07/14/2021 1428   HDL 53 01/07/2017 0926   CHOLHDL 4.3 07/14/2021 1428   VLDL 11.8 07/11/2020 0910   LDLCALC 124 (H) 07/14/2021 1428    CBC    Component Value Date/Time   WBC 8.3 07/14/2021 1428   RBC 5.15 (H) 07/14/2021 1428   HGB 11.2 (L) 07/14/2021 1428   HGB 10.9 (L) 01/07/2017 1518   HCT 37.4  07/14/2021 1428   HCT 37.2 01/07/2017 1518   PLT 389 07/14/2021 1428   PLT 409 (H) 01/07/2017 1518   MCV 72.6 (L) 07/14/2021 1428  MCV 70 (L) 01/07/2017 1518   MCV 72 (L) 10/30/2014 1545   MCH 21.7 (L) 07/14/2021 1428   MCHC 29.9 (L) 07/14/2021 1428   RDW 16.0 (H) 07/14/2021 1428   RDW 17.5 (H) 01/07/2017 1518   RDW 15.5 (H) 10/30/2014 1545   LYMPHSABS 2.9 01/07/2017 1518   LYMPHSABS 2.6 10/30/2014 1545   MONOABS 1.1 (H) 07/23/2016 2117   MONOABS 0.8 10/30/2014 1545   EOSABS 0.2 01/07/2017 1518   EOSABS 0.1 10/30/2014 1545   BASOSABS 0.0 01/07/2017 1518   BASOSABS 0.1 10/30/2014 1545    Hgb A1C Lab Results  Component Value Date   HGBA1C 6.5 (H) 07/14/2021           Assessment & Plan:   Webb Silversmith, NP This visit occurred during the SARS-CoV-2 public health emergency.  Safety protocols were in place, including screening questions prior to the visit, additional usage of staff PPE, and extensive cleaning of exam room while observing appropriate contact time as indicated for disinfecting solutions.

## 2021-07-19 NOTE — Assessment & Plan Note (Signed)
Encourage diet and exercise for weight loss 

## 2021-07-19 NOTE — Patient Instructions (Signed)

## 2021-07-19 NOTE — Assessment & Plan Note (Signed)
We will try 3 months of lifestyle changes Encouraged low-fat diet and exercise weight loss We will recheck lipid in 3 months at follow-up appointment

## 2021-07-25 ENCOUNTER — Other Ambulatory Visit: Payer: Self-pay

## 2021-07-26 ENCOUNTER — Other Ambulatory Visit: Payer: Self-pay

## 2021-07-26 MED ORDER — LEVOCETIRIZINE DIHYDROCHLORIDE 5 MG PO TABS
ORAL_TABLET | ORAL | 0 refills | Status: DC
  Filled 2021-07-26: qty 90, 90d supply, fill #0

## 2021-07-26 MED ORDER — MONTELUKAST SODIUM 10 MG PO TABS
ORAL_TABLET | ORAL | 0 refills | Status: DC
  Filled 2021-07-26: qty 90, 90d supply, fill #0

## 2021-07-27 ENCOUNTER — Encounter: Payer: Self-pay | Admitting: Internal Medicine

## 2021-09-05 ENCOUNTER — Other Ambulatory Visit: Payer: Self-pay

## 2021-09-05 MED ORDER — LEVOCETIRIZINE DIHYDROCHLORIDE 5 MG PO TABS
ORAL_TABLET | ORAL | 2 refills | Status: DC
  Filled 2021-09-05 – 2021-11-03 (×3): qty 90, 90d supply, fill #0
  Filled 2022-02-02: qty 90, 90d supply, fill #1
  Filled 2022-05-17: qty 90, 90d supply, fill #2

## 2021-09-05 MED ORDER — MONTELUKAST SODIUM 10 MG PO TABS
ORAL_TABLET | ORAL | 2 refills | Status: DC
  Filled 2021-09-05 – 2021-11-03 (×3): qty 90, 90d supply, fill #0
  Filled 2022-02-02: qty 90, 90d supply, fill #1
  Filled 2022-05-17: qty 90, 90d supply, fill #2

## 2021-09-08 ENCOUNTER — Other Ambulatory Visit: Payer: Self-pay

## 2021-09-27 ENCOUNTER — Other Ambulatory Visit: Payer: Self-pay

## 2021-10-25 ENCOUNTER — Ambulatory Visit: Payer: No Typology Code available for payment source | Admitting: Internal Medicine

## 2021-10-30 ENCOUNTER — Ambulatory Visit: Payer: No Typology Code available for payment source | Admitting: Internal Medicine

## 2021-11-03 ENCOUNTER — Other Ambulatory Visit: Payer: Self-pay

## 2021-11-03 ENCOUNTER — Encounter: Payer: Self-pay | Admitting: Internal Medicine

## 2021-11-03 ENCOUNTER — Ambulatory Visit (INDEPENDENT_AMBULATORY_CARE_PROVIDER_SITE_OTHER): Payer: No Typology Code available for payment source | Admitting: Internal Medicine

## 2021-11-03 VITALS — BP 120/79 | HR 77 | Temp 98.2°F | Resp 17 | Ht 64.0 in | Wt 237.2 lb

## 2021-11-03 DIAGNOSIS — E119 Type 2 diabetes mellitus without complications: Secondary | ICD-10-CM | POA: Diagnosis not present

## 2021-11-03 DIAGNOSIS — E7800 Pure hypercholesterolemia, unspecified: Secondary | ICD-10-CM | POA: Diagnosis not present

## 2021-11-03 DIAGNOSIS — D509 Iron deficiency anemia, unspecified: Secondary | ICD-10-CM | POA: Insufficient documentation

## 2021-11-03 LAB — HM DIABETES EYE EXAM

## 2021-11-03 MED ORDER — UPNEEQ 0.1 % OP SOLN: OPHTHALMIC | 1 refills | Status: DC

## 2021-11-03 NOTE — Assessment & Plan Note (Signed)
A1C today We will check urine microalbumin at next visit Encouraged her to consume a low-carb diet and exercise for weight loss Continue Ozempic, will increase to 1 mg pending A1c Encourage routine eye exam Encourage routine foot exam She declines flu and pneumonia vaccine Encouraged her to get her COVID booster

## 2021-11-03 NOTE — Patient Instructions (Signed)

## 2021-11-03 NOTE — Assessment & Plan Note (Signed)
Congratulated her on 18 pound weight loss

## 2021-11-03 NOTE — Progress Notes (Signed)
Subjective:    Patient ID: Alejandra Watkins, female    DOB: [birth year], 45 y.o.   MRN: [id number]  HPI  Patient presents the clinic today for 81-month follow-up of chronic conditions.  DM2: New onset, her last A1c was 6.5%, 06/2021.  She is taking Ozempic as prescribed.  Her sugars range 90-100. She has lost 18 lbs in the last 3 months. She checks her feet routinely.  Her last eye exam was.  Flu 09/2019.  Pneumovax never.  Arboriculturist.  HLD: Her last LDL was 124, triglycerides 88, 06/2021.  She is not taking any cholesterol-lowering medication at this time she has been trying to consume a low-fat diet.  Review of Systems  Past Medical History:  Diagnosis Date   Acid reflux    Anemia    Diabetes mellitus without complication ([place])    Family history of colon cancer    History of chicken pox    Leiomyoma    Ovarian cyst    seen at [facility]    Current Outpatient Medications  Medication Sig Dispense Refill   azelastine (OPTIVAR) 0.05 % ophthalmic solution      Biotin 10 MG CAPS Take by mouth.     Cholecalciferol (VITAMIN D3) 5000 units CAPS Take 1 capsule by mouth daily.     clotrimazole-betamethasone (LOTRISONE) cream Apply externally BID prn sx up to 2 wks 15 g 0   glucose blood (BAYER CONTOUR NEXT TEST) test strip 1 each by Other route 2 (two) times daily as needed for other. Use as instructed 200 each 2   Insulin Pen Needle (PEN NEEDLES) 31G X 5 MM MISC 1 Device by Does not apply route once a week. 30 each 0   ketoconazole (NIZORAL) 2 % cream      levocetirizine (XYZAL) 5 MG tablet TAKE 1 TABLET BY MOUTH DAILY IN THE EVENING FOR 90 DAYS 90 tablet 2   levocetirizine (XYZAL) 5 MG tablet 1 tablet in the evening Orally Once a day 90 days 90 tablet 2   levonorgestrel-ethinyl estradiol (ALESSE) 0.1-20 MG-MCG tablet Take 1 tablet by mouth daily. CONTINUOUS DOSING 84 tablet 2   montelukast (SINGULAIR) 10 MG tablet TAKE 1 TABLET BY MOUTH DAILY IN THE EVENING 90 tablet 2   montelukast  (SINGULAIR) 10 MG tablet 1 tablet in the evening Orally Once a day 90 days 90 tablet 2   Multiple Vitamins-Minerals (MULTIVITAMIN WOMEN) TABS Take 1 tablet by mouth daily.     NON FORMULARY Ashwanganda     OZEMPIC, 0.25 OR 0.5 MG/DOSE, 2 MG/1.5ML SOPN Inject 0.25 mg into the skin once a week for 30 days, THEN 0.5 mg once a week. For first 4 weeks. Then increase dose to 0.5mg  weekly. 1.5 mL 3   pantoprazole (PROTONIX) 40 MG tablet TAKE ONE TABLET BY MOUTH DAILY 90 tablet 3   vitamin B-12 (CYANOCOBALAMIN) 1000 MCG tablet Take 1,000 mcg by mouth daily.     No current facility-administered medications for this visit.    No Known Allergies  Family History  Problem Relation Age of Onset   Hypertension Mother    Alcoholism Father    Hypertension Paternal Grandmother    Diabetes Paternal Grandmother        type 2   Colon cancer Other    Diabetes Other        type 2   Hypertension Other    Diabetes Other        type 2   Stroke Other  Lung cancer Paternal Uncle    Heart disease Neg Hx    Breast cancer Neg Hx     Social History   Socioeconomic History   Marital status: Married    Spouse name: Jori Moll   Number of children: 3   Years of education: Not on file   Highest education level: Not on file  Occupational History   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[employer]  Tobacco Use   Smoking status: Never   Smokeless tobacco: Never  Vaping Use   Vaping Use: Never used  Substance and Sexual Activity   Alcohol use: Yes    Alcohol/week: 1.0 standard drink    Types: 1 Glasses of wine per week    Comment: occasional   Drug use: No   Sexual activity: Yes    Birth control/protection: Surgical    Comment: Hysterectomy  Other Topics Concern   Not on file  Social History Narrative   Not on file   Social Determinants of Health   Financial Resource Strain: Not on file  Food Insecurity: Not on file  Transportation Needs: Not on file  Physical Activity: Not on file   Stress: Not on file  Social Connections: Not on file  Intimate Partner Violence: Not on file     Constitutional: Denies fever, malaise, fatigue, headache or abrupt weight changes.  Respiratory: Denies difficulty breathing, shortness of breath, cough or sputum production.   Cardiovascular: Denies chest pain, chest tightness, palpitations or swelling in the hands or feet.  Gastrointestinal: Denies abdominal pain, bloating, constipation, diarrhea or blood in the stool.  GU: Denies urgency, frequency, pain with urination, burning sensation, blood in urine, odor or discharge. Skin: Denies redness, rashes, lesions or ulcercations.  Neurological: Denies dizziness, difficulty with memory, difficulty with speech or problems with balance and coordination.   No other specific complaints in a complete review of systems (except as listed in HPI above).     Objective:   Physical Exam  BP 120/79 (BP Location: Left Arm, Patient Position: Sitting, Cuff Size: Large)   Pulse 77   Temp 98.2 F (36.8 C) (Temporal)   Resp 17   Ht 5\' 4"  (1.626 m)   Wt 237 lb 3.2 oz (107.6 kg)   LMP  (LMP Unknown)   SpO2 100%   BMI 40.72 kg/m   Wt Readings from Last 3 Encounters:  07/19/21 255 lb (115.7 kg)  07/14/21 258 lb 12.8 oz (117.4 kg)  01/26/21 246 lb (111.6 kg)    General: Appears her stated age, obese, in NAD. Skin: Warm, dry and intact. No ulcerations noted. HEENT: Head: normal shape and size; Eyes: EOMs intact;  Cardiovascular: Normal rate and rhythm. S1,S2 noted.  No murmur, rubs or gallops noted.  Pulmonary/Chest: Normal effort and positive vesicular breath sounds. No respiratory distress. No wheezes, rales or ronchi noted.  Neurological: Alert and oriented.   BMET    Component Value Date/Time   NA 137 07/14/2021 1428   NA 141 01/07/2017 0926   NA 141 10/30/2014 1545   K 4.3 07/14/2021 1428   K 4.5 10/30/2014 1545   CL 104 07/14/2021 1428   CL 107 10/30/2014 1545   CO2 24 07/14/2021  1428   CO2 28 10/30/2014 1545   GLUCOSE 83 07/14/2021 1428   GLUCOSE 83 10/30/2014 1545   BUN 15 07/14/2021 1428   BUN 12 01/07/2017 0926   BUN 16 10/30/2014 1545   CREATININE 0.79 07/14/2021 1428   CALCIUM 9.2 07/14/2021 1428  CALCIUM 8.0 (L) 10/30/2014 1545   GFRNONAA 93 01/07/2017 0926   GFRNONAA 54 (L) 10/30/2014 1545   GFRNONAA >60 05/08/2012 1158   GFRAA 107 01/07/2017 0926   GFRAA >60 10/30/2014 1545   GFRAA >60 05/08/2012 1158    Lipid Panel     Component Value Date/Time   CHOL 187 07/14/2021 1428   CHOL 219 (H) 01/07/2017 0926   TRIG 88 07/14/2021 1428   HDL 44 (L) 07/14/2021 1428   HDL 53 01/07/2017 0926   CHOLHDL 4.3 07/14/2021 1428   VLDL 11.8 07/11/2020 0910   LDLCALC 124 (H) 07/14/2021 1428    CBC    Component Value Date/Time   WBC 8.3 07/14/2021 1428   RBC 5.15 (H) 07/14/2021 1428   HGB 11.2 (L) 07/14/2021 1428   HGB 10.9 (L) 01/07/2017 1518   HCT 37.4 07/14/2021 1428   HCT 37.2 01/07/2017 1518   PLT 389 07/14/2021 1428   PLT 409 (H) 01/07/2017 1518   MCV 72.6 (L) 07/14/2021 1428   MCV 70 (L) 01/07/2017 1518   MCV 72 (L) 10/30/2014 1545   MCH 21.7 (L) 07/14/2021 1428   MCHC 29.9 (L) 07/14/2021 1428   RDW 16.0 (H) 07/14/2021 1428   RDW 17.5 (H) 01/07/2017 1518   RDW 15.5 (H) 10/30/2014 1545   LYMPHSABS 2.9 01/07/2017 1518   LYMPHSABS 2.6 10/30/2014 1545   MONOABS 1.1 (H) 07/23/2016 2117   MONOABS 0.8 10/30/2014 1545   EOSABS 0.2 01/07/2017 1518   EOSABS 0.1 10/30/2014 1545   BASOSABS 0.0 01/07/2017 1518   BASOSABS 0.1 10/30/2014 1545    Hgb A1C Lab Results  Component Value Date   HGBA1C 6.5 (H) 07/14/2021           Assessment & Plan:     Webb Silversmith, NP This visit occurred during the SARS-CoV-2 public health emergency.  Safety protocols were in place, including screening questions prior to the visit, additional usage of staff PPE, and extensive cleaning of exam room while observing appropriate contact time as indicated for  disinfecting solutions.

## 2021-11-03 NOTE — Assessment & Plan Note (Signed)
Lipid profile today Encouraged her to consume a low-fat diet

## 2021-11-04 LAB — HEMOGLOBIN A1C
Hgb A1c MFr Bld: 5.9 % of total Hgb — ABNORMAL HIGH (ref <5.7)
Mean Plasma Glucose: 123 mg/dL
eAG (mmol/L): 6.8 mmol/L

## 2021-11-04 LAB — LIPID PANEL
Cholesterol: 184 mg/dL (ref <200)
HDL: 42 mg/dL — ABNORMAL LOW (ref >=50)
LDL Cholesterol (Calc): 124 mg/dL (calc) — ABNORMAL HIGH
Non-HDL Cholesterol (Calc): 142 mg/dL (calc) — ABNORMAL HIGH (ref <130)
Total CHOL/HDL Ratio: 4.4 (calc) (ref <5.0)
Triglycerides: 81 mg/dL (ref <150)

## 2021-11-07 ENCOUNTER — Ambulatory Visit
Admission: EM | Admit: 2021-11-07 | Discharge: 2021-11-07 | Disposition: A | Discharge status: Home / Self Care | Payer: No Typology Code available for payment source | Attending: Family Medicine | Admitting: Family Medicine

## 2021-11-07 ENCOUNTER — Telehealth: Payer: No Typology Code available for payment source | Admitting: Family Medicine

## 2021-11-07 ENCOUNTER — Other Ambulatory Visit: Payer: Self-pay

## 2021-11-07 ENCOUNTER — Encounter: Payer: Self-pay | Admitting: Emergency Medicine

## 2021-11-07 DIAGNOSIS — J111 Influenza due to unidentified influenza virus with other respiratory manifestations: Secondary | ICD-10-CM

## 2021-11-07 DIAGNOSIS — R6889 Other general symptoms and signs: Secondary | ICD-10-CM | POA: Diagnosis not present

## 2021-11-07 DIAGNOSIS — Z1152 Encounter for screening for COVID-19: Secondary | ICD-10-CM

## 2021-11-07 MED ORDER — ONDANSETRON 4 MG PO TBDP
4.0000 mg | ORAL_TABLET | Freq: Three times a day (TID) | ORAL | 0 refills | Status: DC | PRN
Start: 2021-11-07 — End: 2022-02-06
  Filled 2021-11-07: qty 20, 7d supply, fill #0

## 2021-11-07 MED ORDER — BENZONATATE 100 MG PO CAPS
200.0000 mg | ORAL_CAPSULE | Freq: Three times a day (TID) | ORAL | 0 refills | Status: DC | PRN
  Filled 2021-11-07: qty 40, 7d supply, fill #0

## 2021-11-07 MED ORDER — OSELTAMIVIR PHOSPHATE 75 MG PO CAPS
75.0000 mg | ORAL_CAPSULE | Freq: Two times a day (BID) | ORAL | 0 refills | Status: AC
  Filled 2021-11-07: qty 10, 5d supply, fill #0

## 2021-11-07 NOTE — ED Provider Notes (Signed)
[name]    CSN: [id number] Arrival date & time: 11/07/21  1038      History   Chief Complaint Chief Complaint  Patient presents with   Flu Like Symptoms     HPI Alejandra Watkins is a 45 y.o. female.   HPI Patient with a history of diabetes type 2, and obesity presents today with acute onset of sore throat, generalized  body aches, nausea, fever x1 day.  She is slightly febrile on arrival here today.  Symptoms developed abruptly overnight.  She had no known contact with anyone positive for flu or COVID.  Past Medical History:  Diagnosis Date   Acid reflux    Anemia    Diabetes mellitus without complication ([place])    Family history of colon cancer    History of chicken pox    Leiomyoma    Ovarian cyst    [facility] at [facility]    Patient Active Problem List   Diagnosis Date Noted   Iron deficiency anemia 11/03/2021   Pure hypercholesterolemia 07/19/2021   Morbid obesity ([place]) 07/14/2021   GERD (gastroesophageal reflux disease) 06/27/2017   DM (diabetes mellitus), type 2 ([place]) 03/30/2015    Past Surgical History:  Procedure Laterality Date   BREAST BIOPSY Left 15 yrs ago   needle biopsy. Benign   COLONOSCOPY WITH PROPOFOL N/A 09/09/2020   Procedure: COLONOSCOPY WITH PROPOFOL;  Surgeon: Virgel Manifold, MD;  Location: [facility] ENDOSCOPY;  Service: Endoscopy;  Laterality: N/A;   LAPAROSCOPIC SUPRACERVICAL HYSTERECTOMY  2013   OVARIAN CYST REMOVAL Left    TUBAL LIGATION      OB History     Gravida  3   Para  3   Term  3   Preterm      AB      Living  3      SAB      IAB      Ectopic      Multiple      Live Births  3            Home Medications    Prior to Admission medications   Medication Sig Start Date End Date Taking? Authorizing Provider  benzonatate (TESSALON) 100 MG capsule Take 2 capsules (200 mg total) by mouth 3 (three) times daily as needed for cough. 11/07/21  Yes Scot Jun, FNP  ondansetron (ZOFRAN-ODT) 4 MG  disintegrating tablet Take 1 tablet (4 mg total) by mouth every 8 (eight) hours as needed for nausea or vomiting. 11/07/21  Yes Scot Jun, FNP  azelastine (OPTIVAR) 0.05 % ophthalmic solution  11/02/20   [provider]  Biotin 10 MG CAPS Take by mouth.    [provider]  Cholecalciferol (VITAMIN D3) 5000 units CAPS Take 1 capsule by mouth daily.    [provider]  clotrimazole-betamethasone (LOTRISONE) cream Apply externally BID prn sx up to 2 wks 07/11/20   [name], NP  glucose blood (BAYER CONTOUR NEXT TEST) test strip 1 each by Other route 2 (two) times daily as needed for other. Use as instructed 03/17/19   [name], NP  Insulin Pen Needle (PEN NEEDLES) 31G X 5 MM MISC 1 Device by Does not apply route once a week. 07/19/21   [name], NP  ketoconazole (NIZORAL) 2 % cream     [provider]  levocetirizine (XYZAL) 5 MG tablet 1 tablet in the evening Orally Once a day 90 days 09/05/21  levonorgestrel-ethinyl estradiol (ALESSE) 0.1-20 MG-MCG tablet Take 1 tablet by mouth daily. CONTINUOUS DOSING 06/30/21 6/46/80  Copland, Elmo Putt B, PA-C  montelukast (SINGULAIR) 10 MG tablet 1 tablet in the evening Orally Once a day 90 days 09/05/21     Multiple Vitamins-Minerals (MULTIVITAMIN WOMEN) TABS Take 1 tablet by mouth daily.    [provider]  NON FORMULARY Ashwanganda    [provider]  oseltamivir (TAMIFLU) 75 MG capsule Take 1 capsule (75 mg total) by mouth 2 (two) times daily for 5 days. 11/07/21 11/12/21  Perlie Mayo, NP  Oxymetazoline HCl (UPNEEQ) 0.1 % SOLN Instill 1 drop into affected eye once a day 11/03/21     OZEMPIC, 0.25 OR 0.5 MG/DOSE, 2 MG/1.5ML SOPN Inject 0.25 mg into the skin once a week for 30 days, THEN 0.5 mg once a week. For first 4 weeks. Then increase dose to 0.5mg  weekly. 07/19/21 12/21/21  [name], NP  pantoprazole (PROTONIX) 40 MG tablet TAKE ONE TABLET BY MOUTH DAILY 07/14/21 07/14/22   [name], NP  vitamin B-12 (CYANOCOBALAMIN) 1000 MCG tablet Take 1,000 mcg by mouth daily.    [provider]    Family History Family History  Problem Relation Age of Onset   Hypertension Mother    Alcoholism Father    Hypertension Paternal Grandmother    Diabetes Paternal Grandmother        type 2   Colon cancer Other    Diabetes Other        type 2   Hypertension Other    Diabetes Other        type 2   Stroke Other    Lung cancer Paternal Uncle    Heart disease Neg Hx    Breast cancer Neg Hx     Social History Social History   Tobacco Use   Smoking status: Never   Smokeless tobacco: Never  Vaping Use   Vaping Use: Never used  Substance Use Topics   Alcohol use: Yes    Alcohol/week: 1.0 standard drink    Types: 1 Glasses of wine per week    Comment: occasional   Drug use: No     Allergies   No known allergies   Review of Systems Review of Systems Pertinent negatives listed in HPI  Physical Exam Triage Vital Signs ED Triage Vitals  Enc Vitals Group     BP 11/07/21 1103 120/79     Pulse Rate 11/07/21 1103 100     Resp --      Temp 11/07/21 1103 99 F (37.2 C)     Temp Source 11/07/21 1103 Oral     SpO2 11/07/21 1103 95 %     Weight --      Height --      Head Circumference --      Peak Flow --      Pain Score 11/07/21 1102 0     Pain Loc --      Pain Edu? --      Excl. in [place]? --    No data found.  Updated Vital Signs BP 120/79 (BP Location: Left Arm)   Pulse 100   Temp 99 F (37.2 C) (Oral)   LMP  (LMP Unknown)   SpO2 95%   Visual Acuity Right Eye Distance:   Left Eye Distance:   Bilateral Distance:    Right Eye Near:   Left Eye Near:    Bilateral Near:     Physical  Exam  General Appearance:    Alert, acutely ill-appearing, cooperative, no distress  HENT:   Normocephalic, ears normal, nares mucosal edema with congestion, rhinorrhea, oropharynx  erythematous   Eyes:    PERRL, conjunctiva/corneas clear, EOM's  intact       Lungs:     Clear to auscultation bilaterally, respirations unlabored  Heart:    Regular rate and rhythm  Neurologic:   Awake, alert, oriented x 3. No apparent focal neurological           defect.        UC Treatments / Results  Labs (all labs ordered are listed, but only abnormal results are displayed) Labs Reviewed  COVID-19, FLU A+B NAA    EKG   Radiology No results found.  Procedures Procedures (including critical care time)  Medications Ordered in UC Medications - No data to display  Initial Impression / Assessment and Plan / UC Course  I have reviewed the triage vital signs and the nursing notes.  Pertinent labs & imaging results that were available during my care of the patient were reviewed by me and considered in my medical decision making (see chart for details).     COVID/Flu test pending. Symptom management warranted only.  Manage fever with Tylenol and ibuprofen.  Nasal symptoms with over-the-counter antihistamines recommended.  Treatment per discharge medications/discharge instructions.  Red flags/ER precautions given. The most current CDC isolation/quarantine recommendation advised.   Final Clinical Impressions(s) / UC Diagnoses   Final diagnoses:  Encounter for screening for COVID-19  Influenza-like illness     Discharge Instructions      Your COVID/Flu results should result within 3-5 days. Negative results are immediately resulted to Mychart. Positive results will receive a follow-up call from our clinic. If symptoms are present, I recommend home quarantine until results are known.  Alternate Tylenol and ibuprofen as needed for body aches and fever.  Symptom management per recommendations discussed today.  If any breathing difficulty or chest pain develops go immediately to the closest emergency department for evaluation.      ED Prescriptions     Medication Sig Dispense Auth. Provider   ondansetron (ZOFRAN-ODT) 4 MG  disintegrating tablet Take 1 tablet (4 mg total) by mouth every 8 (eight) hours as needed for nausea or vomiting. 20 tablet Scot Jun, FNP   benzonatate (TESSALON) 100 MG capsule Take 2 capsules (200 mg total) by mouth 3 (three) times daily as needed for cough. 40 capsule Scot Jun, FNP      PDMP not reviewed this encounter.   Scot Jun, FNP 11/07/21 1140

## 2021-11-07 NOTE — Patient Instructions (Signed)
I appreciate the opportunity to provide you with care for your health and wellness.  Covid test today for r/o If neg get tamiflu to start, if + call [place]  Work note in [employer] section   I hope you feel better. Happy Thanksgiving  Please continue to practice social distancing to keep you, your family, and our community safe.  If you must go out, please wear a mask and practice good handwashing.  Have a wonderful day. With Gratitude, Cherly Beach, DNP, AGNP-BC

## 2021-11-07 NOTE — Discharge Instructions (Signed)
Your COVID/Flu results should result within 3-5 days. Negative results are immediately resulted to Mychart. Positive results will receive a follow-up call from our clinic. If symptoms are present, I recommend home quarantine until results are known.  Alternate Tylenol and ibuprofen as needed for body aches and fever.  Symptom management per recommendations discussed today.  If any breathing difficulty or chest pain develops go immediately to the closest emergency department for evaluation.

## 2021-11-07 NOTE — Progress Notes (Signed)
Virtual Visit Consent   Alejandra Watkins, you are scheduled for a virtual visit with a [facility] provider today.     Just as with appointments in the office, your consent must be obtained to participate.  Your consent will be active for this visit and any virtual visit you may have with one of our providers in the next 365 days.     If you have a MyChart account, a copy of this consent can be sent to you electronically.  All virtual visits are billed to your insurance company just like a traditional visit in the office.    As this is a virtual visit, video technology does not allow for your provider to perform a traditional examination.  This may limit your provider's ability to fully assess your condition.  If your provider identifies any concerns that need to be evaluated in person or the need to arrange testing (such as labs, EKG, etc.), we will make arrangements to do so.     Although advances in technology are sophisticated, we cannot ensure that it will always work on either your end or our end.  If the connection with a video visit is poor, the visit may have to be switched to a telephone visit.  With either a video or telephone visit, we are not always able to ensure that we have a secure connection.     I need to obtain your verbal consent now.   Are you willing to proceed with your visit today?    Alejandra Watkins has provided verbal consent on 11/07/2021 for a virtual visit (video or telephone).   Perlie Mayo, NP   Date: 11/07/2021 9:00 AM   Virtual Visit via Video Note   I, Perlie Mayo, connected with  Alejandra Watkins  ([id number], 10/03/1976) on 11/07/21 at  9:15 AM EST by a video-enabled telemedicine application and verified that I am speaking with the correct person using two identifiers.  Location: Patient: Virtual Visit Location Patient: Home Provider: Virtual Visit Location Provider: Home Office   I discussed the limitations of evaluation and management by  telemedicine and the availability of in person appointments. The patient expressed understanding and agreed to proceed.    History of Present Illness: Alejandra Watkins is a 45 y.o. who identifies as a female who was assigned female at birth, and is being seen today for flu like symptoms.  HPI: Influenza This is a new problem. The current episode started yesterday. The problem occurs constantly. The problem has been gradually worsening. Associated symptoms include chills, congestion, coughing, fatigue, a fever and headaches. Pertinent negatives include no chest pain, myalgias, nausea, sore throat or vomiting. Associated symptoms comments: Fever t max: 99.8. The symptoms are aggravated by coughing. She has tried rest (mucinex) for the symptoms. The treatment provided mild relief.    Denies known sick contacts.  Vaccine covid and the flu   Problems:  Patient Active Problem List   Diagnosis Date Noted   Iron deficiency anemia 11/03/2021   Pure hypercholesterolemia 07/19/2021   Morbid obesity ([place]) 07/14/2021   GERD (gastroesophageal reflux disease) 06/27/2017   DM (diabetes mellitus), type 2 ([place]) 03/30/2015    Allergies:  Allergies  Allergen Reactions   No Known Allergies    Medications:  Current Outpatient Medications:    azelastine (OPTIVAR) 0.05 % ophthalmic solution, , Disp: , Rfl:    Biotin 10 MG CAPS, Take by mouth., Disp: , Rfl:    Cholecalciferol (VITAMIN D3)  5000 units CAPS, Take 1 capsule by mouth daily., Disp: , Rfl:    clotrimazole-betamethasone (LOTRISONE) cream, Apply externally BID prn sx up to 2 wks, Disp: 15 g, Rfl: 0   glucose blood (BAYER CONTOUR NEXT TEST) test strip, 1 each by Other route 2 (two) times daily as needed for other. Use as instructed, Disp: 200 each, Rfl: 2   Insulin Pen Needle (PEN NEEDLES) 31G X 5 MM MISC, 1 Device by Does not apply route once a week., Disp: 30 each, Rfl: 0   ketoconazole (NIZORAL) 2 % cream, , Disp: , Rfl:    levocetirizine (XYZAL) 5  MG tablet, 1 tablet in the evening Orally Once a day 90 days, Disp: 90 tablet, Rfl: 2   levonorgestrel-ethinyl estradiol (ALESSE) 0.1-20 MG-MCG tablet, Take 1 tablet by mouth daily. CONTINUOUS DOSING, Disp: 84 tablet, Rfl: 2   montelukast (SINGULAIR) 10 MG tablet, 1 tablet in the evening Orally Once a day 90 days, Disp: 90 tablet, Rfl: 2   Multiple Vitamins-Minerals (MULTIVITAMIN WOMEN) TABS, Take 1 tablet by mouth daily., Disp: , Rfl:    NON FORMULARY, Ashwanganda, Disp: , Rfl:    Oxymetazoline HCl (UPNEEQ) 0.1 % SOLN, Instill 1 drop into affected eye once a day, Disp: 30 each, Rfl: 1   OZEMPIC, 0.25 OR 0.5 MG/DOSE, 2 MG/1.5ML SOPN, Inject 0.25 mg into the skin once a week for 30 days, THEN 0.5 mg once a week. For first 4 weeks. Then increase dose to 0.5mg  weekly., Disp: 1.5 mL, Rfl: 3   pantoprazole (PROTONIX) 40 MG tablet, TAKE ONE TABLET BY MOUTH DAILY, Disp: 90 tablet, Rfl: 3   vitamin B-12 (CYANOCOBALAMIN) 1000 MCG tablet, Take 1,000 mcg by mouth daily., Disp: , Rfl:   Observations/Objective: Patient is well-developed, well-nourished in no acute distress.  Resting comfortably  at home.  Head is normocephalic, atraumatic.  No labored breathing.  Speech is clear and coherent with logical content.  Patient is alert and oriented at baseline.    Assessment and Plan: 1. Flu-like symptoms S&S consistent with flu or some type of virus, but recommended covid  Work note for today- return tomorrow pending no fevers  Will call [facility] if covid test is + Tamiflu desired pending neg covid test   - oseltamivir (TAMIFLU) 75 MG capsule; Take 1 capsule (75 mg total) by mouth 2 (two) times daily for 5 days.  Dispense: 10 capsule; Refill: 0   Reviewed side effects, risks and benefits of medication.    Patient acknowledged agreement and understanding of the plan.  I discussed the assessment and treatment plan with the patient. The patient was provided an opportunity to ask questions and all were  answered. The patient agreed with the plan and demonstrated an understanding of the instructions.   The patient was advised to call back or seek an in-person evaluation if the symptoms worsen or if the condition fails to improve as anticipated.   The above assessment and management plan was discussed with the patient. The patient verbalized understanding of and has agreed to the management plan. Patient is aware to call the clinic if symptoms persist or worsen. Patient is aware when to return to the clinic for a follow-up visit. Patient educated on when it is appropriate to go to the emergency department.   Follow Up Instructions: I discussed the assessment and treatment plan with the patient. The patient was provided an opportunity to ask questions and all were answered. The patient agreed with the plan and demonstrated an  understanding of the instructions.  A copy of instructions were sent to the patient via MyChart unless otherwise noted below.    The patient was advised to call back or seek an in-person evaluation if the symptoms worsen or if the condition fails to improve as anticipated.  Time:  I spent 10 minutes with the patient via telehealth technology discussing the above problems/concerns.    Perlie Mayo, NP

## 2021-11-07 NOTE — ED Triage Notes (Signed)
Pt c/o cough, scratchy throat, chest congestion, bodyaches, nausea and fever sxs started yesterday.

## 2021-11-09 LAB — COVID-19, FLU A+B NAA
Influenza A, NAA: NOT DETECTED
Influenza B, NAA: NOT DETECTED
SARS-CoV-2, NAA: NOT DETECTED

## 2021-11-20 ENCOUNTER — Other Ambulatory Visit: Payer: Self-pay

## 2021-11-22 LAB — HM DIABETES EYE EXAM

## 2021-12-19 ENCOUNTER — Encounter: Payer: Self-pay | Admitting: Internal Medicine

## 2021-12-19 ENCOUNTER — Other Ambulatory Visit: Payer: Self-pay

## 2021-12-19 ENCOUNTER — Other Ambulatory Visit: Payer: Self-pay | Admitting: Internal Medicine

## 2021-12-20 ENCOUNTER — Other Ambulatory Visit: Payer: Self-pay | Admitting: Internal Medicine

## 2021-12-20 ENCOUNTER — Other Ambulatory Visit: Payer: Self-pay

## 2021-12-20 MED ORDER — OZEMPIC (0.25 OR 0.5 MG/DOSE) 2 MG/1.5ML ~~LOC~~ SOPN
PEN_INJECTOR | SUBCUTANEOUS | 3 refills | Status: DC
  Filled 2021-12-20: qty 3, 84d supply, fill #0

## 2022-02-02 ENCOUNTER — Other Ambulatory Visit: Payer: Self-pay | Admitting: Obstetrics and Gynecology

## 2022-02-02 ENCOUNTER — Other Ambulatory Visit: Payer: Self-pay

## 2022-02-02 DIAGNOSIS — Z3041 Encounter for surveillance of contraceptive pills: Secondary | ICD-10-CM

## 2022-02-02 MED ORDER — LEVONORGESTREL-ETHINYL ESTRAD 0.1-20 MG-MCG PO TABS
1.0000 | ORAL_TABLET | Freq: Every day | ORAL | 0 refills | Status: DC
  Filled 2022-02-02: qty 84, 63d supply, fill #0

## 2022-02-02 NOTE — Telephone Encounter (Signed)
Pt called our office requesting RF on her BC. Has annual with [facility] 02/06/22. Called pt to ask if she was completely out or had enough to take her to her annual appt. Pt says she is completely out. RF sent and she is aware.

## 2022-02-02 NOTE — Addendum Note (Signed)
Addended by: Drenda Freeze on: 02/02/2022 10:52 AM   Modules accepted: Orders

## 2022-02-06 ENCOUNTER — Other Ambulatory Visit: Payer: Self-pay

## 2022-02-06 ENCOUNTER — Encounter: Payer: Self-pay | Admitting: Obstetrics and Gynecology

## 2022-02-06 ENCOUNTER — Ambulatory Visit (INDEPENDENT_AMBULATORY_CARE_PROVIDER_SITE_OTHER): Payer: No Typology Code available for payment source | Admitting: Obstetrics and Gynecology

## 2022-02-06 VITALS — BP 100/74 | Ht 64.0 in | Wt 226.0 lb

## 2022-02-06 DIAGNOSIS — Z8742 Personal history of other diseases of the female genital tract: Secondary | ICD-10-CM | POA: Diagnosis not present

## 2022-02-06 DIAGNOSIS — Z1231 Encounter for screening mammogram for malignant neoplasm of breast: Secondary | ICD-10-CM

## 2022-02-06 DIAGNOSIS — Z3041 Encounter for surveillance of contraceptive pills: Secondary | ICD-10-CM

## 2022-02-06 DIAGNOSIS — Z01419 Encounter for gynecological examination (general) (routine) without abnormal findings: Secondary | ICD-10-CM

## 2022-02-06 DIAGNOSIS — N898 Other specified noninflammatory disorders of vagina: Secondary | ICD-10-CM

## 2022-02-06 MED ORDER — LEVONORGESTREL-ETHINYL ESTRAD 0.1-20 MG-MCG PO TABS
1.0000 | ORAL_TABLET | Freq: Every day | ORAL | 3 refills | Status: DC
  Filled 2022-02-06: qty 84, 84d supply, fill #0
  Filled 2022-04-05: qty 84, 63d supply, fill #0
  Filled 2022-07-16: qty 84, 63d supply, fill #1
  Filled 2022-10-01: qty 84, 63d supply, fill #2
  Filled 2022-12-04: qty 84, 63d supply, fill #3

## 2022-02-06 NOTE — Progress Notes (Signed)
PCP:  [name], NP   Chief Complaint  Patient presents with   Gynecologic Exam    No concerns    HPI:      [name] is a 46 y.o. G3P3000 who LMP was No LMP recorded (lmp unknown). Patient has had a hysterectomy., presents today for her annual examination.  Her menses are absent due to lap supracx hyst 2013 due to [place]. She does not have PMB. Pt on OCPs for hx of recurrent ovar cysts, started by [facility] a couple yrs ago. Pt is high risk for surgery. Breast tenderness resolved once changed to lower dose OCPs last yr.   Sex activity: single partner, contraception - status post hysterectomy. Has decreased libido and vaginal dryness, using lubricants with sx relief. Also notes vaginal dryness if not sexually active. Uses dial sens skin or oil of olay. No dryer sheets, wears cotton underwear, no thongs, no wipes. No other vag sx.  Last Pap: 01/26/21 Results were: no abnormalities /neg HPV DNA. Still has cx Hx of STDs: none  Last mammogram: 02/28/21  Results: no abnormalities, repeat in a 1 yr There is no FH of breast cancer. There is no FH of ovarian cancer. The patient does self-breast exams.  Tobacco use: The patient denies current or previous tobacco use. Alcohol use: social drinker No drug use.  Exercise: not active  Colonoscopy: 9/21 with polyp, repeat after 5 yrs.   She does get adequate calcium and Vitamin D in her diet.  Labs with PCP.   Past Medical History:  Diagnosis Date   Acid reflux    Anemia    Diabetes mellitus without complication (HCC)    Family history of colon cancer    History of chicken pox    Leiomyoma    Ovarian cyst    seen at [facility]    Past Surgical History:  Procedure Laterality Date   BREAST BIOPSY Left 15 yrs ago   needle biopsy. Benign   COLONOSCOPY WITH PROPOFOL N/A 09/09/2020   Procedure: COLONOSCOPY WITH PROPOFOL;  Surgeon: Virgel Manifold, MD;  Location: [facility] ENDOSCOPY;  Service: Endoscopy;  Laterality: N/A;   LAPAROSCOPIC  SUPRACERVICAL HYSTERECTOMY  2013   OVARIAN CYST REMOVAL Left    TUBAL LIGATION      Family History  Problem Relation Age of Onset   Hypertension Mother    Alcoholism Father    Hypertension Paternal Grandmother    Diabetes Paternal Grandmother        type 2   Colon cancer Other    Diabetes Other        type 2   Hypertension Other    Diabetes Other        type 2   Stroke Other    Lung cancer Paternal Uncle    Heart disease Neg Hx    Breast cancer Neg Hx     Social History   Socioeconomic History   Marital status: Married    Spouse name: Jori Moll   Number of children: 3   Years of education: Not on file   Highest education level: Not on file  Occupational History   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[employer]  Tobacco Use   Smoking status: Never   Smokeless tobacco: Never  Vaping Use   Vaping Use: Never used  Substance and Sexual Activity   Alcohol use: Yes    Alcohol/week: 1.0 standard drink    Types: 1 Glasses of wine per week  Comment: occasional   Drug use: No   Sexual activity: Yes    Birth control/protection: Surgical    Comment: Hysterectomy  Other Topics Concern   Not on file  Social History Narrative   Not on file   Social Determinants of Health   Financial Resource Strain: Not on file  Food Insecurity: Not on file  Transportation Needs: Not on file  Physical Activity: Not on file  Stress: Not on file  Social Connections: Not on file  Intimate Partner Violence: Not on file    Current Meds  Medication Sig   azelastine (OPTIVAR) 0.05 % ophthalmic solution    Biotin 10 MG CAPS Take by mouth.   Cholecalciferol (VITAMIN D3) 5000 units CAPS Take 1 capsule by mouth daily.   clotrimazole-betamethasone (LOTRISONE) cream Apply externally BID prn sx up to 2 wks   glucose blood (BAYER CONTOUR NEXT TEST) test strip 1 each by Other route 2 (two) times daily as needed for other. Use as instructed   Insulin Pen Needle (PEN NEEDLES) 31G X 5  MM MISC 1 Device by Does not apply route once a week.   ketoconazole (NIZORAL) 2 % cream    levocetirizine (XYZAL) 5 MG tablet 1 tablet in the evening Orally Once a day 90 days   montelukast (SINGULAIR) 10 MG tablet 1 tablet in the evening Orally Once a day 90 days   Multiple Vitamins-Minerals (MULTIVITAMIN WOMEN) TABS Take 1 tablet by mouth daily.   NON FORMULARY Ashwanganda   OZEMPIC, 0.25 OR 0.5 MG/DOSE, 2 MG/1.5ML SOPN Inject 0.25 mg into the skin once a week for 30 days, THEN 0.5 mg once a week. For first 4 weeks. Then increase dose to 0.5mg  weekly.   pantoprazole (PROTONIX) 40 MG tablet TAKE ONE TABLET BY MOUTH DAILY   vitamin B-12 (CYANOCOBALAMIN) 1000 MCG tablet Take 1,000 mcg by mouth daily.   [DISCONTINUED] levonorgestrel-ethinyl estradiol (ALESSE) 0.1-20 MG-MCG tablet Take 1 tablet by mouth daily. CONTINUOUS DOSING     ROS:  Review of Systems  Constitutional:  Negative for fatigue, fever and unexpected weight change.  Respiratory:  Negative for cough, shortness of breath and wheezing.   Cardiovascular:  Negative for chest pain, palpitations and leg swelling.  Gastrointestinal:  Negative for blood in stool, constipation, diarrhea, nausea and vomiting.  Endocrine: Negative for cold intolerance, heat intolerance and polyuria.  Genitourinary:  Negative for dyspareunia, dysuria, flank pain, frequency, genital sores, hematuria, menstrual problem, pelvic pain, urgency, vaginal bleeding, vaginal discharge and vaginal pain.  Musculoskeletal:  Negative for back pain, joint swelling and myalgias.  Skin:  Negative for rash.  Neurological:  Negative for dizziness, syncope, light-headedness, numbness and headaches.  Hematological:  Negative for adenopathy.  Psychiatric/Behavioral:  Negative for agitation, confusion, sleep disturbance and suicidal ideas. The patient is not nervous/anxious.     Objective: BP 100/74    Ht 5\' 4"  (1.626 m)    Wt 226 lb (102.5 kg)    LMP  (LMP Unknown)    BMI  38.79 kg/m    Physical Exam Constitutional:      Appearance: She is well-developed.  Genitourinary:     Vulva normal.     Right Labia: No rash, tenderness or lesions.    Left Labia: No tenderness, lesions or rash.    No vaginal discharge, erythema or tenderness.     No vaginal atrophy present.     Right Adnexa: not tender and no mass present.    Left Adnexa: not tender and no mass  present.    No cervical friability or polyp.     Uterus is not enlarged or tender.     Uterus is absent.  Breasts:    Right: No mass, nipple discharge, skin change or tenderness.     Left: No mass, nipple discharge, skin change or tenderness.  Neck:     Thyroid: No thyromegaly.  Cardiovascular:     Rate and Rhythm: Normal rate and regular rhythm.     Heart sounds: Normal heart sounds. No murmur heard. Pulmonary:     Effort: Pulmonary effort is normal.     Breath sounds: Normal breath sounds.  Abdominal:     Palpations: Abdomen is soft.     Tenderness: There is no abdominal tenderness. There is no guarding or rebound.  Musculoskeletal:        General: Normal range of motion.     Cervical back: Normal range of motion.  Lymphadenopathy:     Cervical: No cervical adenopathy.  Neurological:     General: No focal deficit present.     Mental Status: She is alert and oriented to person, place, and time.     Cranial Nerves: No cranial nerve deficit.  Skin:    General: Skin is warm and dry.  Psychiatric:        Mood and Affect: Mood normal.        Behavior: Behavior normal.        Thought Content: Thought content normal.        Judgment: Judgment normal.  Vitals reviewed.    Assessment/Plan: Encounter for annual routine gynecological examination  Encounter for surveillance of contraceptive pills - Plan: levonorgestrel-ethinyl estradiol (ALESSE) 0.1-20 MG-MCG tablet; OCP RF for ovarian cysts. F/u prn.   History of ovarian cyst--no sx on OCPs. High risk for surgery.  Encounter for screening  mammogram for malignant neoplasm of breast - Plan: MM 3D SCREEN BREAST BILATERAL; [facility]  Vaginal dryness--neg vag exam, on sufficient estrogen with OCPs. Question chem derm. Change to dove sens skin soap, can do coconut oil as moisturizer.  F/u prn.    Meds ordered this encounter  Medications   levonorgestrel-ethinyl estradiol (ALESSE) 0.1-20 MG-MCG tablet    Sig: Take 1 tablet by mouth daily. CONTINUOUS DOSING    Dispense:  84 tablet    Refill:  3    Order Specific Question:   Supervising Provider    Answer:   Gae Dry [[id number]]             GYN counsel breast self exam, mammography screening, adequate intake of calcium and vitamin D, diet and exercise     F/U  Return in about 1 year (around 02/06/2023).  Marycruz Boehner B. Rori Goar, PA-C 02/06/2022 3:31 PM

## 2022-02-06 NOTE — Patient Instructions (Addendum)
I value your feedback and you entrusting us with your care. If you get a [facility] patient survey, I would appreciate you taking the time to let us know about your experience today. Thank you!  [facility]: [phone]      

## 2022-02-09 ENCOUNTER — Ambulatory Visit (INDEPENDENT_AMBULATORY_CARE_PROVIDER_SITE_OTHER): Payer: No Typology Code available for payment source | Admitting: Internal Medicine

## 2022-02-09 ENCOUNTER — Encounter: Payer: Self-pay | Admitting: Internal Medicine

## 2022-02-09 ENCOUNTER — Other Ambulatory Visit: Payer: Self-pay

## 2022-02-09 VITALS — BP 118/80 | HR 80 | Temp 97.1°F | Wt 227.0 lb

## 2022-02-09 DIAGNOSIS — E7800 Pure hypercholesterolemia, unspecified: Secondary | ICD-10-CM

## 2022-02-09 DIAGNOSIS — E119 Type 2 diabetes mellitus without complications: Secondary | ICD-10-CM

## 2022-02-09 LAB — POCT GLYCOSYLATED HEMOGLOBIN (HGB A1C): Hemoglobin A1C: 5.6 % (ref 4.0–5.6)

## 2022-02-09 NOTE — Assessment & Plan Note (Signed)
Encouraged diet and exercise for weight loss ?

## 2022-02-09 NOTE — Assessment & Plan Note (Signed)
POCT A1c 5.6% We will continue Ozempic at current dose We will check urine microalbumin at annual exam Encourage low-carb diet and exercise weight loss Encourage routine eye exams Encourage routine foot exams Flu shot UTD She declines Pneumovax Encouraged her to get her COVID booster

## 2022-02-09 NOTE — Assessment & Plan Note (Signed)
We will check lipid profile with annual exam Encouraged her to consume a low-fat diet

## 2022-02-09 NOTE — Patient Instructions (Signed)

## 2022-02-09 NOTE — Progress Notes (Signed)
Subjective:    Patient ID: Alejandra Watkins, female    DOB: Jan 06, 1976, 46 y.o.   MRN: [id number]  HPI  Patient presents the clinic today for 24-month follow-up of chronic conditions.  DM2: Her last A1c was 5.9, 10/2021.  She is taking Ozempic as prescribed.  Her sugars range 85-95.  She checks her feet routinely.  Her last eye exam was 11/22/2021.  Flu 08/2021.  Pneumovax never.  Arboriculturist.  HLD: Her last LDL was 124, triglycerides 81, 10/2021.  She is not taking any cholesterol-lowering medication at this time.  She has been trying to consume a low-fat diet.  Review of Systems     Past Medical History:  Diagnosis Date   Acid reflux    Anemia    Diabetes mellitus without complication ([place])    Family history of colon cancer    History of chicken pox    Leiomyoma    Ovarian cyst    seen at [facility]    Current Outpatient Medications  Medication Sig Dispense Refill   azelastine (OPTIVAR) 0.05 % ophthalmic solution      Biotin 10 MG CAPS Take by mouth.     Cholecalciferol (VITAMIN D3) 5000 units CAPS Take 1 capsule by mouth daily.     clotrimazole-betamethasone (LOTRISONE) cream Apply externally BID prn sx up to 2 wks 15 g 0   glucose blood (BAYER CONTOUR NEXT TEST) test strip 1 each by Other route 2 (two) times daily as needed for other. Use as instructed 200 each 2   Insulin Pen Needle (PEN NEEDLES) 31G X 5 MM MISC 1 Device by Does not apply route once a week. 30 each 0   ketoconazole (NIZORAL) 2 % cream      levocetirizine (XYZAL) 5 MG tablet 1 tablet in the evening Orally Once a day 90 days 90 tablet 2   levonorgestrel-ethinyl estradiol (ALESSE) 0.1-20 MG-MCG tablet Take 1 tablet by mouth daily. CONTINUOUS DOSING 84 tablet 3   montelukast (SINGULAIR) 10 MG tablet 1 tablet in the evening Orally Once a day 90 days 90 tablet 2   Multiple Vitamins-Minerals (MULTIVITAMIN WOMEN) TABS Take 1 tablet by mouth daily.     NON FORMULARY Ashwanganda     OZEMPIC, 0.25 OR 0.5 MG/DOSE, 2  MG/1.5ML SOPN Inject 0.25 mg into the skin once a week for 30 days, THEN 0.5 mg once a week. For first 4 weeks. Then increase dose to 0.5mg  weekly. 1.5 mL 3   pantoprazole (PROTONIX) 40 MG tablet TAKE ONE TABLET BY MOUTH DAILY 90 tablet 3   vitamin B-12 (CYANOCOBALAMIN) 1000 MCG tablet Take 1,000 mcg by mouth daily.     No current facility-administered medications for this visit.    Allergies  Allergen Reactions   No Known Allergies     Family History  Problem Relation Age of Onset   Hypertension Mother    Alcoholism Father    Hypertension Paternal Grandmother    Diabetes Paternal Grandmother        type 2   Colon cancer Other    Diabetes Other        type 2   Hypertension Other    Diabetes Other        type 2   Stroke Other    Lung cancer Paternal Uncle    Heart disease Neg Hx    Breast cancer Neg Hx     Social History   Socioeconomic History   Marital status: Married  Spouse name: Jori Moll   Number of children: 3   Years of education: Not on file   Highest education level: Not on file  Occupational History   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[employer]  Tobacco Use   Smoking status: Never   Smokeless tobacco: Never  Vaping Use   Vaping Use: Never used  Substance and Sexual Activity   Alcohol use: Yes    Alcohol/week: 1.0 standard drink    Types: 1 Glasses of wine per week    Comment: occasional   Drug use: No   Sexual activity: Yes    Birth control/protection: Surgical    Comment: Hysterectomy  Other Topics Concern   Not on file  Social History Narrative   Not on file   Social Determinants of Health   Financial Resource Strain: Not on file  Food Insecurity: Not on file  Transportation Needs: Not on file  Physical Activity: Not on file  Stress: Not on file  Social Connections: Not on file  Intimate Partner Violence: Not on file     Constitutional: Denies fever, malaise, fatigue, headache or abrupt weight changes.  Respiratory:  Denies difficulty breathing, shortness of breath, cough or sputum production.   Cardiovascular: Denies chest pain, chest tightness, palpitations or swelling in the hands or feet.  Gastrointestinal: Denies abdominal pain, bloating, constipation, diarrhea or blood in the stool.  GU: Denies urgency, frequency, pain with urination, burning sensation, blood in urine, odor or discharge. Skin: Denies redness, rashes, lesions or ulcercations.  Neurological: Denies dizziness, difficulty with memory, difficulty with speech or problems with balance and coordination.   No other specific complaints in a complete review of systems (except as listed in HPI above).  Objective:   Physical Exam  BP 118/80 (BP Location: Right Arm, Patient Position: Sitting, Cuff Size: Large)    Pulse 80    Temp (!) 97.1 F (36.2 C) (Temporal)    Wt 227 lb (103 kg)    LMP  (LMP Unknown)    SpO2 100%    BMI 38.96 kg/m  Wt Readings from Last 3 Encounters:  02/09/22 227 lb (103 kg)  02/06/22 226 lb (102.5 kg)  11/03/21 237 lb 3.2 oz (107.6 kg)    General: Appears her stated age, obese, in NAD. Skin: Warm, dry and intact. No ulcerations noted. Cardiovascular: Normal rate. Pulmonary/Chest: Normal effort. Musculoskeletal: No difficulty with gait.  Neurological: Alert and oriented.  BMET    Component Value Date/Time   NA 137 07/14/2021 1428   NA 141 01/07/2017 0926   NA 141 10/30/2014 1545   K 4.3 07/14/2021 1428   K 4.5 10/30/2014 1545   CL 104 07/14/2021 1428   CL 107 10/30/2014 1545   CO2 24 07/14/2021 1428   CO2 28 10/30/2014 1545   GLUCOSE 83 07/14/2021 1428   GLUCOSE 83 10/30/2014 1545   BUN 15 07/14/2021 1428   BUN 12 01/07/2017 0926   BUN 16 10/30/2014 1545   CREATININE 0.79 07/14/2021 1428   CALCIUM 9.2 07/14/2021 1428   CALCIUM 8.0 (L) 10/30/2014 1545   GFRNONAA 93 01/07/2017 0926   GFRNONAA 54 (L) 10/30/2014 1545   GFRNONAA >60 05/08/2012 1158   GFRAA 107 01/07/2017 0926   GFRAA >60 10/30/2014 1545    GFRAA >60 05/08/2012 1158    Lipid Panel     Component Value Date/Time   CHOL 184 11/03/2021 1515   CHOL 219 (H) 01/07/2017 0926   TRIG 81 11/03/2021 1515   HDL  42 (L) 11/03/2021 1515   HDL 53 01/07/2017 0926   CHOLHDL 4.4 11/03/2021 1515   VLDL 11.8 07/11/2020 0910   LDLCALC 124 (H) 11/03/2021 1515    CBC    Component Value Date/Time   WBC 8.3 07/14/2021 1428   RBC 5.15 (H) 07/14/2021 1428   HGB 11.2 (L) 07/14/2021 1428   HGB 10.9 (L) 01/07/2017 1518   HCT 37.4 07/14/2021 1428   HCT 37.2 01/07/2017 1518   PLT 389 07/14/2021 1428   PLT 409 (H) 01/07/2017 1518   MCV 72.6 (L) 07/14/2021 1428   MCV 70 (L) 01/07/2017 1518   MCV 72 (L) 10/30/2014 1545   MCH 21.7 (L) 07/14/2021 1428   MCHC 29.9 (L) 07/14/2021 1428   RDW 16.0 (H) 07/14/2021 1428   RDW 17.5 (H) 01/07/2017 1518   RDW 15.5 (H) 10/30/2014 1545   LYMPHSABS 2.9 01/07/2017 1518   LYMPHSABS 2.6 10/30/2014 1545   MONOABS 1.1 (H) 07/23/2016 2117   MONOABS 0.8 10/30/2014 1545   EOSABS 0.2 01/07/2017 1518   EOSABS 0.1 10/30/2014 1545   BASOSABS 0.0 01/07/2017 1518   BASOSABS 0.1 10/30/2014 1545    Hgb A1C Lab Results  Component Value Date   HGBA1C 5.9 (H) 11/03/2021            Assessment & Plan:    Kizzie Furnish, CMA This visit occurred during the SARS-CoV-2 public health emergency.  Safety protocols were in place, including screening questions prior to the visit, additional usage of staff PPE, and extensive cleaning of exam room while observing appropriate contact time as indicated for disinfecting solutions.

## 2022-02-15 ENCOUNTER — Encounter: Payer: Self-pay | Admitting: Internal Medicine

## 2022-02-16 ENCOUNTER — Other Ambulatory Visit: Payer: Self-pay

## 2022-02-16 MED ORDER — OZEMPIC (0.25 OR 0.5 MG/DOSE) 2 MG/1.5ML ~~LOC~~ SOPN
0.5000 mg | PEN_INJECTOR | SUBCUTANEOUS | 1 refills | Status: DC
  Filled 2022-02-16 – 2022-02-19 (×3): qty 4.5, 84d supply, fill #0
  Filled 2022-05-17 – 2022-05-28 (×4): qty 4.5, 84d supply, fill #1

## 2022-02-19 ENCOUNTER — Other Ambulatory Visit: Payer: Self-pay

## 2022-02-26 ENCOUNTER — Other Ambulatory Visit: Payer: Self-pay

## 2022-03-21 ENCOUNTER — Other Ambulatory Visit: Payer: Self-pay

## 2022-03-23 ENCOUNTER — Ambulatory Visit
Admission: RE | Admit: 2022-03-23 | Discharge: 2022-03-23 | Disposition: A | Discharge status: Home / Self Care | Payer: No Typology Code available for payment source | Source: Ambulatory Visit | Attending: Obstetrics and Gynecology | Admitting: Obstetrics and Gynecology

## 2022-03-23 DIAGNOSIS — Z1231 Encounter for screening mammogram for malignant neoplasm of breast: Secondary | ICD-10-CM | POA: Insufficient documentation

## 2022-04-06 ENCOUNTER — Other Ambulatory Visit: Payer: Self-pay

## 2022-05-17 ENCOUNTER — Other Ambulatory Visit: Payer: Self-pay

## 2022-05-18 ENCOUNTER — Other Ambulatory Visit: Payer: Self-pay

## 2022-05-23 ENCOUNTER — Other Ambulatory Visit: Payer: Self-pay

## 2022-05-24 ENCOUNTER — Other Ambulatory Visit: Payer: Self-pay

## 2022-05-24 ENCOUNTER — Encounter: Payer: Self-pay | Admitting: Internal Medicine

## 2022-05-25 ENCOUNTER — Other Ambulatory Visit: Payer: Self-pay

## 2022-05-29 ENCOUNTER — Other Ambulatory Visit: Payer: Self-pay

## 2022-05-30 ENCOUNTER — Other Ambulatory Visit: Payer: Self-pay

## 2022-05-30 MED ORDER — OZEMPIC (0.25 OR 0.5 MG/DOSE) 2 MG/3ML ~~LOC~~ SOPN
PEN_INJECTOR | SUBCUTANEOUS | 2 refills | Status: DC
  Filled 2022-05-30: qty 9, 84d supply, fill #0

## 2022-05-31 ENCOUNTER — Other Ambulatory Visit: Payer: Self-pay

## 2022-06-15 ENCOUNTER — Other Ambulatory Visit: Payer: Self-pay

## 2022-07-16 ENCOUNTER — Other Ambulatory Visit: Payer: Self-pay

## 2022-07-20 ENCOUNTER — Encounter: Payer: Self-pay | Admitting: Internal Medicine

## 2022-07-20 ENCOUNTER — Ambulatory Visit (INDEPENDENT_AMBULATORY_CARE_PROVIDER_SITE_OTHER): Payer: No Typology Code available for payment source | Admitting: Internal Medicine

## 2022-07-20 VITALS — BP 114/76 | HR 72 | Temp 96.9°F | Ht 64.0 in | Wt 224.0 lb

## 2022-07-20 DIAGNOSIS — Z6838 Body mass index (BMI) 38.0-38.9, adult: Secondary | ICD-10-CM | POA: Diagnosis not present

## 2022-07-20 DIAGNOSIS — Z0001 Encounter for general adult medical examination with abnormal findings: Secondary | ICD-10-CM

## 2022-07-20 DIAGNOSIS — E119 Type 2 diabetes mellitus without complications: Secondary | ICD-10-CM | POA: Diagnosis not present

## 2022-07-20 NOTE — Assessment & Plan Note (Signed)
Encourage diet and exercise for weight loss 

## 2022-07-20 NOTE — Progress Notes (Signed)
Subjective:    Patient ID: Alejandra Watkins, female    DOB: 03/11/1976, 46 y.o.   MRN: [id number]  HPI  Patient presents to clinic today for her annual exam.  Flu: 08/2021 Tetanus: unsure COVID: Pfizer x2 Pneumovax: never Pap smear: Hysterectomy Mammogram: 03/2022 Colon screening: 08/2020 Vision screening: annually Dentist: biannually  Diet: She does eat some meat. She consumes fruits and veggies. She tries to avoid fried foods. She drinks mostly water, Dt. Soda, unsweet tea Exercise: None  Review of Systems     Past Medical History:  Diagnosis Date   Acid reflux    Anemia    Diabetes mellitus without complication (HCC)    Family history of colon cancer    History of chicken pox    Leiomyoma    Ovarian cyst    seen at [facility]    Current Outpatient Medications  Medication Sig Dispense Refill   azelastine (OPTIVAR) 0.05 % ophthalmic solution      Biotin 10 MG CAPS Take by mouth.     Cholecalciferol (VITAMIN D3) 5000 units CAPS Take 1 capsule by mouth daily.     clotrimazole-betamethasone (LOTRISONE) cream Apply externally BID prn sx up to 2 wks 15 g 0   glucose blood (BAYER CONTOUR NEXT TEST) test strip 1 each by Other route 2 (two) times daily as needed for other. Use as instructed 200 each 2   Insulin Pen Needle (PEN NEEDLES) 31G X 5 MM MISC 1 Device by Does not apply route once a week. 30 each 0   ketoconazole (NIZORAL) 2 % cream      levocetirizine (XYZAL) 5 MG tablet 1 tablet in the evening Orally Once a day 90 days 90 tablet 2   levonorgestrel-ethinyl estradiol (ALESSE) 0.1-20 MG-MCG tablet Take 1 tablet by mouth daily. CONTINUOUS DOSING 84 tablet 3   montelukast (SINGULAIR) 10 MG tablet 1 tablet in the evening Orally Once a day 90 days 90 tablet 2   Multiple Vitamins-Minerals (MULTIVITAMIN WOMEN) TABS Take 1 tablet by mouth daily.     NON FORMULARY Ashwanganda     pantoprazole (PROTONIX) 40 MG tablet TAKE ONE TABLET BY MOUTH DAILY 90 tablet 3   Semaglutide,0.25 or  0.5MG/DOS, (OZEMPIC, 0.25 OR 0.5 MG/DOSE,) 2 MG/3ML SOPN Inject 0.5 mg into the skin once a week 3 mL 2   vitamin B-12 (CYANOCOBALAMIN) 1000 MCG tablet Take 1,000 mcg by mouth daily.     No current facility-administered medications for this visit.    Allergies  Allergen Reactions   No Known Allergies     Family History  Problem Relation Age of Onset   Hypertension Mother    Alcoholism Father    Hypertension Paternal Grandmother    Diabetes Paternal Grandmother        type 2   Colon cancer Other    Diabetes Other        type 2   Hypertension Other    Diabetes Other        type 2   Stroke Other    Lung cancer Paternal Uncle    Heart disease Neg Hx    Breast cancer Neg Hx     Social History   Socioeconomic History   Marital status: Married    Spouse name: Jori Moll   Number of children: 3   Years of education: Not on file   Highest education level: Not on file  Occupational History   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[employer]  Use   Smoking status: Never   Smokeless tobacco: Never  Vaping Use   Vaping Use: Never used  Substance and Sexual Activity   Alcohol use: Yes    Alcohol/week: 1.0 standard drink of alcohol    Types: 1 Glasses of wine per week    Comment: occasional   Drug use: No   Sexual activity: Yes    Birth control/protection: Surgical    Comment: Hysterectomy  Other Topics Concern   Not on file  Social History Narrative   Not on file   Social Determinants of Health   Financial Resource Strain: Not on file  Food Insecurity: Not on file  Transportation Needs: Not on file  Physical Activity: Not on file  Stress: Not on file  Social Connections: Not on file  Intimate Partner Violence: Not on file     Constitutional: Denies fever, malaise, fatigue, headache or abrupt weight changes.  HEENT: Denies eye pain, eye redness, ear pain, ringing in the ears, wax buildup, runny nose, nasal congestion, bloody nose, or sore  throat. Respiratory: Denies difficulty breathing, shortness of breath, cough or sputum production.   Cardiovascular: Denies chest pain, chest tightness, palpitations or swelling in the hands or feet.  Gastrointestinal: Pt reports intermittent reflux, constipation. Denies abdominal pain, bloating, diarrhea or blood in the stool.  GU: Denies urgency, frequency, pain with urination, burning sensation, blood in urine, odor or discharge. Musculoskeletal: Denies decrease in range of motion, difficulty with gait, muscle pain or joint pain and swelling.  Skin: Denies redness, rashes, lesions or ulcercations.  Neurological: Denies dizziness, difficulty with memory, difficulty with speech or problems with balance and coordination.  Psych: Denies anxiety, depression, SI/HI.  No other specific complaints in a complete review of systems (except as listed in HPI above).  Objective:   Physical Exam  BP 114/76 (BP Location: Left Arm, Patient Position: Sitting, Cuff Size: Large)   Pulse 72   Temp (!) 96.9 F (36.1 C) (Temporal)   Ht _0  (1.626 m)   Wt 224 lb (101.6 kg)   LMP  (LMP Unknown)   SpO2 98%   BMI 38.45 kg/m   Wt Readings from Last 3 Encounters:  02/09/22 227 lb (103 kg)  02/06/22 226 lb (102.5 kg)  11/03/21 237 lb 3.2 oz (107.6 kg)    General: Appears her stated age, obese, in NAD. Skin: Warm, dry and intact. No ulcerations noted. HEENT: Head: normal shape and size; Eyes: sclera white, no icterus, conjunctiva pink, PERRLA and EOMs intact;  Neck:  Neck supple, trachea midline. No masses, lumps or thyromegaly present.  Cardiovascular: Normal rate and rhythm. S1,S2 noted.  No murmur, rubs or gallops noted. No JVD or BLE edema.  Pulmonary/Chest: Normal effort and positive vesicular breath sounds. No respiratory distress. No wheezes, rales or ronchi noted.  Abdomen: Soft and nontender. Normal bowel sounds.  Musculoskeletal: Strength 5/5 BUE/BLE.  No difficulty with gait.  Neurological:  Alert and oriented. Cranial nerves II-XII grossly intact. Coordination normal.  Psychiatric: Mood and affect normal. Behavior is normal. Judgment and thought content normal.     BMET    Component Value Date/Time   NA 137 07/14/2021 1428   NA 141 01/07/2017 0926   NA 141 10/30/2014 1545   K 4.3 07/14/2021 1428   K 4.5 10/30/2014 1545   CL 104 07/14/2021 1428   CL 107 10/30/2014 1545   CO2 24 07/14/2021 1428   CO2 28 10/30/2014 1545   GLUCOSE 83 07/14/2021 1428  GLUCOSE 83 10/30/2014 1545   BUN 15 07/14/2021 1428   BUN 12 01/07/2017 0926   BUN 16 10/30/2014 1545   CREATININE 0.79 07/14/2021 1428   CALCIUM 9.2 07/14/2021 1428   CALCIUM 8.0 (L) 10/30/2014 1545   GFRNONAA 93 01/07/2017 0926   GFRNONAA 54 (L) 10/30/2014 1545   GFRNONAA >60 05/08/2012 1158   GFRAA 107 01/07/2017 0926   GFRAA >60 10/30/2014 1545   GFRAA >60 05/08/2012 1158    Lipid Panel     Component Value Date/Time   CHOL 184 11/03/2021 1515   CHOL 219 (H) 01/07/2017 0926   TRIG 81 11/03/2021 1515   HDL 42 (L) 11/03/2021 1515   HDL 53 01/07/2017 0926   CHOLHDL 4.4 11/03/2021 1515   VLDL 11.8 07/11/2020 0910   LDLCALC 124 (H) 11/03/2021 1515    CBC    Component Value Date/Time   WBC 8.3 07/14/2021 1428   RBC 5.15 (H) 07/14/2021 1428   HGB 11.2 (L) 07/14/2021 1428   HGB 10.9 (L) 01/07/2017 1518   HCT 37.4 07/14/2021 1428   HCT 37.2 01/07/2017 1518   PLT 389 07/14/2021 1428   PLT 409 (H) 01/07/2017 1518   MCV 72.6 (L) 07/14/2021 1428   MCV 70 (L) 01/07/2017 1518   MCV 72 (L) 10/30/2014 1545   MCH 21.7 (L) 07/14/2021 1428   MCHC 29.9 (L) 07/14/2021 1428   RDW 16.0 (H) 07/14/2021 1428   RDW 17.5 (H) 01/07/2017 1518   RDW 15.5 (H) 10/30/2014 1545   LYMPHSABS 2.9 01/07/2017 1518   LYMPHSABS 2.6 10/30/2014 1545   MONOABS 1.1 (H) 07/23/2016 2117   MONOABS 0.8 10/30/2014 1545   EOSABS 0.2 01/07/2017 1518   EOSABS 0.1 10/30/2014 1545   BASOSABS 0.0 01/07/2017 1518   BASOSABS 0.1 10/30/2014 1545     Hgb A1C Lab Results  Component Value Date   HGBA1C 5.6 02/09/2022           Assessment & Plan:   Preventative Health Maintenance:  Encouraged her to get a flu shot in the fall Tetanus declined today Encouraged her to get a COVID booster Pneumovax declined today She no longer needs Pap smears Mammogram UTD Colon screening UTD Encouraged her to consume a balanced diet and exercise regimen Encouraged her to see an eye doctor and dentist annually We will check CBC, c-Met, lipid, A1c, urine microalbumin today  RTC in 6 months, follow-up chronic conditions Webb Silversmith, NP

## 2022-07-20 NOTE — Patient Instructions (Signed)

## 2022-07-21 LAB — CBC
HCT: 38.3 % (ref 35.0–45.0)
Hemoglobin: 11.8 g/dL (ref 11.7–15.5)
MCH: 22.2 pg — ABNORMAL LOW (ref 27.0–33.0)
MCHC: 30.8 g/dL — ABNORMAL LOW (ref 32.0–36.0)
MCV: 72.1 fL — ABNORMAL LOW (ref 80.0–100.0)
MPV: 9.4 fL (ref 7.5–12.5)
Platelets: 423 10*3/uL — ABNORMAL HIGH (ref 140–400)
RBC: 5.31 10*6/uL — ABNORMAL HIGH (ref 3.80–5.10)
RDW: 15.8 % — ABNORMAL HIGH (ref 11.0–15.0)
WBC: 6.4 10*3/uL (ref 3.8–10.8)

## 2022-07-21 LAB — COMPLETE METABOLIC PANEL WITH GFR
AG Ratio: 1.6 (calc) (ref 1.0–2.5)
ALT: 27 U/L (ref 6–29)
AST: 20 U/L (ref 10–35)
Albumin: 3.9 g/dL (ref 3.6–5.1)
Alkaline phosphatase (APISO): 79 U/L (ref 31–125)
BUN: 10 mg/dL (ref 7–25)
CO2: 28 mmol/L (ref 20–32)
Calcium: 9.3 mg/dL (ref 8.6–10.2)
Chloride: 103 mmol/L (ref 98–110)
Creat: 0.84 mg/dL (ref 0.50–0.99)
Globulin: 2.5 g/dL (calc) (ref 1.9–3.7)
Glucose, Bld: 82 mg/dL (ref 65–139)
Potassium: 4.2 mmol/L (ref 3.5–5.3)
Sodium: 138 mmol/L (ref 135–146)
Total Bilirubin: 0.3 mg/dL (ref 0.2–1.2)
Total Protein: 6.4 g/dL (ref 6.1–8.1)
eGFR: 86 mL/min/{1.73_m2} (ref >=60)

## 2022-07-21 LAB — LIPID PANEL
Cholesterol: 208 mg/dL — ABNORMAL HIGH (ref <200)
HDL: 48 mg/dL — ABNORMAL LOW (ref >=50)
LDL Cholesterol (Calc): 144 mg/dL (calc) — ABNORMAL HIGH
Non-HDL Cholesterol (Calc): 160 mg/dL (calc) — ABNORMAL HIGH (ref <130)
Total CHOL/HDL Ratio: 4.3 (calc) (ref <5.0)
Triglycerides: 64 mg/dL (ref <150)

## 2022-07-21 LAB — MICROALBUMIN / CREATININE URINE RATIO
Creatinine, Urine: 181 mg/dL (ref 20–275)
Microalb Creat Ratio: 1 mcg/mg creat (ref <30)
Microalb, Ur: 0.2 mg/dL

## 2022-07-21 LAB — HEMOGLOBIN A1C
Hgb A1c MFr Bld: 5.5 % of total Hgb (ref <5.7)
Mean Plasma Glucose: 111 mg/dL
eAG (mmol/L): 6.2 mmol/L

## 2022-07-23 ENCOUNTER — Other Ambulatory Visit: Payer: Self-pay

## 2022-07-23 ENCOUNTER — Encounter: Payer: Self-pay | Admitting: Internal Medicine

## 2022-07-23 MED ORDER — ATORVASTATIN CALCIUM 10 MG PO TABS
10.0000 mg | ORAL_TABLET | Freq: Every day | ORAL | 1 refills | Status: DC
  Filled 2022-07-23 – 2022-10-01 (×2): qty 90, 90d supply, fill #0

## 2022-08-09 ENCOUNTER — Other Ambulatory Visit: Payer: Self-pay

## 2022-08-15 ENCOUNTER — Telehealth: Payer: No Typology Code available for payment source | Admitting: Family Medicine

## 2022-08-15 DIAGNOSIS — M545 Low back pain, unspecified: Secondary | ICD-10-CM

## 2022-08-15 DIAGNOSIS — R300 Dysuria: Secondary | ICD-10-CM

## 2022-08-15 NOTE — Progress Notes (Signed)
[place]   Needs to be seen in person to rule out kidney given unilateral back pain with UTI symptoms.  Message sent

## 2022-08-27 ENCOUNTER — Other Ambulatory Visit: Payer: Self-pay | Admitting: Internal Medicine

## 2022-08-27 ENCOUNTER — Other Ambulatory Visit: Payer: Self-pay

## 2022-08-27 MED ORDER — LEVOCETIRIZINE DIHYDROCHLORIDE 5 MG PO TABS
5.0000 mg | ORAL_TABLET | Freq: Every evening | ORAL | 0 refills | Status: DC
  Filled 2022-08-27: qty 90, 90d supply, fill #0

## 2022-08-27 MED ORDER — MONTELUKAST SODIUM 10 MG PO TABS
10.0000 mg | ORAL_TABLET | Freq: Every evening | ORAL | 0 refills | Status: DC
  Filled 2022-08-27: qty 90, 90d supply, fill #0

## 2022-08-28 ENCOUNTER — Other Ambulatory Visit: Payer: Self-pay

## 2022-08-28 MED FILL — Semaglutide Soln Pen-inj 0.25 or 0.5 MG/DOSE (2 MG/3ML): SUBCUTANEOUS | 56 days supply | Qty: 6 | Fill #0 | Status: AC

## 2022-08-28 NOTE — Telephone Encounter (Signed)
Requested Prescriptions  Pending Prescriptions Disp Refills  . OZEMPIC, 0.25 OR 0.5 MG/DOSE, 2 MG/3ML SOPN [Pharmacy Med Name: Semaglutide,0.25 or 0.'5MG'$ /DOS, (OZEMPIC, 0.25 OR 0.5 MG/DOSE,) 2 MG/3ML Solution Pen-injector] 6 mL 2    Sig: Inject 0.5 mg into the skin once a week     Endocrinology:  Diabetes - GLP-1 Receptor Agonists - semaglutide Passed - 08/27/2022  9:56 AM      Passed - HBA1C in normal range and within 180 days    Hgb A1c MFr Bld  Date Value Ref Range Status  07/20/2022 5.5 <5.7 % of total Hgb Final    Comment:    For the purpose of screening for the presence of diabetes: . <5.7%       Consistent with the absence of diabetes 5.7-6.4%    Consistent with increased risk for diabetes             (prediabetes) > or =6.5%  Consistent with diabetes . This assay result is consistent with a decreased risk of diabetes. . Currently, no consensus exists regarding use of hemoglobin A1c for diagnosis of diabetes in children. . According to American Diabetes Association (ADA) guidelines, hemoglobin A1c <7.0% represents optimal control in non-pregnant diabetic patients. Different metrics may apply to specific patient populations.  Standards of Medical Care in Diabetes(ADA). .          Passed - Cr in normal range and within 360 days    Creat  Date Value Ref Range Status  07/20/2022 0.84 0.50 - 0.99 mg/dL Final   Creatinine,U  Date Value Ref Range Status  07/09/2019 175.6 mg/dL Final   Creatinine, Urine  Date Value Ref Range Status  07/20/2022 181 20 - 275 mg/dL Final         Passed - Valid encounter within last 6 months    Recent Outpatient Visits          1 month ago Encounter for general adult medical examination with abnormal findings   [facility] [place], Coralie Keens, NP   6 months ago Type 2 diabetes mellitus without complication, without long-term current use of insulin [facility])   [facility] [place], Coralie Keens, NP   9 months ago Type  2 diabetes mellitus without complication, without long-term current use of insulin [facility])   [facility] [place], Coralie Keens, NP   1 year ago Pure hypercholesterolemia   [place], Coralie Keens, NP   1 year ago Encounter for general adult medical examination with abnormal findings   [facility] [place], Coralie Keens, NP      Future Appointments            In 5 months Baity, Coralie Keens, NP [facility], [facility]

## 2022-10-01 ENCOUNTER — Other Ambulatory Visit: Payer: Self-pay | Admitting: Internal Medicine

## 2022-10-02 ENCOUNTER — Other Ambulatory Visit: Payer: Self-pay

## 2022-10-02 MED ORDER — PANTOPRAZOLE SODIUM 40 MG PO TBEC
DELAYED_RELEASE_TABLET | Freq: Every day | ORAL | 2 refills | Status: DC
  Filled 2022-10-02: qty 90, 90d supply, fill #0
  Filled 2023-01-03: qty 90, 90d supply, fill #1
  Filled 2023-04-05: qty 90, 90d supply, fill #2

## 2022-10-02 NOTE — Telephone Encounter (Signed)
Requested Prescriptions  Pending Prescriptions Disp Refills  . pantoprazole (PROTONIX) 40 MG tablet 90 tablet 2    Sig: TAKE ONE TABLET BY MOUTH DAILY     Gastroenterology: Proton Pump Inhibitors Passed - 10/01/2022 10:21 PM      Passed - Valid encounter within last 12 months    Recent Outpatient Visits          2 months ago Encounter for general adult medical examination with abnormal findings   [facility] [place], Coralie Keens, NP   7 months ago Type 2 diabetes mellitus without complication, without long-term current use of insulin [facility])   [facility], Coralie Keens, NP   11 months ago Type 2 diabetes mellitus without complication, without long-term current use of insulin [facility])   [facility], Coralie Keens, NP   1 year ago Pure hypercholesterolemia   [place], Coralie Keens, NP   1 year ago Encounter for general adult medical examination with abnormal findings   [facility] [place], Coralie Keens, NP      Future Appointments            In 3 months Baity, Coralie Keens, NP [facility], [facility]

## 2022-10-15 ENCOUNTER — Encounter (INDEPENDENT_AMBULATORY_CARE_PROVIDER_SITE_OTHER): Payer: Self-pay

## 2022-10-22 ENCOUNTER — Other Ambulatory Visit: Payer: Self-pay

## 2022-10-22 MED FILL — Semaglutide Soln Pen-inj 0.25 or 0.5 MG/DOSE (2 MG/3ML): SUBCUTANEOUS | 56 days supply | Qty: 6 | Fill #1 | Status: AC

## 2022-10-25 ENCOUNTER — Other Ambulatory Visit: Payer: No Typology Code available for payment source

## 2022-11-01 LAB — HM DIABETES EYE EXAM

## 2022-11-06 ENCOUNTER — Other Ambulatory Visit: Payer: Self-pay

## 2022-11-06 DIAGNOSIS — E7800 Pure hypercholesterolemia, unspecified: Secondary | ICD-10-CM

## 2022-11-07 ENCOUNTER — Other Ambulatory Visit: Payer: No Typology Code available for payment source

## 2022-11-08 LAB — LIPID PANEL
Cholesterol: 198 mg/dL (ref <200)
HDL: 46 mg/dL — ABNORMAL LOW (ref >=50)
LDL Cholesterol (Calc): 133 mg/dL (calc) — ABNORMAL HIGH
Non-HDL Cholesterol (Calc): 152 mg/dL (calc) — ABNORMAL HIGH (ref <130)
Total CHOL/HDL Ratio: 4.3 (calc) (ref <5.0)
Triglycerides: 88 mg/dL (ref <150)

## 2022-11-13 ENCOUNTER — Encounter: Payer: Self-pay | Admitting: Internal Medicine

## 2022-11-16 ENCOUNTER — Encounter: Payer: Self-pay | Admitting: Internal Medicine

## 2022-12-04 ENCOUNTER — Other Ambulatory Visit: Payer: Self-pay

## 2022-12-04 MED ORDER — MONTELUKAST SODIUM 10 MG PO TABS
10.0000 mg | ORAL_TABLET | Freq: Every day | ORAL | 2 refills | Status: DC
  Filled 2022-12-04: qty 90, 90d supply, fill #0
  Filled 2023-04-05: qty 90, 90d supply, fill #1
  Filled 2023-07-17: qty 90, 90d supply, fill #2

## 2022-12-04 MED ORDER — LEVOCETIRIZINE DIHYDROCHLORIDE 5 MG PO TABS
5.0000 mg | ORAL_TABLET | Freq: Every day | ORAL | 2 refills | Status: DC
  Filled 2022-12-04: qty 90, 90d supply, fill #0
  Filled 2023-04-05: qty 90, 90d supply, fill #1
  Filled 2023-07-17: qty 90, 90d supply, fill #2

## 2022-12-07 IMAGING — MG MM DIGITAL SCREENING BILAT W/ TOMO AND CAD
8 series · 8 of 24 positions shown · non-contrast
Comparison: Previous exam(s).

CLINICAL DATA: Screening.

EXAM:
DIGITAL SCREENING BILATERAL MAMMOGRAM WITH TOMOSYNTHESIS AND CAD
TECHNIQUE: Bilateral screening digital craniocaudal and mediolateral oblique
mammograms were obtained. Bilateral screening digital breast
tomosynthesis was performed. The images were evaluated with
computer-aided detection.

[R MLO synth-2D]
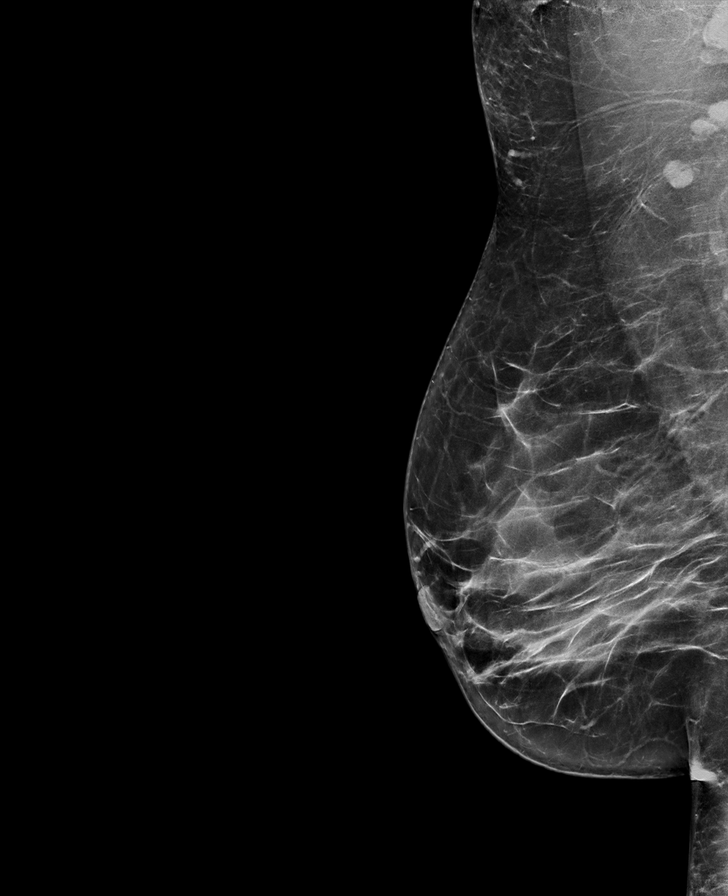

[L MLO synth-2D]
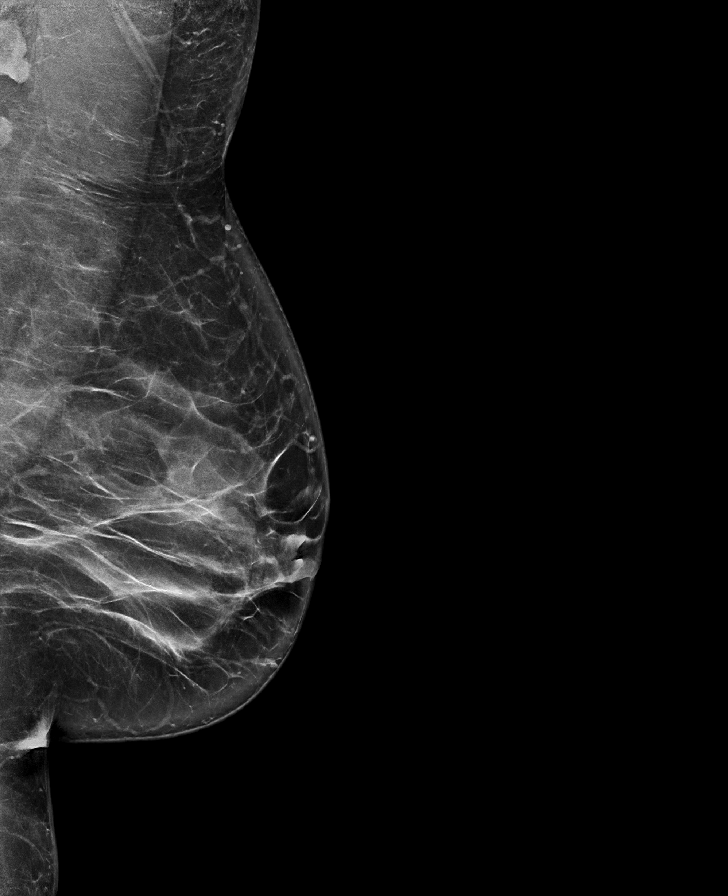

[L CC synth-2D]
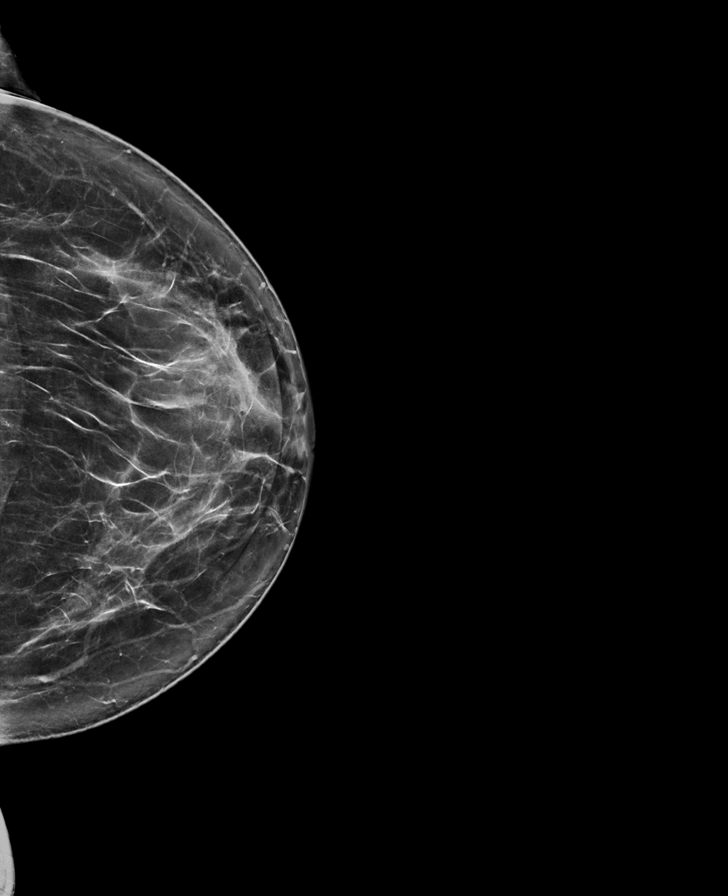

[R CC synth-2D]
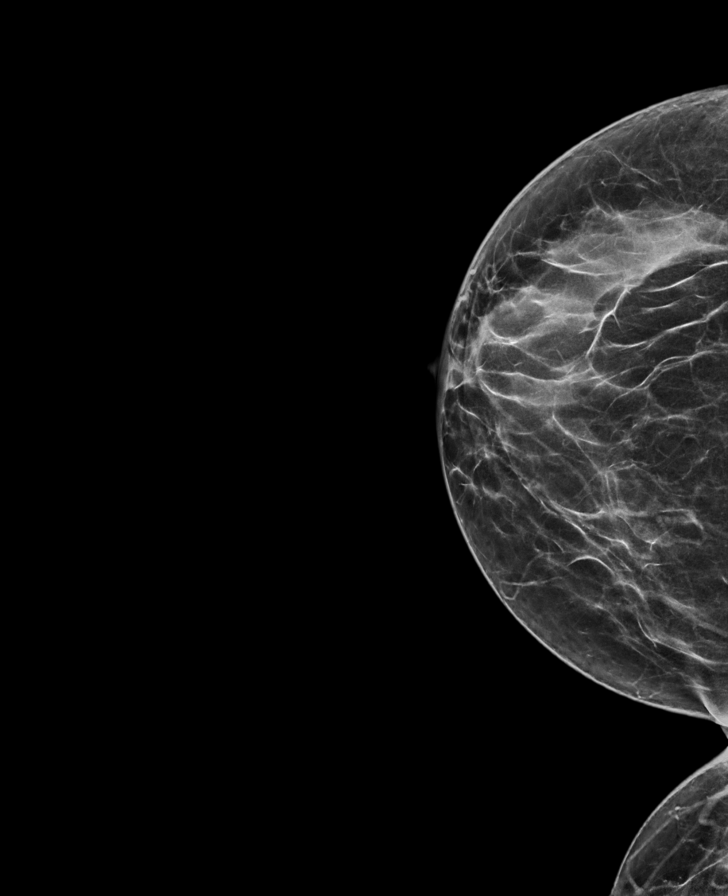

[L CC tomo · tomo slice 41/80.0]
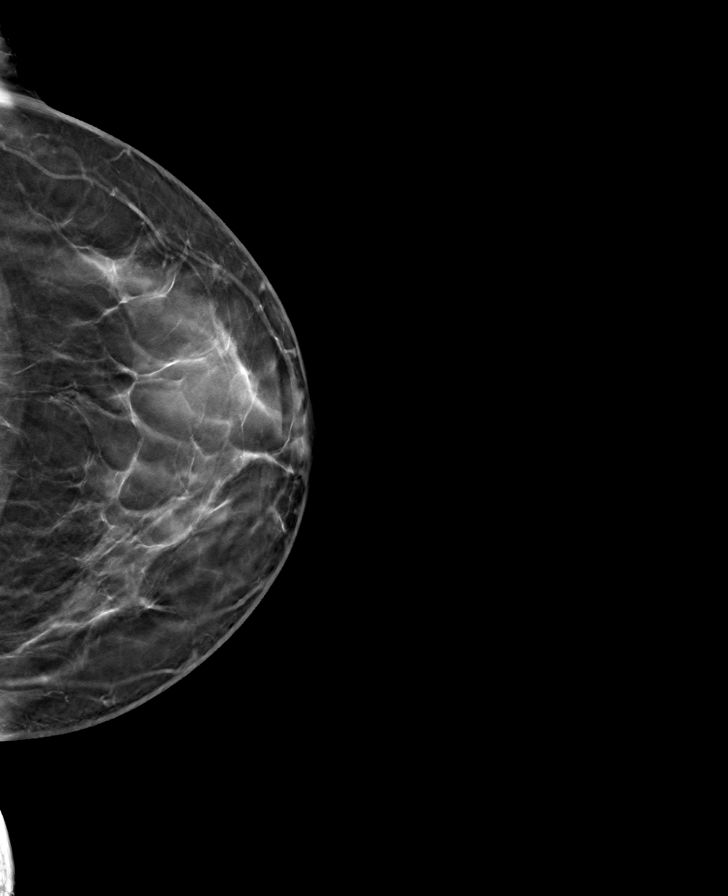

[R CC tomo · tomo slice 31/61.0]
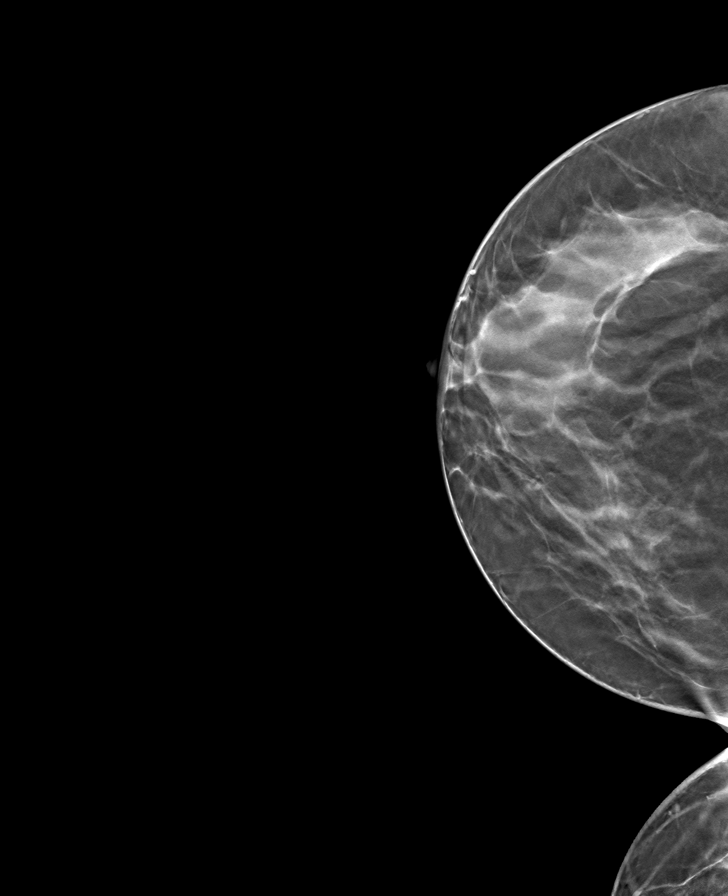

[R MLO tomo · tomo slice 39/78.0]
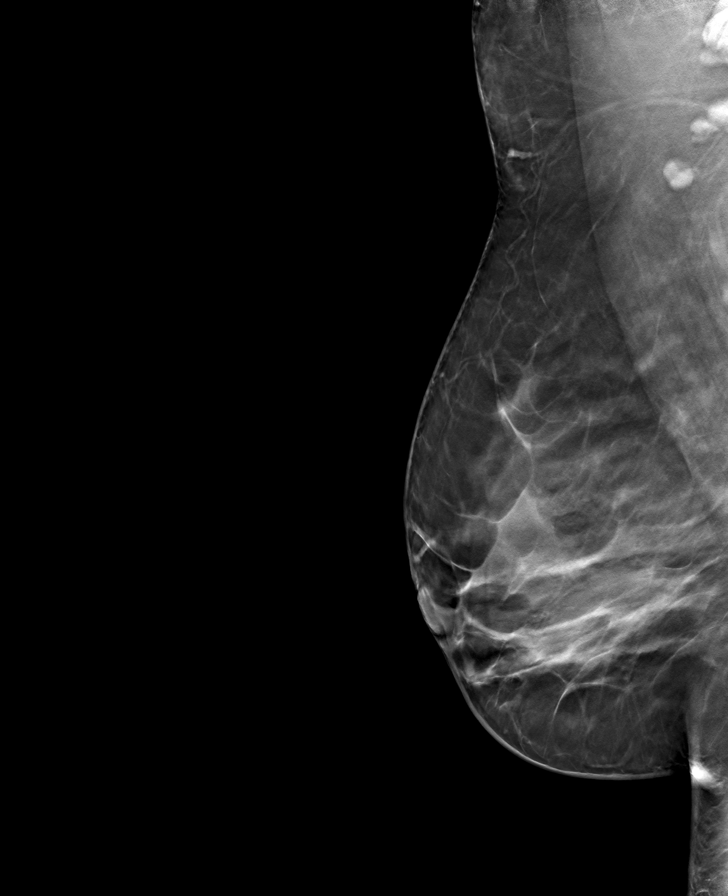

[L MLO tomo · tomo slice 40/79.0]
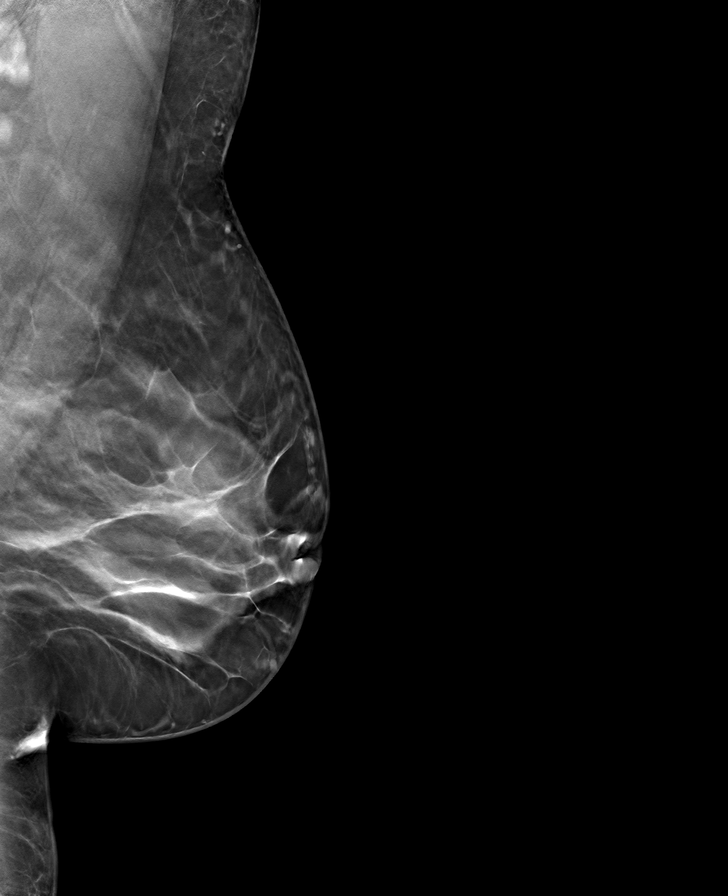

[8 of 24 positions shown; findings below may reference images not displayed]

ACR Breast Density Category c: The breast tissue is heterogeneously
dense, which may obscure small masses.
FINDINGS: There are no findings suspicious for malignancy.
IMPRESSION: No mammographic evidence of malignancy. A result letter of this
screening mammogram will be mailed directly to the patient.

RECOMMENDATION:
Screening mammogram in one year. (Code:Q3-W-BC3)

BI-RADS CATEGORY  1: Negative.

## 2023-01-03 ENCOUNTER — Other Ambulatory Visit: Payer: Self-pay

## 2023-01-03 MED FILL — Semaglutide Soln Pen-inj 0.25 or 0.5 MG/DOSE (2 MG/3ML): SUBCUTANEOUS | 28 days supply | Qty: 3 | Fill #2 | Status: AC

## 2023-01-25 ENCOUNTER — Encounter: Payer: Self-pay | Admitting: Internal Medicine

## 2023-01-25 ENCOUNTER — Ambulatory Visit (INDEPENDENT_AMBULATORY_CARE_PROVIDER_SITE_OTHER): Payer: 59 | Admitting: Internal Medicine

## 2023-01-25 VITALS — BP 126/82 | HR 69 | Temp 98.0°F | Wt 209.0 lb

## 2023-01-25 DIAGNOSIS — D500 Iron deficiency anemia secondary to blood loss (chronic): Secondary | ICD-10-CM

## 2023-01-25 DIAGNOSIS — E7800 Pure hypercholesterolemia, unspecified: Secondary | ICD-10-CM | POA: Diagnosis not present

## 2023-01-25 DIAGNOSIS — D75839 Thrombocytosis, unspecified: Secondary | ICD-10-CM | POA: Diagnosis not present

## 2023-01-25 DIAGNOSIS — E119 Type 2 diabetes mellitus without complications: Secondary | ICD-10-CM

## 2023-01-25 DIAGNOSIS — Z6838 Body mass index (BMI) 38.0-38.9, adult: Secondary | ICD-10-CM

## 2023-01-25 DIAGNOSIS — K219 Gastro-esophageal reflux disease without esophagitis: Secondary | ICD-10-CM | POA: Diagnosis not present

## 2023-01-25 LAB — POCT GLYCOSYLATED HEMOGLOBIN (HGB A1C): HbA1c, POC (prediabetic range): 5.8 % (ref 5.7–6.4)

## 2023-01-25 NOTE — Assessment & Plan Note (Addendum)
A1c POC 5.8% Encourage low-carb diet and exercise for weight loss Continue Ozempic Encourage routine eye exam Encouraged routine foot exam Flu shot UTD She declines Pneumovax Encouraged her to get her COVID booster

## 2023-01-25 NOTE — Assessment & Plan Note (Signed)
Avoid foods that trigger reflux Encourage weight loss as this can produce reflux symptoms Continue pantoprazole

## 2023-01-25 NOTE — Assessment & Plan Note (Signed)
Encourage diet and exercise for weight loss 

## 2023-01-25 NOTE — Patient Instructions (Signed)

## 2023-01-25 NOTE — Assessment & Plan Note (Signed)
CBC today.  

## 2023-01-25 NOTE — Progress Notes (Signed)
Subjective:    Patient ID: Alejandra Watkins, female    DOB: Mar 12, 1976, 47 y.o.   MRN: [id number]  HPI  Patient presents to clinic today for follow-up of chronic conditions.  DM2: Her last A1c was 5.5%, 07/2022.  She is taking Ozempic as prescribed.  She is not checking her sugars. She checks her feet routinely.  Her last eye exam was 10/2022.  Flu 08/2022.  Pneumovax never.  [place] x 2.  HLD: Her last LDL was 144, triglycerides 64, 07/2022.  She is not taking Atorvastatin as prescribed.  She tries to consume low-fat diet.  GERD: Triggered by greasy, fried foods.  She denies breakthrough on Pantoprazole.  There is no upper GI on file.  Iron Deficiency Anemia: Her last H/H was 11.8/30.3, 07/2022.  She is not taking any oral Iron as prescribed.  She does not follow with hematology.  Thrombocytosis: Her last platelet count was 423, 07/2022.  She does not follow with hematology.  Review of Systems     Past Medical History:  Diagnosis Date   Acid reflux    Anemia    Diabetes mellitus without complication ([place])    Family history of colon cancer    History of chicken pox    Leiomyoma    Ovarian cyst    seen at [facility]    Current Outpatient Medications  Medication Sig Dispense Refill   atorvastatin (LIPITOR) 10 MG tablet Take 1 tablet (10 mg total) by mouth daily. 90 tablet 1   azelastine (OPTIVAR) 0.05 % ophthalmic solution      Biotin 10 MG CAPS Take by mouth.     Cholecalciferol (VITAMIN D3) 5000 units CAPS Take 1 capsule by mouth daily.     clotrimazole-betamethasone (LOTRISONE) cream Apply externally BID prn sx up to 2 wks 15 g 0   glucose blood (BAYER CONTOUR NEXT TEST) test strip 1 each by Other route 2 (two) times daily as needed for other. Use as instructed 200 each 2   Insulin Pen Needle (PEN NEEDLES) 31G X 5 MM MISC 1 Device by Does not apply route once a week. 30 each 0   ketoconazole (NIZORAL) 2 % cream      levocetirizine (XYZAL) 5 MG tablet Take 1 tablet (5 mg total)  by mouth daily. 90 tablet 2   levonorgestrel-ethinyl estradiol (ALESSE) 0.1-20 MG-MCG tablet Take 1 tablet by mouth daily. CONTINUOUS DOSING 84 tablet 3   montelukast (SINGULAIR) 10 MG tablet Take 1 tablet (10 mg total) by mouth daily. 90 tablet 2   Multiple Vitamins-Minerals (MULTIVITAMIN WOMEN) TABS Take 1 tablet by mouth daily.     NON FORMULARY Ashwanganda     Semaglutide,0.25 or 0.5MG/DOS, (OZEMPIC, 0.25 OR 0.5 MG/DOSE,) 2 MG/3ML SOPN Inject 0.5 mg into the skin once a week 6 mL 2   pantoprazole (PROTONIX) 40 MG tablet TAKE ONE TABLET BY MOUTH DAILY 90 tablet 2   vitamin B-12 (CYANOCOBALAMIN) 1000 MCG tablet Take 1,000 mcg by mouth daily.     No current facility-administered medications for this visit.    Allergies  Allergen Reactions   No Known Allergies     Family History  Problem Relation Age of Onset   Hypertension Mother    Alcoholism Father    Hypertension Paternal Grandmother    Diabetes Paternal Grandmother        type 2   Colon cancer Other    Diabetes Other        type 2   Hypertension  Other    Diabetes Other        type 2   Stroke Other    Lung cancer Paternal Uncle    Heart disease Neg Hx    Breast cancer Neg Hx     Social History   Socioeconomic History   Marital status: Married    Spouse name: Jori Moll   Number of children: 3   Years of education: Not on file   Highest education level: Not on file  Occupational History   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[employer]  Tobacco Use   Smoking status: Never   Smokeless tobacco: Never  Vaping Use   Vaping Use: Never used  Substance and Sexual Activity   Alcohol use: Yes    Alcohol/week: 1.0 standard drink of alcohol    Types: 1 Glasses of wine per week    Comment: occasional   Drug use: No   Sexual activity: Yes    Birth control/protection: Surgical    Comment: Hysterectomy  Other Topics Concern   Not on file  Social History Narrative   Not on file   Social Determinants of  Health   Financial Resource Strain: Not on file  Food Insecurity: Not on file  Transportation Needs: Not on file  Physical Activity: Not on file  Stress: Not on file  Social Connections: Not on file  Intimate Partner Violence: Not on file     Constitutional: Denies fever, malaise, fatigue, headache or abrupt weight changes.  HEENT: Denies eye pain, eye redness, ear pain, ringing in the ears, wax buildup, runny nose, nasal congestion, bloody nose, or sore throat. Respiratory: Denies difficulty breathing, shortness of breath, cough or sputum production.   Cardiovascular: Denies chest pain, chest tightness, palpitations or swelling in the hands or feet.  Gastrointestinal: Denies abdominal pain, bloating, constipation, diarrhea or blood in the stool.  GU: Denies urgency, frequency, pain with urination, burning sensation, blood in urine, odor or discharge. Musculoskeletal: Denies decrease in range of motion, difficulty with gait, muscle pain or joint pain and swelling.  Skin: Denies redness, rashes, lesions or ulcercations.  Neurological: Denies dizziness, difficulty with memory, difficulty with speech or problems with balance and coordination.  Psych: Denies anxiety, depression, SI/HI.  No other specific complaints in a complete review of systems (except as listed in HPI above).  Objective:   Physical Exam    BP 126/82 (BP Location: Left Arm, Patient Position: Sitting, Cuff Size: Normal)   Pulse 69   Temp 98 F (36.7 C) (Temporal)   Wt 223 lb (101.2 kg)   LMP  (LMP Unknown)   SpO2 99%   BMI 38.28 kg/m   Wt Readings from Last 3 Encounters:  07/20/22 224 lb (101.6 kg)  02/09/22 227 lb (103 kg)  02/06/22 226 lb (102.5 kg)    General: Appears her stated age, obese in NAD. Skin: Warm, dry and intact. No  ulcerations noted. HEENT: Head: normal shape and size; Eyes: sclera white, no icterus, conjunctiva pink, PERRLA and EOMs intact;  Neck:  Neck supple, trachea midline. No  masses, lumps or thyromegaly present.  Cardiovascular: Normal rate and rhythm. S1,S2 noted.  No murmur, rubs or gallops noted. No JVD or BLE edema.  Pulmonary/Chest: Normal effort and positive vesicular breath sounds. No respiratory distress. No wheezes, rales or ronchi noted.  Abdomen: Normal bowel sounds. Musculoskeletal:  No difficulty with gait.  Neurological: Alert and oriented.Coordination normal.  Psychiatric: Mood and affect normal. Behavior is normal. Judgment and thought  content normal.     BMET    Component Value Date/Time   NA 138 07/20/2022 1526   NA 141 01/07/2017 0926   NA 141 10/30/2014 1545   K 4.2 07/20/2022 1526   K 4.5 10/30/2014 1545   CL 103 07/20/2022 1526   CL 107 10/30/2014 1545   CO2 28 07/20/2022 1526   CO2 28 10/30/2014 1545   GLUCOSE 82 07/20/2022 1526   GLUCOSE 83 10/30/2014 1545   BUN 10 07/20/2022 1526   BUN 12 01/07/2017 0926   BUN 16 10/30/2014 1545   CREATININE 0.84 07/20/2022 1526   CALCIUM 9.3 07/20/2022 1526   CALCIUM 8.0 (L) 10/30/2014 1545   GFRNONAA 93 01/07/2017 0926   GFRNONAA 54 (L) 10/30/2014 1545   GFRNONAA >60 05/08/2012 1158   GFRAA 107 01/07/2017 0926   GFRAA >60 10/30/2014 1545   GFRAA >60 05/08/2012 1158    Lipid Panel     Component Value Date/Time   CHOL 198 11/06/2022 1603   CHOL 219 (H) 01/07/2017 0926   TRIG 88 11/06/2022 1603   HDL 46 (L) 11/06/2022 1603   HDL 53 01/07/2017 0926   CHOLHDL 4.3 11/06/2022 1603   VLDL 11.8 07/11/2020 0910   LDLCALC 133 (H) 11/06/2022 1603    CBC    Component Value Date/Time   WBC 6.4 07/20/2022 1526   RBC 5.31 (H) 07/20/2022 1526   HGB 11.8 07/20/2022 1526   HGB 10.9 (L) 01/07/2017 1518   HCT 38.3 07/20/2022 1526   HCT 37.2 01/07/2017 1518   PLT 423 (H) 07/20/2022 1526   PLT 409 (H) 01/07/2017 1518   MCV 72.1 (L) 07/20/2022 1526   MCV 70 (L) 01/07/2017 1518   MCV 72 (L) 10/30/2014 1545   MCH 22.2 (L) 07/20/2022 1526   MCHC 30.8 (L) 07/20/2022 1526   RDW 15.8 (H)  07/20/2022 1526   RDW 17.5 (H) 01/07/2017 1518   RDW 15.5 (H) 10/30/2014 1545   LYMPHSABS 2.9 01/07/2017 1518   LYMPHSABS 2.6 10/30/2014 1545   MONOABS 1.1 (H) 07/23/2016 2117   MONOABS 0.8 10/30/2014 1545   EOSABS 0.2 01/07/2017 1518   EOSABS 0.1 10/30/2014 1545   BASOSABS 0.0 01/07/2017 1518   BASOSABS 0.1 10/30/2014 1545    Hgb A1C Lab Results  Component Value Date   HGBA1C 5.5 07/20/2022          Assessment & Plan:      RTC in 6 months for annual exam Webb Silversmith, NP

## 2023-01-25 NOTE — Assessment & Plan Note (Signed)
C-Met and lipid profile today Encouraged her to consume low-fat diet She is not taking atorvastatin as prescribed

## 2023-01-26 ENCOUNTER — Encounter: Payer: Self-pay | Admitting: Internal Medicine

## 2023-01-26 LAB — COMPLETE METABOLIC PANEL WITH GFR
AG Ratio: 1.6 (calc) (ref 1.0–2.5)
ALT: 12 U/L (ref 6–29)
AST: 13 U/L (ref 10–35)
Albumin: 3.9 g/dL (ref 3.6–5.1)
Alkaline phosphatase (APISO): 69 U/L (ref 31–125)
BUN: 11 mg/dL (ref 7–25)
CO2: 25 mmol/L (ref 20–32)
Calcium: 8.8 mg/dL (ref 8.6–10.2)
Chloride: 108 mmol/L (ref 98–110)
Creat: 0.98 mg/dL (ref 0.50–0.99)
Globulin: 2.4 g/dL (calc) (ref 1.9–3.7)
Glucose, Bld: 104 mg/dL — ABNORMAL HIGH (ref 65–99)
Potassium: 4.1 mmol/L (ref 3.5–5.3)
Sodium: 140 mmol/L (ref 135–146)
Total Bilirubin: 0.3 mg/dL (ref 0.2–1.2)
Total Protein: 6.3 g/dL (ref 6.1–8.1)
eGFR: 71 mL/min/{1.73_m2} (ref >=60)

## 2023-01-26 LAB — CBC
HCT: 37.1 % (ref 35.0–45.0)
Hemoglobin: 11.6 g/dL — ABNORMAL LOW (ref 11.7–15.5)
MCH: 22.7 pg — ABNORMAL LOW (ref 27.0–33.0)
MCHC: 31.3 g/dL — ABNORMAL LOW (ref 32.0–36.0)
MCV: 72.6 fL — ABNORMAL LOW (ref 80.0–100.0)
MPV: 9.6 fL (ref 7.5–12.5)
Platelets: 356 10*3/uL (ref 140–400)
RBC: 5.11 10*6/uL — ABNORMAL HIGH (ref 3.80–5.10)
RDW: 14.4 % (ref 11.0–15.0)
WBC: 5.9 10*3/uL (ref 3.8–10.8)

## 2023-01-26 LAB — LIPID PANEL
Cholesterol: 195 mg/dL (ref <200)
HDL: 42 mg/dL — ABNORMAL LOW (ref >=50)
LDL Cholesterol (Calc): 136 mg/dL (calc) — ABNORMAL HIGH
Non-HDL Cholesterol (Calc): 153 mg/dL (calc) — ABNORMAL HIGH (ref <130)
Total CHOL/HDL Ratio: 4.6 (calc) (ref <5.0)
Triglycerides: 77 mg/dL (ref <150)

## 2023-01-28 ENCOUNTER — Encounter: Payer: Self-pay | Admitting: Internal Medicine

## 2023-02-17 ENCOUNTER — Other Ambulatory Visit: Payer: Self-pay | Admitting: Obstetrics and Gynecology

## 2023-02-17 DIAGNOSIS — Z3041 Encounter for surveillance of contraceptive pills: Secondary | ICD-10-CM

## 2023-02-17 MED FILL — Semaglutide Soln Pen-inj 0.25 or 0.5 MG/DOSE (2 MG/3ML): SUBCUTANEOUS | 28 days supply | Qty: 3 | Fill #3 | Status: AC

## 2023-02-18 ENCOUNTER — Other Ambulatory Visit: Payer: Self-pay

## 2023-02-19 ENCOUNTER — Other Ambulatory Visit: Payer: Self-pay

## 2023-02-25 ENCOUNTER — Other Ambulatory Visit: Payer: Self-pay | Admitting: Obstetrics and Gynecology

## 2023-02-25 DIAGNOSIS — Z3041 Encounter for surveillance of contraceptive pills: Secondary | ICD-10-CM

## 2023-02-26 ENCOUNTER — Telehealth: Payer: Self-pay

## 2023-02-26 ENCOUNTER — Other Ambulatory Visit: Payer: Self-pay

## 2023-02-26 ENCOUNTER — Other Ambulatory Visit: Payer: Self-pay | Admitting: Obstetrics and Gynecology

## 2023-02-26 DIAGNOSIS — Z3041 Encounter for surveillance of contraceptive pills: Secondary | ICD-10-CM

## 2023-02-26 DIAGNOSIS — Z1231 Encounter for screening mammogram for malignant neoplasm of breast: Secondary | ICD-10-CM

## 2023-02-26 MED ORDER — LEVONORGESTREL-ETHINYL ESTRAD 0.1-20 MG-MCG PO TABS
1.0000 | ORAL_TABLET | Freq: Every day | ORAL | 0 refills | Status: DC
  Filled 2023-02-26: qty 28, 28d supply, fill #0

## 2023-02-26 NOTE — Telephone Encounter (Signed)
Alejandra Watkins called triage asking why her prescription was denied, I advised her it was time for an annual once she was scheduled I could send in one refill to hold her over until her appointment.

## 2023-03-25 ENCOUNTER — Ambulatory Visit
Admission: RE | Admit: 2023-03-25 | Discharge: 2023-03-25 | Disposition: A | Discharge status: Home / Self Care | Payer: 59 | Source: Ambulatory Visit | Attending: Obstetrics and Gynecology | Admitting: Obstetrics and Gynecology

## 2023-03-25 DIAGNOSIS — Z1231 Encounter for screening mammogram for malignant neoplasm of breast: Secondary | ICD-10-CM | POA: Insufficient documentation

## 2023-03-26 ENCOUNTER — Other Ambulatory Visit: Payer: Self-pay | Admitting: Obstetrics and Gynecology

## 2023-03-26 ENCOUNTER — Other Ambulatory Visit: Payer: Self-pay | Admitting: Internal Medicine

## 2023-03-26 ENCOUNTER — Other Ambulatory Visit: Payer: Self-pay

## 2023-03-26 DIAGNOSIS — Z3041 Encounter for surveillance of contraceptive pills: Secondary | ICD-10-CM

## 2023-03-27 ENCOUNTER — Other Ambulatory Visit: Payer: Self-pay

## 2023-03-27 MED ORDER — OZEMPIC (0.25 OR 0.5 MG/DOSE) 2 MG/3ML ~~LOC~~ SOPN
0.5000 mg | PEN_INJECTOR | SUBCUTANEOUS | 2 refills | Status: DC
  Filled 2023-03-27: qty 3, 28d supply, fill #0
  Filled 2023-05-06: qty 3, 28d supply, fill #1

## 2023-03-27 NOTE — Telephone Encounter (Signed)
Requested Prescriptions  Pending Prescriptions Disp Refills   Semaglutide,0.25 or 0.5MG /DOS, (OZEMPIC, 0.25 OR 0.5 MG/DOSE,) 2 MG/3ML SOPN 6 mL 2    Sig: Inject 0.5 mg into the skin once a week     Endocrinology:  Diabetes - GLP-1 Receptor Agonists - semaglutide Passed - 03/26/2023 10:43 AM      Passed - HBA1C in normal range and within 180 days    HbA1c, POC (prediabetic range)  Date Value Ref Range Status  01/25/2023 5.8 5.7 - 6.4 % Final         Passed - Cr in normal range and within 360 days    Creat  Date Value Ref Range Status  01/25/2023 0.98 0.50 - 0.99 mg/dL Final   Creatinine,U  Date Value Ref Range Status  07/09/2019 175.6 mg/dL Final   Creatinine, Urine  Date Value Ref Range Status  07/20/2022 181 20 - 275 mg/dL Final         Passed - Valid encounter within last 6 months    Recent Outpatient Visits           2 months ago Gastroesophageal reflux disease without esophagitis   [place] [facility] [place], Salvadore Oxford, NP   8 months ago Encounter for general adult medical examination with abnormal findings   [place] [facility] [place], [place] W, NP   1 year ago Type 2 diabetes mellitus without complication, without long-term current use of insulin [facility])   [place] [facility] [place], [place] W, NP   1 year ago Type 2 diabetes mellitus without complication, without long-term current use of insulin [facility])   [place] [facility] [place], Salvadore Oxford, NP   1 year ago Pure hypercholesterolemia   [place] [facility] [place], Salvadore Oxford, NP       Future Appointments             In 4 months Baity, Salvadore Oxford, NP [place] [facility], [facility]

## 2023-04-05 ENCOUNTER — Other Ambulatory Visit: Payer: Self-pay

## 2023-04-05 ENCOUNTER — Other Ambulatory Visit: Payer: Self-pay | Admitting: Obstetrics and Gynecology

## 2023-04-05 DIAGNOSIS — Z3041 Encounter for surveillance of contraceptive pills: Secondary | ICD-10-CM

## 2023-04-05 MED ORDER — LEVONORGESTREL-ETHINYL ESTRAD 0.1-20 MG-MCG PO TABS
1.0000 | ORAL_TABLET | Freq: Every day | ORAL | 0 refills | Status: DC
Start: 2023-04-05 — End: 2023-04-16
  Filled 2023-04-05: qty 28, 21d supply, fill #0

## 2023-04-15 NOTE — Progress Notes (Unsigned)
PCP:  Lorre Munroe, NP   No chief complaint on file.   HPI:      [name] is a 47 y.o. G3P3000 who LMP was No LMP recorded (lmp unknown). Patient has had a hysterectomy., presents today for her annual examination.  Her menses are absent due to lap supracx hyst 2013 due to leio. She does not have PMB. Pt on OCPs for hx of recurrent ovar cysts, started by [facility] a couple yrs ago. Pt is high risk for surgery. Breast tenderness resolved once changed to lower dose OCPs last yr.   Sex activity: single partner, contraception - status post hysterectomy. Has decreased libido and vaginal dryness, using lubricants with sx relief. Also notes vaginal dryness if not sexually active. Uses dial sens skin or oil of olay. No dryer sheets, wears cotton underwear, no thongs, no wipes. No other vag sx.  Last Pap: 01/26/21 Results were: no abnormalities /neg HPV DNA. Still has cx Hx of STDs: none  Last mammogram: 03/25/23  Results: no abnormalities, repeat in a 1 yr There is no FH of breast cancer. There is no FH of ovarian cancer. The patient does self-breast exams.  Tobacco use: The patient denies current or previous tobacco use. Alcohol use: social drinker No drug use.  Exercise: not active  Colonoscopy: 9/21 with polyp, repeat after 5 yrs.   She does get adequate calcium and Vitamin D in her diet.  Labs with PCP.   Past Medical History:  Diagnosis Date   Acid reflux    Anemia    Diabetes mellitus without complication (HCC)    Family history of colon cancer    History of chicken pox    Leiomyoma    Ovarian cyst    seen at [facility]    Past Surgical History:  Procedure Laterality Date   BREAST BIOPSY Left 15 yrs ago   needle biopsy. Benign   COLONOSCOPY WITH PROPOFOL N/A 09/09/2020   Procedure: COLONOSCOPY WITH PROPOFOL;  Surgeon: Pasty Spillers, MD;  Location: [facility] ENDOSCOPY;  Service: Endoscopy;  Laterality: N/A;   LAPAROSCOPIC SUPRACERVICAL HYSTERECTOMY  2013   OVARIAN CYST  REMOVAL Left    TUBAL LIGATION      Family History  Problem Relation Age of Onset   Hypertension Mother    Alcoholism Father    Hypertension Paternal Grandmother    Diabetes Paternal Grandmother        type 2   Colon cancer Other    Diabetes Other        type 2   Hypertension Other    Diabetes Other        type 2   Stroke Other    Lung cancer Paternal Uncle    Heart disease Neg Hx    Breast cancer Neg Hx     Social History   Socioeconomic History   Marital status: Married    Spouse name: Windy Fast   Number of children: 3   Years of education: Not on file   Highest education level: Not on file  Occupational History   Occupation: Personnel officer: [employer]  Tobacco Use   Smoking status: Never   Smokeless tobacco: Never  Vaping Use   Vaping Use: Never used  Substance and Sexual Activity   Alcohol use: Yes    Alcohol/week: 1.0 standard drink of alcohol    Types: 1 Glasses of wine per week    Comment: occasional   Drug use: No  Sexual activity: Yes    Birth control/protection: Surgical    Comment: Hysterectomy  Other Topics Concern   Not on file  Social History Narrative   Not on file   Social Determinants of Health   Financial Resource Strain: Not on file  Food Insecurity: Not on file  Transportation Needs: Not on file  Physical Activity: Not on file  Stress: Not on file  Social Connections: Not on file  Intimate Partner Violence: Not on file    No outpatient medications have been marked as taking for the 04/16/23 encounter (Appointment) with Zanyia Silbaugh, Ilona Sorrel, PA-C.     ROS:  Review of Systems  Constitutional:  Negative for fatigue, fever and unexpected weight change.  Respiratory:  Negative for cough, shortness of breath and wheezing.   Cardiovascular:  Negative for chest pain, palpitations and leg swelling.  Gastrointestinal:  Negative for blood in stool, constipation, diarrhea, nausea and vomiting.  Endocrine: Negative for  cold intolerance, heat intolerance and polyuria.  Genitourinary:  Negative for dyspareunia, dysuria, flank pain, frequency, genital sores, hematuria, menstrual problem, pelvic pain, urgency, vaginal bleeding, vaginal discharge and vaginal pain.  Musculoskeletal:  Negative for back pain, joint swelling and myalgias.  Skin:  Negative for rash.  Neurological:  Negative for dizziness, syncope, light-headedness, numbness and headaches.  Hematological:  Negative for adenopathy.  Psychiatric/Behavioral:  Negative for agitation, confusion, sleep disturbance and suicidal ideas. The patient is not nervous/anxious.      Objective: LMP  (LMP Unknown)    Physical Exam Constitutional:      Appearance: She is well-developed.  Genitourinary:     Vulva normal.     Right Labia: No rash, tenderness or lesions.    Left Labia: No tenderness, lesions or rash.    No vaginal discharge, erythema or tenderness.     No vaginal atrophy present.     Right Adnexa: not tender and no mass present.    Left Adnexa: not tender and no mass present.    No cervical friability or polyp.     Uterus is not enlarged or tender.     Uterus is absent.  Breasts:    Right: No mass, nipple discharge, skin change or tenderness.     Left: No mass, nipple discharge, skin change or tenderness.  Neck:     Thyroid: No thyromegaly.  Cardiovascular:     Rate and Rhythm: Normal rate and regular rhythm.     Heart sounds: Normal heart sounds. No murmur heard. Pulmonary:     Effort: Pulmonary effort is normal.     Breath sounds: Normal breath sounds.  Abdominal:     Palpations: Abdomen is soft.     Tenderness: There is no abdominal tenderness. There is no guarding or rebound.  Musculoskeletal:        General: Normal range of motion.     Cervical back: Normal range of motion.  Lymphadenopathy:     Cervical: No cervical adenopathy.  Neurological:     General: No focal deficit present.     Mental Status: She is alert and  oriented to person, place, and time.     Cranial Nerves: No cranial nerve deficit.  Skin:    General: Skin is warm and dry.  Psychiatric:        Mood and Affect: Mood normal.        Behavior: Behavior normal.        Thought Content: Thought content normal.        Judgment: Judgment  normal.  Vitals reviewed.     Assessment/Plan: Encounter for annual routine gynecological examination  Encounter for surveillance of contraceptive pills - Plan: levonorgestrel-ethinyl estradiol (ALESSE) 0.1-20 MG-MCG tablet; OCP RF for ovarian cysts. F/u prn.   History of ovarian cyst--no sx on OCPs. High risk for surgery.  Encounter for screening mammogram for malignant neoplasm of breast - Plan: MM 3D SCREEN BREAST BILATERAL; [facility]  Vaginal dryness--neg vag exam, on sufficient estrogen with OCPs. Question chem derm. Change to dove sens skin soap, can do coconut oil as moisturizer.  F/u prn.    No orders of the defined types were placed in this encounter.            GYN counsel breast self exam, mammography screening, adequate intake of calcium and vitamin D, diet and exercise     F/U  No follow-ups on file.  Axzel Rockhill B. Janyia Guion, PA-C 04/15/2023 2:02 PM

## 2023-04-16 ENCOUNTER — Encounter: Payer: Self-pay | Admitting: Obstetrics and Gynecology

## 2023-04-16 ENCOUNTER — Other Ambulatory Visit: Payer: Self-pay

## 2023-04-16 ENCOUNTER — Ambulatory Visit (INDEPENDENT_AMBULATORY_CARE_PROVIDER_SITE_OTHER): Payer: 59 | Admitting: Obstetrics and Gynecology

## 2023-04-16 VITALS — BP 110/80 | Ht 64.0 in | Wt 203.0 lb

## 2023-04-16 DIAGNOSIS — Z01419 Encounter for gynecological examination (general) (routine) without abnormal findings: Secondary | ICD-10-CM

## 2023-04-16 DIAGNOSIS — N898 Other specified noninflammatory disorders of vagina: Secondary | ICD-10-CM | POA: Diagnosis not present

## 2023-04-16 DIAGNOSIS — Z01411 Encounter for gynecological examination (general) (routine) with abnormal findings: Secondary | ICD-10-CM

## 2023-04-16 DIAGNOSIS — Z3041 Encounter for surveillance of contraceptive pills: Secondary | ICD-10-CM

## 2023-04-16 DIAGNOSIS — Z8742 Personal history of other diseases of the female genital tract: Secondary | ICD-10-CM

## 2023-04-16 MED ORDER — LEVONORGESTREL-ETHINYL ESTRAD 0.1-20 MG-MCG PO TABS
1.0000 | ORAL_TABLET | Freq: Every day | ORAL | 4 refills | Status: DC
Start: 2023-04-16 — End: 2024-04-27
  Filled 2023-04-16: qty 84, 63d supply, fill #0
  Filled 2023-07-17: qty 84, 63d supply, fill #1
  Filled 2023-10-11: qty 84, 63d supply, fill #2
  Filled 2024-02-18: qty 84, 63d supply, fill #3

## 2023-04-16 MED ORDER — ESTRADIOL 0.1 MG/GM VA CREA
TOPICAL_CREAM | VAGINAL | 1 refills | Status: DC
Start: 2023-04-16 — End: 2024-07-25
  Filled 2023-04-16: qty 42.5, 60d supply, fill #0
  Filled 2023-07-17: qty 42.5, 60d supply, fill #1

## 2023-04-16 NOTE — Patient Instructions (Signed)
I value your feedback and you entrusting us with your care. If you get a [facility] patient survey, I would appreciate you taking the time to let us know about your experience today. Thank you! ? ? ?

## 2023-04-22 ENCOUNTER — Other Ambulatory Visit: Payer: Self-pay

## 2023-04-23 ENCOUNTER — Other Ambulatory Visit: Payer: Self-pay

## 2023-06-10 ENCOUNTER — Encounter: Payer: Self-pay | Admitting: Internal Medicine

## 2023-06-11 NOTE — Telephone Encounter (Signed)
Please see the MyChart message reply(ies) for my assessment and plan.  ?  ?This patient gave consent for this Medical Advice Message and is aware that it may result in a bill to their insurance company, as well as the possibility of receiving a bill for a co-payment or deductible. They are an established patient, but are not seeking medical advice exclusively about a problem treated during an in person or video visit in the last seven days. I did not recommend an in person or video visit within seven days of my reply.  ?  ?I spent a total of 3 minutes cumulative time within 7 days through MyChart messaging. ? ?Eliyah Bazzi, NP  ? ?

## 2023-06-11 NOTE — Telephone Encounter (Signed)
Please see the MyChart message reply(ies) for my assessment and plan.    This patient gave consent for this Medical Advice Message and is aware that it may result in a bill to their insurance company, as well as the possibility of receiving a bill for a co-payment or deductible. They are an established patient, but are not seeking medical advice exclusively about a problem treated during an in person or video visit in the last seven days. I did not recommend an in person or video visit within seven days of my reply.    I spent a total of 1 minutes cumulative time within 7 days through MyChart messaging.  Ragnar Waas, NP   

## 2023-06-26 ENCOUNTER — Other Ambulatory Visit: Payer: Self-pay | Admitting: Oncology

## 2023-06-26 DIAGNOSIS — Z006 Encounter for examination for normal comparison and control in clinical research program: Secondary | ICD-10-CM

## 2023-07-17 ENCOUNTER — Other Ambulatory Visit: Payer: Self-pay

## 2023-07-17 ENCOUNTER — Other Ambulatory Visit: Payer: Self-pay | Admitting: Internal Medicine

## 2023-07-18 ENCOUNTER — Other Ambulatory Visit: Payer: Self-pay

## 2023-07-18 MED ORDER — PANTOPRAZOLE SODIUM 40 MG PO TBEC
DELAYED_RELEASE_TABLET | Freq: Every day | ORAL | 0 refills | Status: DC
  Filled 2023-07-18: qty 90, 90d supply, fill #0

## 2023-07-18 NOTE — Telephone Encounter (Signed)
Requested Prescriptions  Pending Prescriptions Disp Refills   pantoprazole (PROTONIX) 40 MG tablet 90 tablet 0    Sig: Take 1 tablet by mouth daily.     Gastroenterology: Proton Pump Inhibitors Passed - 07/17/2023 11:29 AM      Passed - Valid encounter within last 12 months    Recent Outpatient Visits           5 months ago Gastroesophageal reflux disease without esophagitis   [place] [place], Salvadore Oxford, NP   12 months ago Encounter for general adult medical examination with abnormal findings   [place] [facility] [place], [place], NP   1 year ago Type 2 diabetes mellitus without complication, without long-term current use of insulin [facility])   [place] [facility] [place], [place] W, NP   1 year ago Type 2 diabetes mellitus without complication, without long-term current use of insulin [facility])   [place] [facility] [place], Salvadore Oxford, NP   1 year ago Pure hypercholesterolemia   [place] [facility] [place], Salvadore Oxford, NP       Future Appointments             In 1 week Sampson Si, Salvadore Oxford, NP [place] [facility], [facility]

## 2023-07-19 ENCOUNTER — Other Ambulatory Visit
Admission: RE | Admit: 2023-07-19 | Discharge: 2023-07-19 | Disposition: A | Discharge status: Home / Self Care | Payer: 59 | Attending: Oncology | Admitting: Oncology

## 2023-07-19 DIAGNOSIS — Z006 Encounter for examination for normal comparison and control in clinical research program: Secondary | ICD-10-CM | POA: Insufficient documentation

## 2023-07-25 ENCOUNTER — Other Ambulatory Visit: Payer: Self-pay

## 2023-07-26 ENCOUNTER — Encounter: Payer: 59 | Admitting: Internal Medicine

## 2023-08-13 ENCOUNTER — Other Ambulatory Visit: Payer: Self-pay

## 2023-08-13 ENCOUNTER — Ambulatory Visit (INDEPENDENT_AMBULATORY_CARE_PROVIDER_SITE_OTHER): Payer: 59 | Admitting: Internal Medicine

## 2023-08-13 VITALS — BP 124/80 | HR 69 | Temp 95.8°F | Ht 64.0 in | Wt 210.0 lb

## 2023-08-13 DIAGNOSIS — Z0001 Encounter for general adult medical examination with abnormal findings: Secondary | ICD-10-CM

## 2023-08-13 DIAGNOSIS — E119 Type 2 diabetes mellitus without complications: Secondary | ICD-10-CM | POA: Diagnosis not present

## 2023-08-13 DIAGNOSIS — Z6836 Body mass index (BMI) 36.0-36.9, adult: Secondary | ICD-10-CM

## 2023-08-13 LAB — HELIX MOLECULAR SCREEN

## 2023-08-13 MED ORDER — TIRZEPATIDE 2.5 MG/0.5ML ~~LOC~~ SOAJ
2.5000 mg | SUBCUTANEOUS | 0 refills | Status: DC
  Filled 2023-08-13: qty 2, 28d supply, fill #0

## 2023-08-13 NOTE — Patient Instructions (Signed)

## 2023-08-13 NOTE — Progress Notes (Signed)
Subjective:    Patient ID: Alejandra Watkins, female    DOB: 10-03-1976, 47 y.o.   MRN: [id number]  HPI  Patient presents to clinic today for her annual exam.  Flu: 08/2022 Tetanus: unsure COVID: X 2 Pap smear: Hysterectomy Mammogram: 03/2023 Colon screening: 08/2020 Vision screening: annually Dentist: biannually  Diet: She does eat meat. She consumes fruits and veggies. She tries to avoid fried foods. She drinks mostly water, unsweet tea. Exercise: personal trainer 3 x week  Review of Systems     Past Medical History:  Diagnosis Date   Acid reflux    Anemia    Diabetes mellitus without complication (HCC)    Family history of colon cancer    History of chicken pox    Leiomyoma    Ovarian cyst    seen at [facility]    Current Outpatient Medications  Medication Sig Dispense Refill   azelastine (OPTIVAR) 0.05 % ophthalmic solution      Cholecalciferol (VITAMIN D3) 5000 units CAPS Take 1 capsule by mouth daily.     clotrimazole-betamethasone (LOTRISONE) cream Apply externally BID prn sx up to 2 wks 15 g 0   estradiol (ESTRACE VAGINAL) 0.1 MG/GM vaginal cream Insert 1 g vaginally nightly for 7 nights, then 1-2 times weekly as maintenance 42.5 g 1   glucose blood (BAYER CONTOUR NEXT TEST) test strip 1 each by Other route 2 (two) times daily as needed for other. Use as instructed 200 each 2   ketoconazole (NIZORAL) 2 % cream      levocetirizine (XYZAL) 5 MG tablet Take 1 tablet (5 mg total) by mouth daily. 90 tablet 2   levonorgestrel-ethinyl estradiol (ALESSE) 0.1-20 MG-MCG tablet Take 1 tablet by mouth daily. CONTINUOUS DOSING 84 tablet 4   montelukast (SINGULAIR) 10 MG tablet Take 1 tablet (10 mg total) by mouth daily. 90 tablet 2   pantoprazole (PROTONIX) 40 MG tablet Take 1 tablet by mouth daily. 90 tablet 0   Semaglutide,0.25 or 0.5MG /DOS, (OZEMPIC, 0.25 OR 0.5 MG/DOSE,) 2 MG/3ML SOPN Inject 0.5 mg into the skin once a week. 6 mL 2   triamcinolone (NASACORT ALLERGY 24HR) 55  MCG/ACT AERO nasal inhaler 1 spray in each nostril Nasally Once a day for 30 days     No current facility-administered medications for this visit.    Allergies  Allergen Reactions   No Known Allergies     Family History  Problem Relation Age of Onset   Hypertension Mother    Alcoholism Father    Hypertension Paternal Grandmother    Diabetes Paternal Grandmother        type 2   Colon cancer Other    Diabetes Other        type 2   Hypertension Other    Diabetes Other        type 2   Stroke Other    Lung cancer Paternal Uncle    Heart disease Neg Hx    Breast cancer Neg Hx     Social History   Socioeconomic History   Marital status: Married    Spouse name: Windy Fast   Number of children: 3   Years of education: Not on file   Highest education level: Master's degree (e.g., MA, MS, MEng, MEd, MSW, MBA)  Occupational History   Occupation: Personnel officer: [employer]  Tobacco Use   Smoking status: Never   Smokeless tobacco: Never  Vaping Use   Vaping status: Never Used  Substance  and Sexual Activity   Alcohol use: Yes    Alcohol/week: 1.0 standard drink of alcohol    Types: 1 Glasses of wine per week    Comment: occasional   Drug use: No   Sexual activity: Yes    Birth control/protection: Surgical    Comment: Hysterectomy  Other Topics Concern   Not on file  Social History Narrative   Not on file   Social Determinants of Health   Financial Resource Strain: Low Risk  (08/13/2023)   Overall Financial Resource Strain (CARDIA)    Difficulty of Paying Living Expenses: Not hard at all  Food Insecurity: No Food Insecurity (08/13/2023)   Hunger Vital Sign    Worried About Running Out of Food in the Last Year: Never true    Ran Out of Food in the Last Year: Never true  Transportation Needs: No Transportation Needs (08/13/2023)   PRAPARE - Administrator, Civil Service (Medical): No    Lack of Transportation (Non-Medical): No  Physical  Activity: Sufficiently Active (08/13/2023)   Exercise Vital Sign    Days of Exercise per Week: 3 days    Minutes of Exercise per Session: 50 min  Stress: Stress Concern Present (08/13/2023)   [employer] of Occupational Health - Occupational Stress Questionnaire    Feeling of Stress : To some extent  Social Connections: Moderately Isolated (08/13/2023)   Social Connection and Isolation Panel [NHANES]    Frequency of Communication with Friends and Family: More than three times a week    Frequency of Social Gatherings with Friends and Family: Once a week    Attends Religious Services: Never    Database administrator or Organizations: No    Attends Engineer, structural: Not on file    Marital Status: Married  Catering manager Violence: Not on file     Constitutional: Denies fever, malaise, fatigue, headache or abrupt weight changes.  HEENT: Denies eye pain, eye redness, ear pain, ringing in the ears, wax buildup, runny nose, nasal congestion, bloody nose, or sore throat. Respiratory: Denies difficulty breathing, shortness of breath, cough or sputum production.   Cardiovascular: Denies chest pain, chest tightness, palpitations or swelling in the hands or feet.  Gastrointestinal: Denies abdominal pain, bloating, constipation, diarrhea or blood in the stool.  GU: Denies urgency, frequency, pain with urination, burning sensation, blood in urine, odor or discharge. Musculoskeletal: Denies decrease in range of motion, difficulty with gait, muscle pain or joint pain and swelling.  Skin: Denies redness, rashes, lesions or ulcercations.  Neurological: Denies dizziness, difficulty with memory, difficulty with speech or problems with balance and coordination.  Psych: Denies anxiety, depression, SI/HI.  No other specific complaints in a complete review of systems (except as listed in HPI above).  Objective:   Physical Exam   BP 124/80 (BP Location: Left Arm, Patient Position:  Sitting, Cuff Size: Large)   Pulse 69   Temp (!) 95.8 F (35.4 C) (Temporal)   Ht 5\' 4"  (1.626 m)   Wt 210 lb (95.3 kg)   LMP  (LMP Unknown)   SpO2 100%   BMI 36.05 kg/m   Wt Readings from Last 3 Encounters:  04/16/23 203 lb (92.1 kg)  01/25/23 209 lb (94.8 kg)  07/20/22 224 lb (101.6 kg)    General: Appears her stated age, well developed, well nourished in NAD. Skin: Warm, dry and intact. No ulcerations noted. HEENT: Head: normal shape and size; Eyes: sclera white, no icterus, conjunctiva pink, PERRLA  and EOMs intact;  Neck:  Neck supple, trachea midline. No masses, lumps or thyromegaly present.  Cardiovascular: Normal rate and rhythm. S1,S2 noted.  No murmur, rubs or gallops noted. No JVD or BLE edema.  Pulmonary/Chest: Normal effort and positive vesicular breath sounds. No respiratory distress. No wheezes, rales or ronchi noted.  Abdomen:  Normal bowel sounds.  Musculoskeletal: Strength 5/5 BUE/BLE. No difficulty with gait.  Neurological: Alert and oriented. Cranial nerves II-XII grossly intact. Coordination normal.  Psychiatric: Mood and affect normal. Behavior is normal. Judgment and thought content normal.    BMET    Component Value Date/Time   NA 140 01/25/2023 1522   NA 141 01/07/2017 0926   NA 141 10/30/2014 1545   K 4.1 01/25/2023 1522   K 4.5 10/30/2014 1545   CL 108 01/25/2023 1522   CL 107 10/30/2014 1545   CO2 25 01/25/2023 1522   CO2 28 10/30/2014 1545   GLUCOSE 104 (H) 01/25/2023 1522   GLUCOSE 83 10/30/2014 1545   BUN 11 01/25/2023 1522   BUN 12 01/07/2017 0926   BUN 16 10/30/2014 1545   CREATININE 0.98 01/25/2023 1522   CALCIUM 8.8 01/25/2023 1522   CALCIUM 8.0 (L) 10/30/2014 1545   GFRNONAA 93 01/07/2017 0926   GFRNONAA 54 (L) 10/30/2014 1545   GFRNONAA >60 05/08/2012 1158   GFRAA 107 01/07/2017 0926   GFRAA >60 10/30/2014 1545   GFRAA >60 05/08/2012 1158    Lipid Panel     Component Value Date/Time   CHOL 195 01/25/2023 1522   CHOL 219  (H) 01/07/2017 0926   TRIG 77 01/25/2023 1522   HDL 42 (L) 01/25/2023 1522   HDL 53 01/07/2017 0926   CHOLHDL 4.6 01/25/2023 1522   VLDL 11.8 07/11/2020 0910   LDLCALC 136 (H) 01/25/2023 1522    CBC    Component Value Date/Time   WBC 5.9 01/25/2023 1522   RBC 5.11 (H) 01/25/2023 1522   HGB 11.6 (L) 01/25/2023 1522   HGB 10.9 (L) 01/07/2017 1518   HCT 37.1 01/25/2023 1522   HCT 37.2 01/07/2017 1518   PLT 356 01/25/2023 1522   PLT 409 (H) 01/07/2017 1518   MCV 72.6 (L) 01/25/2023 1522   MCV 70 (L) 01/07/2017 1518   MCV 72 (L) 10/30/2014 1545   MCH 22.7 (L) 01/25/2023 1522   MCHC 31.3 (L) 01/25/2023 1522   RDW 14.4 01/25/2023 1522   RDW 17.5 (H) 01/07/2017 1518   RDW 15.5 (H) 10/30/2014 1545   LYMPHSABS 2.9 01/07/2017 1518   LYMPHSABS 2.6 10/30/2014 1545   MONOABS 1.1 (H) 07/23/2016 2117   MONOABS 0.8 10/30/2014 1545   EOSABS 0.2 01/07/2017 1518   EOSABS 0.1 10/30/2014 1545   BASOSABS 0.0 01/07/2017 1518   BASOSABS 0.1 10/30/2014 1545    Hgb A1C Lab Results  Component Value Date   HGBA1C 5.8 01/25/2023           Assessment & Plan:   Preventative health maintenance:  Encouraged her to get a flu shot in the fall Tetanus declined Encouraged her to get her COVID booster She no longer needs to screen for cervical cancer Mammogram UTD Colon screening UTD Encouraged her to consume a balanced diet and exercise regimen Advised her to see an eye doctor and dentist annually We will check CBC, c-Met, lipid, A1c, urine microalbumin today  RTC in 6 months, follow-up chronic conditions Nicki Reaper, NP

## 2023-08-13 NOTE — Assessment & Plan Note (Signed)
Encourage diet and exercise for weight loss 

## 2023-08-14 LAB — COMPLETE METABOLIC PANEL WITH GFR
AG Ratio: 1.7 (calc) (ref 1.0–2.5)
ALT: 13 U/L (ref 6–29)
AST: 13 U/L (ref 10–35)
Albumin: 3.9 g/dL (ref 3.6–5.1)
Alkaline phosphatase (APISO): 68 U/L (ref 31–125)
BUN: 12 mg/dL (ref 7–25)
CO2: 26 mmol/L (ref 20–32)
Calcium: 9 mg/dL (ref 8.6–10.2)
Chloride: 107 mmol/L (ref 98–110)
Creat: 0.9 mg/dL (ref 0.50–0.99)
Globulin: 2.3 g/dL (ref 1.9–3.7)
Glucose, Bld: 98 mg/dL (ref 65–99)
Potassium: 4.5 mmol/L (ref 3.5–5.3)
Sodium: 141 mmol/L (ref 135–146)
Total Bilirubin: 0.4 mg/dL (ref 0.2–1.2)
Total Protein: 6.2 g/dL (ref 6.1–8.1)
eGFR: 79 mL/min/{1.73_m2} (ref >=60)

## 2023-08-14 LAB — CBC
HCT: 39.8 % (ref 35.0–45.0)
Hemoglobin: 11.9 g/dL (ref 11.7–15.5)
MCH: 22.5 pg — ABNORMAL LOW (ref 27.0–33.0)
MCHC: 29.9 g/dL — ABNORMAL LOW (ref 32.0–36.0)
MCV: 75.2 fL — ABNORMAL LOW (ref 80.0–100.0)
MPV: 9.3 fL (ref 7.5–12.5)
Platelets: 397 10*3/uL (ref 140–400)
RBC: 5.29 10*6/uL — ABNORMAL HIGH (ref 3.80–5.10)
RDW: 15.2 % — ABNORMAL HIGH (ref 11.0–15.0)
WBC: 6.2 10*3/uL (ref 3.8–10.8)

## 2023-08-14 LAB — LIPID PANEL
Cholesterol: 187 mg/dL (ref <200)
HDL: 47 mg/dL — ABNORMAL LOW (ref >=50)
LDL Cholesterol (Calc): 124 mg/dL — ABNORMAL HIGH
Non-HDL Cholesterol (Calc): 140 mg/dL — ABNORMAL HIGH (ref <130)
Total CHOL/HDL Ratio: 4 (calc) (ref <5.0)
Triglycerides: 70 mg/dL (ref <150)

## 2023-08-14 LAB — HEMOGLOBIN A1C
Hgb A1c MFr Bld: 5.9 %{Hb} — ABNORMAL HIGH (ref <5.7)
Mean Plasma Glucose: 123 mg/dL
eAG (mmol/L): 6.8 mmol/L

## 2023-08-14 LAB — MICROALBUMIN / CREATININE URINE RATIO
Creatinine, Urine: 243 mg/dL (ref 20–275)
Microalb Creat Ratio: 1 mg/g{creat} (ref <30)
Microalb, Ur: 0.2 mg/dL

## 2023-09-04 ENCOUNTER — Other Ambulatory Visit: Payer: Self-pay | Admitting: Internal Medicine

## 2023-09-05 ENCOUNTER — Other Ambulatory Visit: Payer: Self-pay

## 2023-09-05 ENCOUNTER — Other Ambulatory Visit: Payer: Self-pay | Admitting: Internal Medicine

## 2023-09-06 ENCOUNTER — Other Ambulatory Visit: Payer: Self-pay

## 2023-09-09 ENCOUNTER — Other Ambulatory Visit: Payer: Self-pay

## 2023-09-09 ENCOUNTER — Encounter: Payer: Self-pay | Admitting: Internal Medicine

## 2023-09-09 MED FILL — Tirzepatide Soln Auto-injector 2.5 MG/0.5ML: SUBCUTANEOUS | 28 days supply | Qty: 2 | Fill #0 | Status: AC

## 2023-09-09 NOTE — Telephone Encounter (Signed)
Requested medication (s) are due for refill today: yes  Requested medication (s) are on the active medication list: yes  Last refill:  08/13/23  Future visit scheduled: yes  Notes to clinic:  Medication not assigned to a protocol, review manually.      Requested Prescriptions  Pending Prescriptions Disp Refills   MOUNJARO 2.5 MG/0.5ML Pen [Pharmacy Med Name: tirzepatide [facility]) 2.5 MG/0.5ML Pen] 2 mL 0    Sig: Inject 2.5 mg into the skin once a week.     Off-Protocol Failed - 09/05/2023  6:01 PM      Failed - Medication not assigned to a protocol, review manually.      Passed - Valid encounter within last 12 months    Recent Outpatient Visits           3 weeks ago Encounter for general adult medical examination with abnormal findings   [place] [facility] [place], [place] W, NP   7 months ago Gastroesophageal reflux disease without esophagitis   [place] [place], Salvadore Oxford, NP   1 year ago Encounter for general adult medical examination with abnormal findings   [place] [facility] [place], [place], NP   1 year ago Type 2 diabetes mellitus without complication, without long-term current use of insulin [facility])   [place] [facility] [place], [place] W, NP   1 year ago Type 2 diabetes mellitus without complication, without long-term current use of insulin [facility])   [place] [facility] [place], Salvadore Oxford, NP       Future Appointments             In 5 months Baity, Salvadore Oxford, NP [place] [facility], [facility]

## 2023-09-10 ENCOUNTER — Other Ambulatory Visit: Payer: Self-pay

## 2023-09-12 ENCOUNTER — Other Ambulatory Visit: Payer: Self-pay

## 2023-09-12 DIAGNOSIS — J300 Vasomotor rhinitis: Secondary | ICD-10-CM | POA: Diagnosis not present

## 2023-09-12 DIAGNOSIS — H1045 Other chronic allergic conjunctivitis: Secondary | ICD-10-CM | POA: Diagnosis not present

## 2023-09-12 MED ORDER — AZELASTINE HCL 0.1 % NA SOLN
1.0000 | Freq: Two times a day (BID) | NASAL | 6 refills | Status: AC
  Filled 2023-09-12: qty 30, 30d supply, fill #0
  Filled 2023-10-11: qty 30, 30d supply, fill #1

## 2023-09-12 MED ORDER — AZELASTINE HCL 0.05 % OP SOLN
1.0000 [drp] | Freq: Two times a day (BID) | OPHTHALMIC | 2 refills | Status: AC
  Filled 2023-09-12: qty 18, 90d supply, fill #0

## 2023-09-13 ENCOUNTER — Other Ambulatory Visit: Payer: Self-pay

## 2023-10-07 ENCOUNTER — Other Ambulatory Visit: Payer: Self-pay

## 2023-10-07 MED FILL — Tirzepatide Soln Auto-injector 2.5 MG/0.5ML: SUBCUTANEOUS | 28 days supply | Qty: 2 | Fill #1 | Status: AC

## 2023-10-29 ENCOUNTER — Other Ambulatory Visit: Payer: Self-pay

## 2023-10-29 ENCOUNTER — Other Ambulatory Visit: Payer: Self-pay | Admitting: Internal Medicine

## 2023-10-29 MED ORDER — MONTELUKAST SODIUM 10 MG PO TABS
10.0000 mg | ORAL_TABLET | Freq: Every day | ORAL | 3 refills | Status: DC
  Filled 2023-10-29: qty 90, 90d supply, fill #0
  Filled 2024-02-20: qty 90, 90d supply, fill #1
  Filled 2024-06-10: qty 90, 90d supply, fill #2
  Filled 2024-09-13: qty 90, 90d supply, fill #3

## 2023-10-29 MED ORDER — LEVOCETIRIZINE DIHYDROCHLORIDE 5 MG PO TABS
5.0000 mg | ORAL_TABLET | Freq: Every day | ORAL | 3 refills | Status: DC
  Filled 2023-10-29: qty 90, 90d supply, fill #0
  Filled 2024-02-20: qty 90, 90d supply, fill #1
  Filled 2024-06-10: qty 90, 90d supply, fill #2
  Filled 2024-09-13: qty 90, 90d supply, fill #3

## 2023-10-30 ENCOUNTER — Other Ambulatory Visit: Payer: Self-pay

## 2023-10-30 MED FILL — Pantoprazole Sodium EC Tab 40 MG (Base Equiv): ORAL | 90 days supply | Qty: 90 | Fill #0 | Status: AC

## 2023-10-30 NOTE — Telephone Encounter (Signed)
Requested Prescriptions  Pending Prescriptions Disp Refills   pantoprazole (PROTONIX) 40 MG tablet 90 tablet 0    Sig: Take 1 tablet by mouth daily.     Gastroenterology: Proton Pump Inhibitors Passed - 10/29/2023  5:02 PM      Passed - Valid encounter within last 12 months    Recent Outpatient Visits           2 months ago Encounter for general adult medical examination with abnormal findings   [facility] [place], [place] W, NP   9 months ago Gastroesophageal reflux disease without esophagitis   [place] [facility] [place], Salvadore Oxford, NP   1 year ago Encounter for general adult medical examination with abnormal findings   [place] [facility] [place], [place], NP   1 year ago Type 2 diabetes mellitus without complication, without long-term current use of insulin [facility])   [place] [facility] [place], [place] W, NP   1 year ago Type 2 diabetes mellitus without complication, without long-term current use of insulin [facility])   [place] [facility] [place], Salvadore Oxford, NP       Future Appointments             In 3 months Baity, Salvadore Oxford, NP [place] [facility], [place]   In 7 months Onalee Hua Gregery Na, DO [facility]

## 2023-10-31 ENCOUNTER — Other Ambulatory Visit: Payer: Self-pay

## 2023-11-04 MED FILL — Tirzepatide Soln Auto-injector 2.5 MG/0.5ML: SUBCUTANEOUS | 28 days supply | Qty: 2 | Fill #2 | Status: AC

## 2023-11-13 DIAGNOSIS — Q142 Congenital malformation of optic disc: Secondary | ICD-10-CM | POA: Diagnosis not present

## 2023-11-13 DIAGNOSIS — E119 Type 2 diabetes mellitus without complications: Secondary | ICD-10-CM | POA: Diagnosis not present

## 2023-11-13 LAB — HM DIABETES EYE EXAM

## 2023-12-05 ENCOUNTER — Encounter: Payer: Self-pay | Admitting: Internal Medicine

## 2023-12-05 ENCOUNTER — Other Ambulatory Visit: Payer: Self-pay | Admitting: Internal Medicine

## 2023-12-05 ENCOUNTER — Other Ambulatory Visit: Payer: Self-pay

## 2023-12-05 MED ORDER — MOUNJARO 2.5 MG/0.5ML ~~LOC~~ SOAJ
2.5000 mg | SUBCUTANEOUS | 0 refills | Status: DC
  Filled 2023-12-05: qty 6, 84d supply, fill #0

## 2023-12-05 NOTE — Telephone Encounter (Signed)
Requested medication (s) are due for refill today - yes  Requested medication (s) are on the active medication list -yes  Future visit scheduled -yes  Last refill: 09/09/23 6ml  Notes to clinic: off protocol- provider review   Requested Prescriptions  Pending Prescriptions Disp Refills   MOUNJARO 2.5 MG/0.5ML Pen [Pharmacy Med Name: tirzepatide [facility]) 2.5 MG/0.5ML Pen] 6 mL 0    Sig: Inject 2.5 mg into the skin once a week.     Off-Protocol Failed - 12/05/2023 10:05 AM      Failed - Medication not assigned to a protocol, review manually.      Passed - Valid encounter within last 12 months    Recent Outpatient Visits           3 months ago Encounter for general adult medical examination with abnormal findings   [place] [facility] [place], [place], NP   10 months ago Gastroesophageal reflux disease without esophagitis   [place] [facility] [place], Salvadore Oxford, NP   1 year ago Encounter for general adult medical examination with abnormal findings   [place] [facility] [place], Salvadore Oxford, NP   1 year ago Type 2 diabetes mellitus without complication, without long-term current use of insulin [facility])   [place] [facility] [place], [place], NP   2 years ago Type 2 diabetes mellitus without complication, without long-term current use of insulin [facility])   [place] [facility] [place], Salvadore Oxford, NP       Future Appointments             In 2 months Baity, Salvadore [place], NP [facility], [place]   In 6 months Terri Piedra, DO [facility]               Requested Prescriptions  Pending Prescriptions Disp Refills   MOUNJARO 2.5 MG/0.5ML Pen [Pharmacy Med Name: tirzepatide [facility]) 2.5 MG/0.5ML Pen] 6 mL 0    Sig: Inject 2.5 mg into the skin once a week.     Off-Protocol Failed - 12/05/2023 10:05 AM      Failed - Medication not assigned to a protocol,  review manually.      Passed - Valid encounter within last 12 months    Recent Outpatient Visits           3 months ago Encounter for general adult medical examination with abnormal findings   [place] [facility] [place], [place], NP   10 months ago Gastroesophageal reflux disease without esophagitis   [place] [facility] [place], Salvadore Oxford, NP   1 year ago Encounter for general adult medical examination with abnormal findings   [place] [facility] [place], [place], NP   1 year ago Type 2 diabetes mellitus without complication, without long-term current use of insulin [facility])   [place] [facility] [place], [place], NP   2 years ago Type 2 diabetes mellitus without complication, without long-term current use of insulin [facility])   [place] [facility] [place], Salvadore Oxford, NP       Future Appointments             In 2 months Baity, Salvadore Oxford, NP  [facility], [place]   In 6 months Terri Piedra, DO [facility]

## 2024-01-02 DIAGNOSIS — L648 Other androgenic alopecia: Secondary | ICD-10-CM | POA: Diagnosis not present

## 2024-01-11 DIAGNOSIS — F4320 Adjustment disorder, unspecified: Secondary | ICD-10-CM | POA: Diagnosis not present

## 2024-02-08 DIAGNOSIS — F419 Anxiety disorder, unspecified: Secondary | ICD-10-CM | POA: Diagnosis not present

## 2024-02-10 ENCOUNTER — Other Ambulatory Visit: Payer: Self-pay | Admitting: Obstetrics and Gynecology

## 2024-02-10 DIAGNOSIS — Z1231 Encounter for screening mammogram for malignant neoplasm of breast: Secondary | ICD-10-CM

## 2024-02-17 ENCOUNTER — Ambulatory Visit (INDEPENDENT_AMBULATORY_CARE_PROVIDER_SITE_OTHER): Payer: 59 | Admitting: Internal Medicine

## 2024-02-17 ENCOUNTER — Other Ambulatory Visit: Payer: Self-pay

## 2024-02-17 ENCOUNTER — Encounter: Payer: Self-pay | Admitting: Internal Medicine

## 2024-02-17 VITALS — BP 128/84 | Ht 64.0 in | Wt 207.4 lb

## 2024-02-17 DIAGNOSIS — E66812 Obesity, class 2: Secondary | ICD-10-CM | POA: Diagnosis not present

## 2024-02-17 DIAGNOSIS — E1169 Type 2 diabetes mellitus with other specified complication: Secondary | ICD-10-CM

## 2024-02-17 DIAGNOSIS — Z7985 Long-term (current) use of injectable non-insulin antidiabetic drugs: Secondary | ICD-10-CM

## 2024-02-17 DIAGNOSIS — E785 Hyperlipidemia, unspecified: Secondary | ICD-10-CM | POA: Diagnosis not present

## 2024-02-17 DIAGNOSIS — E119 Type 2 diabetes mellitus without complications: Secondary | ICD-10-CM

## 2024-02-17 DIAGNOSIS — K219 Gastro-esophageal reflux disease without esophagitis: Secondary | ICD-10-CM

## 2024-02-17 DIAGNOSIS — Z6835 Body mass index (BMI) 35.0-35.9, adult: Secondary | ICD-10-CM | POA: Diagnosis not present

## 2024-02-17 DIAGNOSIS — D500 Iron deficiency anemia secondary to blood loss (chronic): Secondary | ICD-10-CM

## 2024-02-17 MED ORDER — PANTOPRAZOLE SODIUM 20 MG PO TBEC
20.0000 mg | DELAYED_RELEASE_TABLET | Freq: Every day | ORAL | 1 refills | Status: DC
  Filled 2024-02-17: qty 90, 90d supply, fill #0
  Filled 2024-06-10: qty 90, 90d supply, fill #1

## 2024-02-17 MED ORDER — MOUNJARO 2.5 MG/0.5ML ~~LOC~~ SOAJ
2.5000 mg | SUBCUTANEOUS | 1 refills | Status: DC
  Filled 2024-02-17: qty 6, 84d supply, fill #0
  Filled 2024-05-15: qty 6, 84d supply, fill #1

## 2024-02-17 NOTE — Assessment & Plan Note (Signed)
 A1c today Urine microalbumin has been checked within the last year Encourage low-carb diet and exercise for weight loss Continue Mounjaro Encourage routine eye exam Encouraged routine foot exam Flu shot UTD She declines Pneumovax Encouraged her to get her COVID booster

## 2024-02-17 NOTE — Assessment & Plan Note (Addendum)
 Avoid foods that trigger reflux Encourage weight loss as this can produce reflux symptoms Will decrease pantoprazole to 20 mg daily

## 2024-02-17 NOTE — Patient Instructions (Signed)

## 2024-02-17 NOTE — Assessment & Plan Note (Signed)
 C-Met and lipid profile today Discussed LDL goal of <70 Will consider statin therapy if LDL not at goal Encouraged her to consume low-fat diet

## 2024-02-17 NOTE — Progress Notes (Signed)
 Subjective:    Patient ID: Alejandra Watkins, female    DOB: 1975/12/23, 48 y.o.   MRN: [id number]  HPI  Patient presents to clinic today for follow-up of chronic conditions.  DM2: Her last A1c was 5.9%, 07/2023.  She is taking mounjaro as prescribed.  She is not checking her sugars routinely. She checks her feet routinely.  Her last eye exam was 10/2023.  Flu 08/2023.  Pneumovax never.  COVID Pfizer x 2.  HLD: Her last LDL was 124, triglycerides 70, 07/2023.  She is not currently taking any cholesterol-lowering medication at this time.  She tries to consume low-fat diet.  GERD: Triggered by greasy, fried foods.  She denies breakthrough on pantoprazole.  There is no upper GI on file.  Iron deficiency anemia: Her last H/H was 11.9/39.8, 07/2023.  She is not taking any oral iron as prescribed.  She does not follow with hematology.  Environmental allergies: Managed with astelin, optivar, xyzal and singulair.  She follows with an allergist.  Review of Systems     Past Medical History:  Diagnosis Date   Acid reflux    Anemia    Diabetes mellitus without complication (HCC)    Family history of colon cancer    History of chicken pox    Leiomyoma    Ovarian cyst    seen at [facility]    Current Outpatient Medications  Medication Sig Dispense Refill   azelastine (ASTELIN) 0.1 % nasal spray Place 1-2 sprays into both nostrils 2 (two) times daily. 30 mL 6   azelastine (OPTIVAR) 0.05 % ophthalmic solution      azelastine (OPTIVAR) 0.05 % ophthalmic solution Place 1 drop into affected eye(s) 2 (two) times daily. 18 mL 2   Cholecalciferol (VITAMIN D3) 5000 units CAPS Take 1 capsule by mouth daily.     clotrimazole-betamethasone (LOTRISONE) cream Apply externally BID prn sx up to 2 wks 15 g 0   estradiol (ESTRACE VAGINAL) 0.1 MG/GM vaginal cream Insert 1 g vaginally nightly for 7 nights, then 1-2 times weekly as maintenance 42.5 g 1   glucose blood (BAYER CONTOUR NEXT TEST) test strip 1 each by Other  route 2 (two) times daily as needed for other. Use as instructed 200 each 2   ketoconazole (NIZORAL) 2 % cream      levocetirizine (XYZAL) 5 MG tablet Take 1 tablet (5 mg total) by mouth daily. 90 tablet 3   levonorgestrel-ethinyl estradiol (ALESSE) 0.1-20 MG-MCG tablet Take 1 tablet by mouth daily. CONTINUOUS DOSING 84 tablet 4   montelukast (SINGULAIR) 10 MG tablet Take 1 tablet (10 mg total) by mouth daily. 90 tablet 3   pantoprazole (PROTONIX) 40 MG tablet Take 1 tablet (40 mg total) by mouth daily. 90 tablet 0   tirzepatide (MOUNJARO) 2.5 MG/0.5ML Pen Inject 2.5 mg into the skin once a week. 6 mL 0   triamcinolone (NASACORT ALLERGY 24HR) 55 MCG/ACT AERO nasal inhaler 1 spray in each nostril Nasally Once a day for 30 days     No current facility-administered medications for this visit.    Allergies  Allergen Reactions   No Known Allergies     Family History  Problem Relation Age of Onset   Hypertension Mother    Alcoholism Father    Hypertension Paternal Grandmother    Diabetes Paternal Grandmother        type 2   Lung cancer Paternal Uncle    Colon cancer Other    Diabetes Other  type 2   Hypertension Other    Diabetes Other        type 2   Stroke Other    Heart disease Neg Hx    Breast cancer Neg Hx     Social History   Socioeconomic History   Marital status: Married    Spouse name: Windy Fast   Number of children: 3   Years of education: Not on file   Highest education level: Master's degree (e.g., MA, MS, MEng, MEd, MSW, MBA)  Occupational History   Occupation: Personnel officer: [employer]  Tobacco Use   Smoking status: Never   Smokeless tobacco: Never  Vaping Use   Vaping status: Never Used  Substance and Sexual Activity   Alcohol use: Yes    Alcohol/week: 1.0 standard drink of alcohol    Types: 1 Glasses of wine per week    Comment: occasional   Drug use: No   Sexual activity: Yes    Birth control/protection: Surgical     Comment: Hysterectomy  Other Topics Concern   Not on file  Social History Narrative   Not on file   Social Drivers of Health   Financial Resource Strain: Low Risk  (08/13/2023)   Overall Financial Resource Strain (CARDIA)    Difficulty of Paying Living Expenses: Not hard at all  Food Insecurity: No Food Insecurity (08/13/2023)   Hunger Vital Sign    Worried About Running Out of Food in the Last Year: Never true    Ran Out of Food in the Last Year: Never true  Transportation Needs: No Transportation Needs (08/13/2023)   PRAPARE - Administrator, Civil Service (Medical): No    Lack of Transportation (Non-Medical): No  Physical Activity: Sufficiently Active (08/13/2023)   Exercise Vital Sign    Days of Exercise per Week: 3 days    Minutes of Exercise per Session: 50 min  Stress: Stress Concern Present (08/13/2023)   [employer] of Occupational Health - Occupational Stress Questionnaire    Feeling of Stress : To some extent  Social Connections: Moderately Isolated (08/13/2023)   Social Connection and Isolation Panel [NHANES]    Frequency of Communication with Friends and Family: More than three times a week    Frequency of Social Gatherings with Friends and Family: Once a week    Attends Religious Services: Never    Database administrator or Organizations: No    Attends Engineer, structural: Not on file    Marital Status: Married  Catering manager Violence: Not on file     Constitutional: Denies fever, malaise, fatigue, headache or abrupt weight changes.  HEENT: Denies eye pain, eye redness, ear pain, ringing in the ears, wax buildup, runny nose, nasal congestion, bloody nose, or sore throat. Respiratory: Denies difficulty breathing, shortness of breath, cough or sputum production.   Cardiovascular: Denies chest pain, chest tightness, palpitations or swelling in the hands or feet.  Gastrointestinal: Denies abdominal pain, bloating, constipation, diarrhea or  blood in the stool.  GU: Denies urgency, frequency, pain with urination, burning sensation, blood in urine, odor or discharge. Musculoskeletal: Denies decrease in range of motion, difficulty with gait, muscle pain or joint pain and swelling.  Skin: Denies redness, rashes, lesions or ulcercations.  Neurological: Denies dizziness, difficulty with memory, difficulty with speech or problems with balance and coordination.  Psych: Denies anxiety, depression, SI/HI.  No other specific complaints in a complete review of systems (except as listed in HPI  above).  Objective:   Physical Exam  BP 128/84 (BP Location: Left Arm, Patient Position: Sitting, Cuff Size: Normal)   Ht 5\' 4"  (1.626 m)   Wt 207 lb 6.4 oz (94.1 kg)   LMP  (LMP Unknown)   BMI 35.60 kg/m    Wt Readings from Last 3 Encounters:  08/13/23 210 lb (95.3 kg)  04/16/23 203 lb (92.1 kg)  01/25/23 209 lb (94.8 kg)    General: Appears her stated age, obese in NAD. Skin: Warm, dry and intact. No  ulcerations noted. HEENT: Head: normal shape and size; Eyes: sclera white, no icterus, conjunctiva pink, PERRLA and EOMs intact;  Cardiovascular: Normal rate and rhythm. S1,S2 noted.  No murmur, rubs or gallops noted. No JVD or BLE edema.  Pulmonary/Chest: Normal effort and positive vesicular breath sounds. No respiratory distress. No wheezes, rales or ronchi noted.  Abdomen: Soft and nontender.  Normal bowel sounds. Musculoskeletal:  No difficulty with gait.  Neurological: Alert and oriented.Coordination normal.  Psychiatric: Mood and affect normal. Behavior is normal. Judgment and thought content normal.     BMET    Component Value Date/Time   NA 141 08/13/2023 0819   NA 141 01/07/2017 0926   NA 141 10/30/2014 1545   K 4.5 08/13/2023 0819   K 4.5 10/30/2014 1545   CL 107 08/13/2023 0819   CL 107 10/30/2014 1545   CO2 26 08/13/2023 0819   CO2 28 10/30/2014 1545   GLUCOSE 98 08/13/2023 0819   GLUCOSE 83 10/30/2014 1545   BUN  12 08/13/2023 0819   BUN 12 01/07/2017 0926   BUN 16 10/30/2014 1545   CREATININE 0.90 08/13/2023 0819   CALCIUM 9.0 08/13/2023 0819   CALCIUM 8.0 (L) 10/30/2014 1545   GFRNONAA 93 01/07/2017 0926   GFRNONAA 54 (L) 10/30/2014 1545   GFRNONAA >60 05/08/2012 1158   GFRAA 107 01/07/2017 0926   GFRAA >60 10/30/2014 1545   GFRAA >60 05/08/2012 1158    Lipid Panel     Component Value Date/Time   CHOL 187 08/13/2023 0819   CHOL 219 (H) 01/07/2017 0926   TRIG 70 08/13/2023 0819   HDL 47 (L) 08/13/2023 0819   HDL 53 01/07/2017 0926   CHOLHDL 4.0 08/13/2023 0819   VLDL 11.8 07/11/2020 0910   LDLCALC 124 (H) 08/13/2023 0819    CBC    Component Value Date/Time   WBC 6.2 08/13/2023 0819   RBC 5.29 (H) 08/13/2023 0819   HGB 11.9 08/13/2023 0819   HGB 10.9 (L) 01/07/2017 1518   HCT 39.8 08/13/2023 0819   HCT 37.2 01/07/2017 1518   PLT 397 08/13/2023 0819   PLT 409 (H) 01/07/2017 1518   MCV 75.2 (L) 08/13/2023 0819   MCV 70 (L) 01/07/2017 1518   MCV 72 (L) 10/30/2014 1545   MCH 22.5 (L) 08/13/2023 0819   MCHC 29.9 (L) 08/13/2023 0819   RDW 15.2 (H) 08/13/2023 0819   RDW 17.5 (H) 01/07/2017 1518   RDW 15.5 (H) 10/30/2014 1545   LYMPHSABS 2.9 01/07/2017 1518   LYMPHSABS 2.6 10/30/2014 1545   MONOABS 1.1 (H) 07/23/2016 2117   MONOABS 0.8 10/30/2014 1545   EOSABS 0.2 01/07/2017 1518   EOSABS 0.1 10/30/2014 1545   BASOSABS 0.0 01/07/2017 1518   BASOSABS 0.1 10/30/2014 1545    Hgb A1C Lab Results  Component Value Date   HGBA1C 5.9 (H) 08/13/2023          Assessment & Plan:      RTC  in 6 months for your annual exam Nicki Reaper, NP

## 2024-02-17 NOTE — Assessment & Plan Note (Signed)
 CBC today.

## 2024-02-17 NOTE — Assessment & Plan Note (Signed)
 Encourage diet and exercise for weight loss

## 2024-02-18 ENCOUNTER — Other Ambulatory Visit: Payer: Self-pay

## 2024-02-18 ENCOUNTER — Encounter: Payer: Self-pay | Admitting: Internal Medicine

## 2024-02-18 LAB — LIPID PANEL
Cholesterol: 204 mg/dL — ABNORMAL HIGH (ref <200)
HDL: 52 mg/dL (ref >=50)
LDL Cholesterol (Calc): 133 mg/dL — ABNORMAL HIGH
Non-HDL Cholesterol (Calc): 152 mg/dL — ABNORMAL HIGH (ref <130)
Total CHOL/HDL Ratio: 3.9 (calc) (ref <5.0)
Triglycerides: 91 mg/dL (ref <150)

## 2024-02-18 LAB — COMPLETE METABOLIC PANEL WITH GFR
AG Ratio: 1.9 (calc) (ref 1.0–2.5)
ALT: 23 U/L (ref 6–29)
AST: 21 U/L (ref 10–35)
Albumin: 4.1 g/dL (ref 3.6–5.1)
Alkaline phosphatase (APISO): 78 U/L (ref 31–125)
BUN: 12 mg/dL (ref 7–25)
CO2: 28 mmol/L (ref 20–32)
Calcium: 9.2 mg/dL (ref 8.6–10.2)
Chloride: 105 mmol/L (ref 98–110)
Creat: 0.8 mg/dL (ref 0.50–0.99)
Globulin: 2.2 g/dL (ref 1.9–3.7)
Glucose, Bld: 87 mg/dL (ref 65–99)
Potassium: 4 mmol/L (ref 3.5–5.3)
Sodium: 140 mmol/L (ref 135–146)
Total Bilirubin: 0.3 mg/dL (ref 0.2–1.2)
Total Protein: 6.3 g/dL (ref 6.1–8.1)
eGFR: 90 mL/min/{1.73_m2} (ref >=60)

## 2024-02-18 LAB — HEMOGLOBIN A1C
Hgb A1c MFr Bld: 5.6 %{Hb} (ref <5.7)
Mean Plasma Glucose: 114 mg/dL
eAG (mmol/L): 6.3 mmol/L

## 2024-02-18 LAB — CBC
HCT: 39.2 % (ref 35.0–45.0)
Hemoglobin: 12 g/dL (ref 11.7–15.5)
MCH: 22.5 pg — ABNORMAL LOW (ref 27.0–33.0)
MCHC: 30.6 g/dL — ABNORMAL LOW (ref 32.0–36.0)
MCV: 73.5 fL — ABNORMAL LOW (ref 80.0–100.0)
MPV: 9.4 fL (ref 7.5–12.5)
Platelets: 378 10*3/uL (ref 140–400)
RBC: 5.33 10*6/uL — ABNORMAL HIGH (ref 3.80–5.10)
RDW: 15.2 % — ABNORMAL HIGH (ref 11.0–15.0)
WBC: 6.1 10*3/uL (ref 3.8–10.8)

## 2024-03-04 DIAGNOSIS — F419 Anxiety disorder, unspecified: Secondary | ICD-10-CM | POA: Diagnosis not present

## 2024-03-25 ENCOUNTER — Ambulatory Visit
Admission: RE | Admit: 2024-03-25 | Discharge: 2024-03-25 | Disposition: A | Discharge status: Home / Self Care | Payer: Commercial Managed Care - PPO | Source: Ambulatory Visit | Attending: Obstetrics and Gynecology | Admitting: Obstetrics and Gynecology

## 2024-03-25 DIAGNOSIS — Z1231 Encounter for screening mammogram for malignant neoplasm of breast: Secondary | ICD-10-CM | POA: Insufficient documentation

## 2024-03-28 ENCOUNTER — Encounter: Payer: Self-pay | Admitting: Obstetrics and Gynecology

## 2024-04-11 DIAGNOSIS — F411 Generalized anxiety disorder: Secondary | ICD-10-CM | POA: Diagnosis not present

## 2024-04-27 ENCOUNTER — Other Ambulatory Visit: Payer: Self-pay

## 2024-04-27 ENCOUNTER — Ambulatory Visit (INDEPENDENT_AMBULATORY_CARE_PROVIDER_SITE_OTHER): Payer: Commercial Managed Care - PPO | Admitting: Obstetrics and Gynecology

## 2024-04-27 ENCOUNTER — Encounter: Payer: Self-pay | Admitting: Obstetrics and Gynecology

## 2024-04-27 ENCOUNTER — Other Ambulatory Visit (HOSPITAL_COMMUNITY)
Admission: RE | Admit: 2024-04-27 | Discharge: 2024-04-27 | Disposition: A | Discharge status: Home / Self Care | Source: Ambulatory Visit | Attending: Obstetrics and Gynecology | Admitting: Obstetrics and Gynecology

## 2024-04-27 VITALS — BP 105/68 | HR 71 | Ht 64.0 in | Wt 215.0 lb

## 2024-04-27 DIAGNOSIS — Z1231 Encounter for screening mammogram for malignant neoplasm of breast: Secondary | ICD-10-CM

## 2024-04-27 DIAGNOSIS — Z01419 Encounter for gynecological examination (general) (routine) without abnormal findings: Secondary | ICD-10-CM | POA: Diagnosis not present

## 2024-04-27 DIAGNOSIS — Z1151 Encounter for screening for human papillomavirus (HPV): Secondary | ICD-10-CM

## 2024-04-27 DIAGNOSIS — N9089 Other specified noninflammatory disorders of vulva and perineum: Secondary | ICD-10-CM

## 2024-04-27 DIAGNOSIS — Z124 Encounter for screening for malignant neoplasm of cervix: Secondary | ICD-10-CM | POA: Diagnosis not present

## 2024-04-27 DIAGNOSIS — Z8742 Personal history of other diseases of the female genital tract: Secondary | ICD-10-CM

## 2024-04-27 DIAGNOSIS — Z3041 Encounter for surveillance of contraceptive pills: Secondary | ICD-10-CM

## 2024-04-27 DIAGNOSIS — N898 Other specified noninflammatory disorders of vagina: Secondary | ICD-10-CM

## 2024-04-27 MED ORDER — CLOTRIMAZOLE-BETAMETHASONE 1-0.05 % EX CREA
TOPICAL_CREAM | CUTANEOUS | 0 refills | Status: AC
  Filled 2024-04-27: qty 30, 14d supply, fill #0

## 2024-04-27 MED ORDER — LEVONORGESTREL-ETHINYL ESTRAD 0.1-20 MG-MCG PO TABS
1.0000 | ORAL_TABLET | Freq: Every day | ORAL | 4 refills | Status: AC
  Filled 2024-04-27: qty 84, 63d supply, fill #0
  Filled 2024-07-25: qty 84, 63d supply, fill #1
  Filled 2024-11-09: qty 84, 63d supply, fill #2

## 2024-04-27 NOTE — Progress Notes (Signed)
 PCP:  Carollynn Cirri, NP   Chief Complaint  Patient presents with   Gynecologic Exam    No concerns    HPI:      [name] is a 48 y.o. G3P3000 who LMP was No LMP recorded (lmp unknown). Patient has had a hysterectomy., presents today for her annual examination.  Her menses are absent due to lap supracx hyst 2013 due to leio. She does not have PMB. Pt on cont dosing OCPs for hx of recurrent ovar cysts, started by [facility] a couple yrs ago. Pt is high risk for surgery. No hx of HTN. Has occas VS sx.   Sex activity: single partner, contraception - status post hysterectomy. Has vaginal dryness, improved with lubricants but doesn't use them frequently. Started on vag ERT last yr but didn't notice much improvement so stopped it. Wasn't using lubricants with it.   Last Pap: 01/26/21 Results were: no abnormalities /neg HPV DNA. Still has cx Hx of STDs: none  Last mammogram: 03/25/24  Results: no abnormalities, repeat in a 1 yr There is no FH of breast cancer. There is no FH of ovarian cancer. The patient does self-breast exams.  Tobacco use: The patient denies current or previous tobacco use. Alcohol use: none No drug use.  Exercise: mod active  Colonoscopy: 9/21 with polyp, repeat after 5 yrs.   She does get adequate calcium  and Vitamin D  in her diet.  Labs with PCP. Gets occas itching inguinal/vulvar area; treats with lotrisone  crm a few times with sx relief. Needs Rx RF.    Past Medical History:  Diagnosis Date   Acid reflux    Anemia    Diabetes mellitus without complication (HCC)    Family history of colon cancer    History of chicken pox    Leiomyoma    Ovarian cyst    seen at [facility]    Past Surgical History:  Procedure Laterality Date   BREAST BIOPSY Left 15 yrs ago   needle biopsy. Benign   COLONOSCOPY WITH PROPOFOL  N/A 09/09/2020   Procedure: COLONOSCOPY WITH PROPOFOL ;  Surgeon: Irby Mannan, MD;  Location: [facility] ENDOSCOPY;  Service: Endoscopy;   Laterality: N/A;   LAPAROSCOPIC SUPRACERVICAL HYSTERECTOMY  2013   OVARIAN CYST REMOVAL Left    TUBAL LIGATION      Family History  Problem Relation Age of Onset   Hypertension Mother    Alcoholism Father    Hypertension Paternal Grandmother    Diabetes Paternal Grandmother        type 2   Lung cancer Paternal Uncle    Colon cancer Other    Diabetes Other        type 2   Hypertension Other    Diabetes Other        type 2   Stroke Other    Heart disease Neg Hx    Breast cancer Neg Hx     Social History   Socioeconomic History   Marital status: Married    Spouse name: Currie Douse   Number of children: 3   Years of education: Not on file   Highest education level: Master's degree (e.g., MA, MS, MEng, MEd, MSW, MBA)  Occupational History   Occupation: Personnel officer: [employer]  Tobacco Use   Smoking status: Never   Smokeless tobacco: Never  Vaping Use   Vaping status: Never Used  Substance and Sexual Activity   Alcohol use: Yes    Alcohol/week: 1.0  standard drink of alcohol    Types: 1 Glasses of wine per week    Comment: occasional   Drug use: No   Sexual activity: Yes    Birth control/protection: Surgical    Comment: Hysterectomy  Other Topics Concern   Not on file  Social History Narrative   Not on file   Social Drivers of Health   Financial Resource Strain: Low Risk  (08/13/2023)   Overall Financial Resource Strain (CARDIA)    Difficulty of Paying Living Expenses: Not hard at all  Food Insecurity: No Food Insecurity (08/13/2023)   Hunger Vital Sign    Worried About Running Out of Food in the Last Year: Never true    Ran Out of Food in the Last Year: Never true  Transportation Needs: No Transportation Needs (08/13/2023)   PRAPARE - Administrator, Civil Service (Medical): No    Lack of Transportation (Non-Medical): No  Physical Activity: Sufficiently Active (08/13/2023)   Exercise Vital Sign    Days of Exercise per Week: 3  days    Minutes of Exercise per Session: 50 min  Stress: Stress Concern Present (08/13/2023)   [employer] of Occupational Health - Occupational Stress Questionnaire    Feeling of Stress : To some extent  Social Connections: Moderately Isolated (08/13/2023)   Social Connection and Isolation Panel [NHANES]    Frequency of Communication with Friends and Family: More than three times a week    Frequency of Social Gatherings with Friends and Family: Once a week    Attends Religious Services: Never    Database administrator or Organizations: No    Attends Engineer, structural: Not on file    Marital Status: Married  Catering manager Violence: Not on file    Current Meds  Medication Sig   azelastine  (ASTELIN ) 0.1 % nasal spray Place 1-2 sprays into both nostrils 2 (two) times daily.   azelastine  (OPTIVAR ) 0.05 % ophthalmic solution Place 1 drop into affected eye(s) 2 (two) times daily.   Cholecalciferol (VITAMIN D3) 5000 units CAPS Take 1 capsule by mouth daily.   clotrimazole -betamethasone  (LOTRISONE ) cream Apply externally BID prn sx up to 2 wks   estradiol  (ESTRACE  VAGINAL) 0.1 MG/GM vaginal cream Insert 1 g vaginally nightly for 7 nights, then 1-2 times weekly as maintenance   glucose blood (BAYER CONTOUR NEXT TEST) test strip 1 each by Other route 2 (two) times daily as needed for other. Use as instructed   levocetirizine (XYZAL ) 5 MG tablet Take 1 tablet (5 mg total) by mouth daily.   montelukast  (SINGULAIR ) 10 MG tablet Take 1 tablet (10 mg total) by mouth daily.   pantoprazole  (PROTONIX ) 20 MG tablet Take 1 tablet (20 mg total) by mouth daily.   tirzepatide  (MOUNJARO ) 2.5 MG/0.5ML Pen Inject 2.5 mg into the skin once a week.   triamcinolone (NASACORT ALLERGY 24HR) 55 MCG/ACT AERO nasal inhaler 1 spray in each nostril Nasally Once a day for 30 days   [DISCONTINUED] levonorgestrel -ethinyl estradiol  (ALESSE) 0.1-20 MG-MCG tablet Take 1 tablet by mouth daily. CONTINUOUS DOSING      ROS:  Review of Systems  Constitutional:  Negative for fatigue, fever and unexpected weight change.  Respiratory:  Negative for cough, shortness of breath and wheezing.   Cardiovascular:  Negative for chest pain, palpitations and leg swelling.  Gastrointestinal:  Negative for blood in stool, constipation, diarrhea, nausea and vomiting.  Endocrine: Negative for cold intolerance, heat intolerance and polyuria.  Genitourinary:  Negative  for dyspareunia, dysuria, flank pain, frequency, genital sores, hematuria, menstrual problem, pelvic pain, urgency, vaginal bleeding, vaginal discharge and vaginal pain.  Musculoskeletal:  Negative for back pain, joint swelling and myalgias.  Skin:  Negative for rash.  Neurological:  Negative for dizziness, syncope, light-headedness, numbness and headaches.  Hematological:  Negative for adenopathy.  Psychiatric/Behavioral:  Negative for agitation, confusion, sleep disturbance and suicidal ideas. The patient is not nervous/anxious.      Objective: BP 105/68   Pulse 71   Ht 5\' 4"  (1.626 m)   Wt 215 lb (97.5 kg)   LMP  (LMP Unknown)   BMI 36.90 kg/m    Physical Exam Constitutional:      Appearance: She is well-developed.  Genitourinary:     Vulva normal.     Genitourinary Comments: UTERUS SURG REM     Right Labia: No rash, tenderness or lesions.    Left Labia: No tenderness, lesions or rash.    No vaginal discharge, erythema or tenderness.     No vaginal atrophy present.     Right Adnexa: not tender and no mass present.    Left Adnexa: not tender and no mass present.    Cervix is not absent.     No cervical polyp.     Uterus is absent.  Breasts:    Right: No mass, nipple discharge, skin change or tenderness.     Left: No mass, nipple discharge, skin change or tenderness.  Neck:     Thyroid : No thyromegaly.  Cardiovascular:     Rate and Rhythm: Normal rate and regular rhythm.     Heart sounds: Normal heart sounds. No murmur  heard. Pulmonary:     Effort: Pulmonary effort is normal.     Breath sounds: Normal breath sounds.  Abdominal:     Palpations: Abdomen is soft.     Tenderness: There is no abdominal tenderness. There is no guarding.  Musculoskeletal:        General: Normal range of motion.     Cervical back: Normal range of motion.  Neurological:     General: No focal deficit present.     Mental Status: She is alert and oriented to person, place, and time.     Cranial Nerves: No cranial nerve deficit.  Skin:    General: Skin is warm and dry.  Psychiatric:        Mood and Affect: Mood normal.        Behavior: Behavior normal.        Thought Content: Thought content normal.        Judgment: Judgment normal.  Vitals reviewed.     Assessment/Plan:  Encounter for annual routine gynecological examination  Cervical cancer screening - Plan: Cytology - PAP  Screening for HPV (human papillomavirus) - Plan: Cytology - PAP  Encounter for screening mammogram for malignant neoplasm of breast; pt current on mammo  Encounter for surveillance of contraceptive pills - Plan: levonorgestrel -ethinyl estradiol  (ALESSE) 0.1-20 MG-MCG tablet; OCP RF eRxd for ovar cyst prevention.   History of ovarian cyst - Plan: levonorgestrel -ethinyl estradiol  (ALESSE) 0.1-20 MG-MCG tablet  Vaginal dryness--neg vag exam, on OCPs. Discussed use of lubricants even if doesn't need vag ERT. Pt may also retry vag ERT. F/u prn.   Vulvar irritation - Plan: clotrimazole -betamethasone  (LOTRISONE ) cream; Rx RF prn sx.    Meds ordered this encounter  Medications   levonorgestrel -ethinyl estradiol  (ALESSE) 0.1-20 MG-MCG tablet    Sig: Take 1 tablet by mouth daily. CONTINUOUS DOSING  Dispense:  84 tablet    Refill:  4    Supervising Provider:   ROBY, MICIA [[id number]]   clotrimazole -betamethasone  (LOTRISONE ) cream    Sig: Apply externally BID prn sx up to 2 wks    Dispense:  30 g    Refill:  0    Supervising Provider:   Sofia Dunn [[id number]]             GYN counsel breast self exam, mammography screening, adequate intake of calcium  and vitamin D , diet and exercise     F/U  Return in about 1 year (around 04/27/2025).  Luby Seamans B. Cher Egnor, PA-C 04/27/2024 4:17 PM

## 2024-04-27 NOTE — Patient Instructions (Signed)
 I value your feedback and you entrusting [place] with your care. If you get a [place] patient survey, I would appreciate you taking the time to let us know about your experience today. Thank you! ? ? ?

## 2024-04-29 LAB — CYTOLOGY - PAP
Comment: NEGATIVE
Diagnosis: NEGATIVE
High risk HPV: NEGATIVE

## 2024-05-02 DIAGNOSIS — F411 Generalized anxiety disorder: Secondary | ICD-10-CM | POA: Diagnosis not present

## 2024-05-04 ENCOUNTER — Ambulatory Visit: Admitting: Dermatology

## 2024-05-04 ENCOUNTER — Encounter: Payer: Self-pay | Admitting: Dermatology

## 2024-05-04 VITALS — BP 131/86

## 2024-05-04 DIAGNOSIS — L6681 Central centrifugal cicatricial alopecia: Secondary | ICD-10-CM

## 2024-05-04 DIAGNOSIS — L649 Androgenic alopecia, unspecified: Secondary | ICD-10-CM | POA: Diagnosis not present

## 2024-05-04 MED ORDER — SAFETY SEAL MISCELLANEOUS MISC: 5 refills | Status: DC

## 2024-05-04 NOTE — Progress Notes (Signed)
   New Patient Visit   Subjective  Alejandra Watkins is a 48 y.o. female who presents for the following: New Pt - Hair Loss  Patient states she has hair loss located at the scalp that she would like to have examined. Patient reports the areas have been there for 10 years. She reports the areas are not bothersome. Patient rates irritation 0 out of 10. She states that the areas have not spread. Patient reports she has previously been treated for these areas by previous dermatologist at [facility]. Pt was Rx Minoxidil but it was D/C as pt has an allergic reaction. Pt has tried OTC Nutrafol but it made her itchy so she D/C as well. Patient denied Hx of bx. Patient denied family history of skin cancer(s).   The following portions of the chart were reviewed this encounter and updated as appropriate: medications, allergies, medical history  Review of Systems:  No other skin or systemic complaints except as noted in HPI or Assessment and Plan.  Objective  Well appearing patient in no apparent distress; mood and affect are within normal limits.   A focused examination was performed of the following areas: scalp   Relevant exam findings are noted in the Assessment and Plan.               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 CCCA is characterized by inflammation leading to scarring and alopecia, often triggered by genetic predisposition and certain hair care practices. Examination reveals thinning at the vertex and temples, with loss of follicular openings indicating scarring. The patient has a history of wearing wigs but recently stopped. Androgenetic alopecia is also present, contributing to the overall hair loss pattern. Patient reports hair thinning for a couple of years, tenderness on the crown, and newer thinning at the temples.  - Plan:     Prescribe topical clobetasol mixed with finasteride from [facility]     - Apply in the morning to avoid unwanted hair growth on the face     - Use dropper for application, massage in, and wash hands thoroughly    Recommend collagen supplements (e.g., Black Girl Vitamin or Vital Proteins)    Recommend Viviscal for hair growth    Avoid Neutrofol due to previous reaction    Consider clobetasol injections if progress is slow at follow-up   Long term medication management.  Patient is using long term (months to years) prescription medication  to control their dermatologic condition.  These medications require periodic monitoring to evaluate for efficacy and side effects and may require periodic laboratory monitoring.  CENTRAL CENTRIFUGAL CICATRICIAL ALOPECIA   Related Medications Safety Seal Miscellaneous MISC Clobetasol USP 7% Finasteride USP 0.1% - apply to affected areas of the scalp every morning.  Return in about 4 months (around 09/04/2024) for CCCA.   Documentation: I have reviewed the above documentation for accuracy and completeness, and I agree with the above.  I, Shirron Louanne Roussel, CMA, am acting as scribe for [employer], DO.   Louana Roup, DO

## 2024-05-04 NOTE — Patient Instructions (Addendum)
 Date: Mon May 04 2024  Hello Alejandra Watkins,  Thank you for visiting today. Here is a summary of the key instructions:  - Medications: Apply clobetasol-finasteride combo from [facility] in the morning   - Use a dropper to apply   - Massage it into the scalp   - Wash hands thoroughly after application  - Supplements:   - Consider taking collagen supplements like Black Girl Vitamin or Vital Proteins   - Take Viviscal for hair growth  - Avoid: Stop using Neutrofol due to previous reaction  - Follow-up: Return for follow-up appointment in 4 months  - Expected Results: Look for improvements in hair growth in about 4 months  We look forward to seeing the positive changes in your next visit. If you have any questions or concerns before then, please do not hesitate to contact our office.  Warm regards,  Dr. Louana Roup Dermat[facility]            Important Information  Due to recent changes in healthcare laws, you may see results of your pathology and/or laboratory studies on MyChart before the doctors have had a chance to review them. We understand that in some cases there may be results that are confusing or concerning to you. Please understand that not all results are received at the same time and often the doctors may need to interpret multiple results in order to provide you with the best plan of care or course of treatment. Therefore, we ask that you please give us  2 business days to thoroughly review all your results before contacting the office for clarification. Should we see a critical lab result, you will be contacted sooner.   If You Need Anything After Your Visit  If you have any questions or concerns for your doctor, please call our main line at [phone] If no one answers, please leave a voicemail as directed and we will return your call as soon as possible. Messages left after 4 pm will be answered the following business day.   You may also send us  a message via MyChart.  We typically respond to MyChart messages within 1-2 business days.  For prescription refills, please ask your pharmacy to contact our office. Our fax number is [phone].  If you have an urgent issue when the clinic is closed that cannot wait until the next business day, you can page your doctor at the number below.    Please note that while we do our best to be available for urgent issues outside of office hours, we are not available 24/7.   If you have an urgent issue and are unable to reach us , you may choose to seek medical care at your doctor's office, retail clinic, urgent care center, or emergency room.  If you have a medical emergency, please immediately call 911 or go to the emergency department. In the event of inclement weather, please call our main line at [phone] for an update on the status of any delays or closures.  Dermatology Medication Tips: Please keep the boxes that topical medications come in in order to help keep track of the instructions about where and how to use these. Pharmacies typically print the medication instructions only on the boxes and not directly on the medication tubes.   If your medication is too expensive, please contact our office at [phone] or send us  a message through MyChart.   We are unable to tell what your co-pay for medications will be in advance as this is different  depending on your insurance coverage. However, we may be able to find a substitute medication at lower cost or fill out paperwork to get insurance to cover a needed medication.   If a prior authorization is required to get your medication covered by your insurance company, please allow us  1-2 business days to complete this process.  Drug prices often vary depending on where the prescription is filled and some pharmacies may offer cheaper prices.  The website www.goodrx.com contains coupons for medications through different pharmacies. The prices here do not account for  what the cost may be with help from insurance (it may be cheaper with your insurance), but the website can give you the price if you did not use any insurance.  - You can print the associated coupon and take it with your prescription to the pharmacy.  - You may also stop by our office during regular business hours and pick up a GoodRx coupon card.  - If you need your prescription sent electronically to a different pharmacy, notify our office through [facility] or by phone at [phone]

## 2024-05-05 ENCOUNTER — Telehealth: Payer: Self-pay

## 2024-05-05 NOTE — Telephone Encounter (Signed)
 [facility] called to clarify the strength of clobetasol and finasteride of prescription sent in 05/04/2024. Advised pharmacist clobetasol should be 0.05% and finasteride should be 1%/hd

## 2024-05-18 ENCOUNTER — Other Ambulatory Visit: Payer: Self-pay

## 2024-06-11 ENCOUNTER — Ambulatory Visit: Payer: 59 | Admitting: Dermatology

## 2024-06-13 DIAGNOSIS — F419 Anxiety disorder, unspecified: Secondary | ICD-10-CM | POA: Diagnosis not present

## 2024-07-04 DIAGNOSIS — F4320 Adjustment disorder, unspecified: Secondary | ICD-10-CM | POA: Diagnosis not present

## 2024-07-25 ENCOUNTER — Other Ambulatory Visit: Payer: Self-pay | Admitting: Obstetrics and Gynecology

## 2024-07-25 DIAGNOSIS — N898 Other specified noninflammatory disorders of vagina: Secondary | ICD-10-CM

## 2024-07-27 ENCOUNTER — Other Ambulatory Visit: Payer: Self-pay

## 2024-07-27 MED ORDER — ESTRADIOL 0.1 MG/GM VA CREA
TOPICAL_CREAM | VAGINAL | 1 refills | Status: AC
  Filled 2024-07-27: qty 42.5, 30d supply, fill #0

## 2024-08-05 DIAGNOSIS — F411 Generalized anxiety disorder: Secondary | ICD-10-CM | POA: Diagnosis not present

## 2024-08-13 ENCOUNTER — Ambulatory Visit (INDEPENDENT_AMBULATORY_CARE_PROVIDER_SITE_OTHER): Admitting: Internal Medicine

## 2024-08-13 ENCOUNTER — Other Ambulatory Visit: Payer: Self-pay

## 2024-08-13 VITALS — BP 122/84 | Ht 64.0 in | Wt 210.8 lb

## 2024-08-13 DIAGNOSIS — N898 Other specified noninflammatory disorders of vagina: Secondary | ICD-10-CM | POA: Diagnosis not present

## 2024-08-13 DIAGNOSIS — E1169 Type 2 diabetes mellitus with other specified complication: Secondary | ICD-10-CM | POA: Diagnosis not present

## 2024-08-13 DIAGNOSIS — E66812 Obesity, class 2: Secondary | ICD-10-CM

## 2024-08-13 DIAGNOSIS — E785 Hyperlipidemia, unspecified: Secondary | ICD-10-CM | POA: Diagnosis not present

## 2024-08-13 DIAGNOSIS — E119 Type 2 diabetes mellitus without complications: Secondary | ICD-10-CM

## 2024-08-13 DIAGNOSIS — Z0001 Encounter for general adult medical examination with abnormal findings: Secondary | ICD-10-CM

## 2024-08-13 DIAGNOSIS — D500 Iron deficiency anemia secondary to blood loss (chronic): Secondary | ICD-10-CM | POA: Diagnosis not present

## 2024-08-13 DIAGNOSIS — Z6836 Body mass index (BMI) 36.0-36.9, adult: Secondary | ICD-10-CM

## 2024-08-13 DIAGNOSIS — R61 Generalized hyperhidrosis: Secondary | ICD-10-CM

## 2024-08-13 DIAGNOSIS — Z7985 Long-term (current) use of injectable non-insulin antidiabetic drugs: Secondary | ICD-10-CM

## 2024-08-13 MED ORDER — MOUNJARO 2.5 MG/0.5ML ~~LOC~~ SOAJ
2.5000 mg | SUBCUTANEOUS | 1 refills | Status: AC
  Filled 2024-08-13: qty 6, 84d supply, fill #0
  Filled 2024-11-09: qty 6, 84d supply, fill #1

## 2024-08-13 NOTE — Patient Instructions (Signed)

## 2024-08-13 NOTE — Assessment & Plan Note (Signed)
 Encourage diet and exercise for weight loss

## 2024-08-13 NOTE — Progress Notes (Signed)
 Subjective:    Patient ID: Alejandra Watkins, female    DOB: 1976/07/23, 48 y.o.   MRN: [id number]  HPI  Patient presents to clinic today for her annual exam. She would like her hormones checked. She has started having night sweats and vaginal dryness. She does not have periods because she had a hysterectomy. She is on OCP's and was recently started on estrace  cream by GYN.  Flu: 08/2023 Tetanus: unsure COVID: X 2 Prevnar 20: never Pap smear: Hysterectomy Mammogram: 03/2024 Colon screening: 08/2020, 5 years Vision screening: annually Dentist: biannually  Diet: She does seafood, no other meat. She consumes fruits and veggies. She tries to avoid fried foods. She drinks mostly water, unsweet tea. Exercise: personal trainer 3 x week  Review of Systems     Past Medical History:  Diagnosis Date   Acid reflux    Anemia    Diabetes mellitus without complication (HCC)    Family history of colon cancer    History of chicken pox    Leiomyoma    Ovarian cyst    seen at [facility]    Current Outpatient Medications  Medication Sig Dispense Refill   azelastine  (ASTELIN ) 0.1 % nasal spray Place 1-2 sprays into both nostrils 2 (two) times daily. 30 mL 6   azelastine  (OPTIVAR ) 0.05 % ophthalmic solution Place 1 drop into affected eye(s) 2 (two) times daily. 18 mL 2   Cholecalciferol (VITAMIN D3) 5000 units CAPS Take 1 capsule by mouth daily.     clotrimazole -betamethasone  (LOTRISONE ) cream Apply externally twice daily as needed for up to 2 weeks 30 g 0   estradiol  (ESTRACE  VAGINAL) 0.1 MG/GM vaginal cream Insert 1 g vaginally nightly for 7 nights, then 1-2 times weekly as maintenance 42.5 g 1   glucose blood (BAYER CONTOUR NEXT TEST) test strip 1 each by Other route 2 (two) times daily as needed for other. Use as instructed 200 each 2   levocetirizine (XYZAL ) 5 MG tablet Take 1 tablet (5 mg total) by mouth daily. 90 tablet 3   levonorgestrel -ethinyl estradiol  (ALESSE) 0.1-20 MG-MCG tablet Take 1  tablet by mouth daily continuously 84 tablet 4   montelukast  (SINGULAIR ) 10 MG tablet Take 1 tablet (10 mg total) by mouth daily. 90 tablet 3   pantoprazole  (PROTONIX ) 20 MG tablet Take 1 tablet (20 mg total) by mouth daily. 90 tablet 1   Safety Seal Miscellaneous MISC Clobetasol USP 7% Finasteride USP 0.1% - apply to affected areas of the scalp every morning. 30 g 5   tirzepatide  (MOUNJARO ) 2.5 MG/0.5ML Pen Inject 2.5 mg into the skin once a week. 6 mL 1   triamcinolone (NASACORT ALLERGY 24HR) 55 MCG/ACT AERO nasal inhaler 1 spray in each nostril Nasally Once a day for 30 days     No current facility-administered medications for this visit.    Allergies  Allergen Reactions   Minoxidil    No Known Allergies     Family History  Problem Relation Age of Onset   Hypertension Mother    Alcoholism Father    Hypertension Paternal Grandmother    Diabetes Paternal Grandmother        type 2   Lung cancer Paternal Uncle    Colon cancer Other    Diabetes Other        type 2   Hypertension Other    Diabetes Other        type 2   Stroke Other    Heart disease Neg Hx  Breast cancer Neg Hx     Social History   Socioeconomic History   Marital status: Married    Spouse name: Tanda   Number of children: 3   Years of education: Not on file   Highest education level: Master's degree (e.g., MA, MS, MEng, MEd, MSW, MBA)  Occupational History   Occupation: Personnel officer: [employer]  Tobacco Use   Smoking status: Never   Smokeless tobacco: Never  Vaping Use   Vaping status: Never Used  Substance and Sexual Activity   Alcohol use: Yes    Alcohol/week: 1.0 standard drink of alcohol    Types: 1 Glasses of wine per week    Comment: occasional   Drug use: No   Sexual activity: Yes    Birth control/protection: Surgical    Comment: Hysterectomy  Other Topics Concern   Not on file  Social History Narrative   Not on file   Social Drivers of Health   Financial  Resource Strain: Low Risk  (08/13/2024)   Overall Financial Resource Strain (CARDIA)    Difficulty of Paying Living Expenses: Not very hard  Food Insecurity: No Food Insecurity (08/13/2024)   Hunger Vital Sign    Worried About Running Out of Food in the Last Year: Never true    Ran Out of Food in the Last Year: Never true  Transportation Needs: No Transportation Needs (08/13/2024)   PRAPARE - Administrator, Civil Service (Medical): No    Lack of Transportation (Non-Medical): No  Physical Activity: Sufficiently Active (08/13/2024)   Exercise Vital Sign    Days of Exercise per Week: 3 days    Minutes of Exercise per Session: 50 min  Stress: Stress Concern Present (08/13/2024)   [employer] of Occupational Health - Occupational Stress Questionnaire    Feeling of Stress: To some extent  Social Connections: Moderately Isolated (08/13/2024)   Social Connection and Isolation Panel    Frequency of Communication with Friends and Family: More than three times a week    Frequency of Social Gatherings with Friends and Family: Once a week    Attends Religious Services: Patient declined    Database administrator or Organizations: No    Attends Engineer, structural: Not on file    Marital Status: Married  Catering manager Violence: Not on file     Constitutional: Denies fever, malaise, fatigue, headache or abrupt weight changes.  HEENT: Denies eye pain, eye redness, ear pain, ringing in the ears, wax buildup, runny nose, nasal congestion, bloody nose, or sore throat. Respiratory: Denies difficulty breathing, shortness of breath, cough or sputum production.   Cardiovascular: Denies chest pain, chest tightness, palpitations or swelling in the hands or feet.  Gastrointestinal: Denies abdominal pain, bloating, constipation, diarrhea or blood in the stool.  GU: Pt reports vaginal dryness. Denies urgency, frequency, pain with urination, burning sensation, blood in urine, odor or  discharge. Musculoskeletal: Denies decrease in range of motion, difficulty with gait, muscle pain or joint pain and swelling.  Skin: Denies redness, rashes, lesions or ulcercations.  Neurological: Pt reports night sweats. Denies dizziness, difficulty with memory, difficulty with speech or problems with balance and coordination.  Psych: Denies anxiety, depression, SI/HI.  No other specific complaints in a complete review of systems (except as listed in HPI above).  Objective:   Physical Exam  BP 122/84 (BP Location: Right Arm, Patient Position: Sitting, Cuff Size: Large)   Ht 5' 4 (1.626 m)  Wt 210 lb 12.8 oz (95.6 kg)   LMP  (LMP Unknown)   BMI 36.18 kg/m    Wt Readings from Last 3 Encounters:  04/27/24 215 lb (97.5 kg)  02/17/24 207 lb 6.4 oz (94.1 kg)  08/13/23 210 lb (95.3 kg)    General: Appears her stated age, obese, in NAD. Skin: Warm, dry and intact. No ulcerations noted. HEENT: Head: normal shape and size; Eyes: sclera white, no icterus, conjunctiva pink, PERRLA and EOMs intact;  Neck:  Neck supple, trachea midline. No masses, lumps or thyromegaly present.  Cardiovascular: Normal rate and rhythm. S1,S2 noted.  No murmur, rubs or gallops noted. No JVD or BLE edema.  Pulmonary/Chest: Normal effort and positive vesicular breath sounds. No respiratory distress. No wheezes, rales or ronchi noted.  Abdomen:  Normal bowel sounds.  Musculoskeletal: Strength 5/5 BUE/BLE. No difficulty with gait.  Neurological: Alert and oriented. Cranial nerves II-XII grossly intact. Coordination normal.  Psychiatric: Mood and affect normal. Behavior is normal. Judgment and thought content normal.    BMET    Component Value Date/Time   NA 140 02/17/2024 0815   NA 141 01/07/2017 0926   NA 141 10/30/2014 1545   K 4.0 02/17/2024 0815   K 4.5 10/30/2014 1545   CL 105 02/17/2024 0815   CL 107 10/30/2014 1545   CO2 28 02/17/2024 0815   CO2 28 10/30/2014 1545   GLUCOSE 87 02/17/2024 0815    GLUCOSE 83 10/30/2014 1545   BUN 12 02/17/2024 0815   BUN 12 01/07/2017 0926   BUN 16 10/30/2014 1545   CREATININE 0.80 02/17/2024 0815   CALCIUM  9.2 02/17/2024 0815   CALCIUM  8.0 (L) 10/30/2014 1545   GFRNONAA 93 01/07/2017 0926   GFRNONAA 54 (L) 10/30/2014 1545   GFRNONAA >60 05/08/2012 1158   GFRAA 107 01/07/2017 0926   GFRAA >60 10/30/2014 1545   GFRAA >60 05/08/2012 1158    Lipid Panel     Component Value Date/Time   CHOL 204 (H) 02/17/2024 0815   CHOL 219 (H) 01/07/2017 0926   TRIG 91 02/17/2024 0815   HDL 52 02/17/2024 0815   HDL 53 01/07/2017 0926   CHOLHDL 3.9 02/17/2024 0815   VLDL 11.8 07/11/2020 0910   LDLCALC 133 (H) 02/17/2024 0815    CBC    Component Value Date/Time   WBC 6.1 02/17/2024 0815   RBC 5.33 (H) 02/17/2024 0815   HGB 12.0 02/17/2024 0815   HGB 10.9 (L) 01/07/2017 1518   HCT 39.2 02/17/2024 0815   HCT 37.2 01/07/2017 1518   PLT 378 02/17/2024 0815   PLT 409 (H) 01/07/2017 1518   MCV 73.5 (L) 02/17/2024 0815   MCV 70 (L) 01/07/2017 1518   MCV 72 (L) 10/30/2014 1545   MCH 22.5 (L) 02/17/2024 0815   MCHC 30.6 (L) 02/17/2024 0815   RDW 15.2 (H) 02/17/2024 0815   RDW 17.5 (H) 01/07/2017 1518   RDW 15.5 (H) 10/30/2014 1545   LYMPHSABS 2.9 01/07/2017 1518   LYMPHSABS 2.6 10/30/2014 1545   MONOABS 1.1 (H) 07/23/2016 2117   MONOABS 0.8 10/30/2014 1545   EOSABS 0.2 01/07/2017 1518   EOSABS 0.1 10/30/2014 1545   BASOSABS 0.0 01/07/2017 1518   BASOSABS 0.1 10/30/2014 1545    Hgb A1C Lab Results  Component Value Date   HGBA1C 5.6 02/17/2024           Assessment & Plan:   Preventative health maintenance:  Encouraged her to get a flu shot in the fall  Tetanus declined Encouraged her to get her COVID booster Prevnar 20 declined She no longer needs to screen for cervical cancer Mammogram UTD Colon screening UTD Encouraged her to consume a balanced diet and exercise regimen Advised her to see an eye doctor and dentist annually We  will check CBC, c-Met, lipid, A1c, urine microalbumin today  Night sweats, vaginal dryness:  Will check estrogen, progesterone , testosterone, FSH/LH She will continue OCP's and estrace  cream per GYN  RTC in 6 months, follow-up chronic conditions Angeline Laura, NP

## 2024-08-14 ENCOUNTER — Ambulatory Visit: Payer: Self-pay | Admitting: Internal Medicine

## 2024-08-14 LAB — MICROALBUMIN / CREATININE URINE RATIO
Creatinine, Urine: 175 mg/dL (ref 20–275)
Microalb Creat Ratio: 2 mg/g{creat} (ref <30)
Microalb, Ur: 0.4 mg/dL

## 2024-08-18 LAB — CBC
HCT: 39.4 % (ref 35.0–45.0)
Hemoglobin: 11.8 g/dL (ref 11.7–15.5)
MCH: 22.5 pg — ABNORMAL LOW (ref 27.0–33.0)
MCHC: 29.9 g/dL — ABNORMAL LOW (ref 32.0–36.0)
MCV: 75 fL — ABNORMAL LOW (ref 80.0–100.0)
MPV: 9.5 fL (ref 7.5–12.5)
Platelets: 387 Thousand/uL (ref 140–400)
RBC: 5.25 Million/uL — ABNORMAL HIGH (ref 3.80–5.10)
RDW: 14.7 % (ref 11.0–15.0)
WBC: 6.1 Thousand/uL (ref 3.8–10.8)

## 2024-08-18 LAB — COMPREHENSIVE METABOLIC PANEL WITH GFR
AG Ratio: 1.7 (calc) (ref 1.0–2.5)
ALT: 14 U/L (ref 6–29)
AST: 15 U/L (ref 10–35)
Albumin: 4 g/dL (ref 3.6–5.1)
Alkaline phosphatase (APISO): 65 U/L (ref 31–125)
BUN: 14 mg/dL (ref 7–25)
CO2: 26 mmol/L (ref 20–32)
Calcium: 9.2 mg/dL (ref 8.6–10.2)
Chloride: 105 mmol/L (ref 98–110)
Creat: 0.86 mg/dL (ref 0.50–0.99)
Globulin: 2.4 g/dL (ref 1.9–3.7)
Glucose, Bld: 87 mg/dL (ref 65–99)
Potassium: 4 mmol/L (ref 3.5–5.3)
Sodium: 139 mmol/L (ref 135–146)
Total Bilirubin: 0.3 mg/dL (ref 0.2–1.2)
Total Protein: 6.4 g/dL (ref 6.1–8.1)
eGFR: 83 mL/min/1.73m2 (ref >=60)

## 2024-08-18 LAB — ESTROGENS, TOTAL: Estrogen: 45 pg/mL

## 2024-08-18 LAB — LIPID PANEL
Cholesterol: 192 mg/dL (ref <200)
HDL: 50 mg/dL (ref >=50)
LDL Cholesterol (Calc): 126 mg/dL — ABNORMAL HIGH
Non-HDL Cholesterol (Calc): 142 mg/dL — ABNORMAL HIGH (ref <130)
Total CHOL/HDL Ratio: 3.8 (calc) (ref <5.0)
Triglycerides: 70 mg/dL (ref <150)

## 2024-08-18 LAB — HEMOGLOBIN A1C
Hgb A1c MFr Bld: 5.8 % — ABNORMAL HIGH (ref <5.7)
Mean Plasma Glucose: 120 mg/dL
eAG (mmol/L): 6.6 mmol/L

## 2024-08-18 LAB — FSH/LH
FSH: 26 m[IU]/mL
LH: 12.3 m[IU]/mL

## 2024-08-18 LAB — IRON,TIBC AND FERRITIN PANEL
%SAT: 20 % (ref 16–45)
Ferritin: 78 ng/mL (ref 16–232)
Iron: 80 ug/dL (ref 40–190)
TIBC: 394 ug/dL (ref 250–450)

## 2024-08-18 LAB — PROGESTERONE: Progesterone: <0.5 ng/mL

## 2024-08-18 LAB — TESTOSTERONE, FREE: TESTOSTERONE FREE: 0.9 pg/mL (ref 0.2–5.0)

## 2024-08-24 DIAGNOSIS — F411 Generalized anxiety disorder: Secondary | ICD-10-CM | POA: Diagnosis not present

## 2024-08-25 ENCOUNTER — Encounter: Payer: Self-pay | Admitting: Obstetrics and Gynecology

## 2024-08-31 ENCOUNTER — Other Ambulatory Visit: Payer: Self-pay

## 2024-08-31 MED ORDER — ATORVASTATIN CALCIUM 10 MG PO TABS
10.0000 mg | ORAL_TABLET | Freq: Every day | ORAL | 1 refills | Status: AC
  Filled 2024-08-31 – 2024-09-24 (×2): qty 90, 90d supply, fill #0
  Filled 2024-12-27: qty 90, 90d supply, fill #1

## 2024-09-07 ENCOUNTER — Encounter: Payer: Self-pay | Admitting: Dermatology

## 2024-09-07 ENCOUNTER — Ambulatory Visit: Admitting: Dermatology

## 2024-09-07 VITALS — BP 148/88

## 2024-09-07 DIAGNOSIS — L6681 Central centrifugal cicatricial alopecia: Secondary | ICD-10-CM | POA: Diagnosis not present

## 2024-09-07 DIAGNOSIS — L649 Androgenic alopecia, unspecified: Secondary | ICD-10-CM

## 2024-09-07 MED ORDER — SAFETY SEAL MISCELLANEOUS MISC: 5 refills | Status: DC

## 2024-09-07 NOTE — Progress Notes (Signed)
   Follow-Up Visit   Subjective  Alejandra Watkins is a 48 y.o. female who presents for the following: CCCA follow up - She is using clobetasol/finasteride daily and taking Viviscal. The lady that does her hair says that she has seen some new growth. She states the biggest difference is the decrease in tenderness.   The following portions of the chart were reviewed this encounter and updated as appropriate: medications, allergies, medical history  Review of Systems:  No other skin or systemic complaints except as noted in HPI or Assessment and Plan.  Objective  Well appearing patient in no apparent distress; mood and affect are within normal limits.   A focused examination was performed of the following areas: Scalp   Relevant exam findings are noted in the Assessment and Plan.             Assessment & Plan   Central centrifugal cicatricial alopecia (CCCA) and Androgenetic Alopecia CCCA treatment effective with finasteride and clobetasol. Positive response with reduced tenderness and new hair growth. Topical metformin added for anti-inflammatory benefits. Scarring persists but new growth noted around these areas. Further hair growth expected, though some areas may remain sparse.  - Continue finasteride and clobetasol application every morning. - Add topical metformin to the regimen. - Ensure prescription is updated with pharmacy. - Continue Viviscal supplementation. - Start collagen supplementation, preferably in powder form added to coffee.  CENTRAL CENTRIFUGAL CICATRICIAL ALOPECIA   Related Medications Safety Seal Miscellaneous MISC Clobetasol USP 7% Finasteride USP 0.1% Metformin solution  - apply to affected areas of the scalp every morning.  Return in about 6 months (around 03/07/2025) for CCCA.  I, Roseline Hutchinson, CMA, am acting as scribe for [employer], DO .   Documentation: I have reviewed the above documentation for accuracy and completeness, and I agree  with the above.  Delon Lenis, DO

## 2024-09-07 NOTE — Patient Instructions (Addendum)
 VISIT SUMMARY:  Today, you had a follow-up appointment to review your hair loss treatment regimen. You have been using finasteride and clobetasol with good results, including reduced inflammation and new hair growth. Minoxidil was discontinued due to a reaction. You are also taking Viviscal and plan to add collagen to your routine.  YOUR PLAN:  -CENTRAL CENTRIFUGAL CICATRICIAL ALOPECIA (CCCA and Androgenetic Alopecia:  Your treatment with finasteride and clobetasol is working well, showing reduced tenderness and new hair growth. We are adding topical metformin to help with inflammation. Continue using finasteride and clobetasol every morning, and start using the topical metformin as directed. Keep taking Viviscal and start adding collagen powder to your coffee.  INSTRUCTIONS:  Ensure your prescription is updated with the pharmacy.          Important Information  Due to recent changes in healthcare laws, you may see results of your pathology and/or laboratory studies on MyChart before the doctors have had a chance to review them. We understand that in some cases there may be results that are confusing or concerning to you. Please understand that not all results are received at the same time and often the doctors may need to interpret multiple results in order to provide you with the best plan of care or course of treatment. Therefore, we ask that you please give us  2 business days to thoroughly review all your results before contacting the office for clarification. Should we see a critical lab result, you will be contacted sooner.   If You Need Anything After Your Visit  If you have any questions or concerns for your doctor, please call our main line at [phone] If no one answers, please leave a voicemail as directed and we will return your call as soon as possible. Messages left after 4 pm will be answered the following business day.   You may also send us  a message via MyChart. We  typically respond to MyChart messages within 1-2 business days.  For prescription refills, please ask your pharmacy to contact our office. Our fax number is [phone].  If you have an urgent issue when the clinic is closed that cannot wait until the next business day, you can page your doctor at the number below.    Please note that while we do our best to be available for urgent issues outside of office hours, we are not available 24/7.   If you have an urgent issue and are unable to reach us , you may choose to seek medical care at your doctor's office, retail clinic, urgent care center, or emergency room.  If you have a medical emergency, please immediately call 911 or go to the emergency department. In the event of inclement weather, please call our main line at [phone] for an update on the status of any delays or closures.  Dermatology Medication Tips: Please keep the boxes that topical medications come in in order to help keep track of the instructions about where and how to use these. Pharmacies typically print the medication instructions only on the boxes and not directly on the medication tubes.   If your medication is too expensive, please contact our office at [phone] or send us  a message through MyChart.   We are unable to tell what your co-pay for medications will be in advance as this is different depending on your insurance coverage. However, we may be able to find a substitute medication at lower cost or fill out paperwork to get insurance to cover a  needed medication.   If a prior authorization is required to get your medication covered by your insurance company, please allow us  1-2 business days to complete this process.  Drug prices often vary depending on where the prescription is filled and some pharmacies may offer cheaper prices.  The website www.goodrx.com contains coupons for medications through different pharmacies. The prices here do not account for what  the cost may be with help from insurance (it may be cheaper with your insurance), but the website can give you the price if you did not use any insurance.  - You can print the associated coupon and take it with your prescription to the pharmacy.  - You may also stop by our office during regular business hours and pick up a GoodRx coupon card.  - If you need your prescription sent electronically to a different pharmacy, notify our office through [facility] or by phone at [phone]

## 2024-09-10 ENCOUNTER — Other Ambulatory Visit: Payer: Self-pay

## 2024-09-13 ENCOUNTER — Other Ambulatory Visit: Payer: Self-pay | Admitting: Internal Medicine

## 2024-09-14 ENCOUNTER — Other Ambulatory Visit: Payer: Self-pay

## 2024-09-14 ENCOUNTER — Other Ambulatory Visit: Payer: Self-pay | Admitting: Internal Medicine

## 2024-09-15 ENCOUNTER — Other Ambulatory Visit: Payer: Self-pay

## 2024-09-15 DIAGNOSIS — H1045 Other chronic allergic conjunctivitis: Secondary | ICD-10-CM | POA: Diagnosis not present

## 2024-09-15 MED ORDER — LEVOCETIRIZINE DIHYDROCHLORIDE 5 MG PO TABS
5.0000 mg | ORAL_TABLET | Freq: Every evening | ORAL | 2 refills | Status: DC
  Filled 2024-09-15: qty 90, 90d supply, fill #0

## 2024-09-15 MED ORDER — TRIAMCINOLONE ACETONIDE 55 MCG/ACT NA AERO
1.0000 | INHALATION_SPRAY | Freq: Every day | NASAL | 6 refills | Status: DC
  Filled 2024-09-15: qty 16.9, 30d supply, fill #0

## 2024-09-15 MED ORDER — MONTELUKAST SODIUM 10 MG PO TABS
10.0000 mg | ORAL_TABLET | Freq: Every day | ORAL | 2 refills | Status: DC
  Filled 2024-09-15: qty 90, 90d supply, fill #0

## 2024-09-15 MED ORDER — AZELASTINE HCL 0.1 % NA SOLN
2.0000 | Freq: Two times a day (BID) | NASAL | 5 refills | Status: DC
  Filled 2024-09-15: qty 30, 50d supply, fill #0

## 2024-09-16 ENCOUNTER — Other Ambulatory Visit: Payer: Self-pay

## 2024-09-16 MED FILL — Pantoprazole Sodium EC Tab 20 MG (Base Equiv): ORAL | 90 days supply | Qty: 90 | Fill #0 | Status: AC

## 2024-09-16 NOTE — Telephone Encounter (Signed)
 Requested Prescriptions  Pending Prescriptions Disp Refills   pantoprazole  (PROTONIX ) 20 MG tablet 90 tablet 1    Sig: Take 1 tablet (20 mg total) by mouth daily.     Gastroenterology: Proton Pump Inhibitors Passed - 09/16/2024  7:59 AM      Passed - Valid encounter within last 12 months    Recent Outpatient Visits           1 month ago Encounter for general adult medical examination with abnormal findings   [facility] [place], [place] W, NP   7 months ago Type 2 diabetes mellitus without complication, without long-term current use of insulin  [facility]    [facility] [place], Angeline ORN, [place]

## 2024-09-23 ENCOUNTER — Encounter: Payer: Self-pay | Admitting: Dermatology

## 2024-09-23 DIAGNOSIS — L6681 Central centrifugal cicatricial alopecia: Secondary | ICD-10-CM

## 2024-09-23 MED ORDER — SAFETY SEAL MISCELLANEOUS MISC: 5 refills | Status: AC

## 2024-09-24 ENCOUNTER — Other Ambulatory Visit: Payer: Self-pay

## 2024-09-28 NOTE — Telephone Encounter (Signed)
 This CMA reached out to [facility] again and was informed by rep that clobetasol was marked as 7% when typically is ordered as 0.05%. The change was corrected verbally to 0.05% to be processed/shipped.   The previous correction was made due to metformin not having a percentage listed. However, the rep spoken to did not notice that clobetasol was listed as a higher strength that what is typically ordered.

## 2024-10-03 DIAGNOSIS — F411 Generalized anxiety disorder: Secondary | ICD-10-CM | POA: Diagnosis not present

## 2024-10-07 ENCOUNTER — Encounter: Payer: Self-pay | Admitting: Internal Medicine

## 2024-10-07 ENCOUNTER — Ambulatory Visit: Admitting: Internal Medicine

## 2024-10-07 ENCOUNTER — Other Ambulatory Visit: Payer: Self-pay

## 2024-10-07 VITALS — BP 112/78 | Ht 64.0 in | Wt 210.0 lb

## 2024-10-07 DIAGNOSIS — F5105 Insomnia due to other mental disorder: Secondary | ICD-10-CM

## 2024-10-07 DIAGNOSIS — F99 Mental disorder, not otherwise specified: Secondary | ICD-10-CM

## 2024-10-07 DIAGNOSIS — F411 Generalized anxiety disorder: Secondary | ICD-10-CM | POA: Diagnosis not present

## 2024-10-07 MED ORDER — PAROXETINE HCL ER 12.5 MG PO TB24
12.5000 mg | ORAL_TABLET | Freq: Every day | ORAL | 1 refills | Status: DC
  Filled 2024-10-07: qty 90, 90d supply, fill #0

## 2024-10-07 MED ORDER — PAROXETINE HCL 10 MG PO TABS
10.0000 mg | ORAL_TABLET | Freq: Every day | ORAL | 1 refills | Status: AC
Start: 2024-10-07 — End: ?
  Filled 2024-10-07: qty 90, 90d supply, fill #0

## 2024-10-07 NOTE — Patient Instructions (Signed)

## 2024-10-07 NOTE — Addendum Note (Signed)
 Addended by: ANTONETTE ANGELINE ORN on: 10/07/2024 11:16 AM   Modules accepted: Orders

## 2024-10-07 NOTE — Progress Notes (Signed)
 Subjective:    Patient ID: Alejandra Watkins, female    DOB: 1976-08-01, 48 y.o.   MRN: [id number]  HPI  Discussed the use of AI scribe software for clinical note transcription with the patient, who gave verbal consent to proceed.  Alejandra Watkins is a 48 year old female who presents with symptoms of generalized anxiety disorder.  She began therapy at the beginning of the year due to unexplained crying episodes, particularly in the shower. Therapy sessions occur monthly and are beneficial.  She has a history of chronic tension headaches, attributed to stress, which are exacerbated by overthinking, especially when unable to control situations. This overthinking often prevents her from sleeping at night, as her 'brain won't shut off.'  She denies any history of treatment for anxiety or depression. Symptoms of anxiety are present more days than not, but not daily. No depressive symptoms are reported. She feels excessively tired during the day, is more irritable, and overthinks constantly, which she associates with her tension headaches.  She is menopausal and no longer has periods. There is no family history of similar medication use for anxiety or mood disorders, as her culture typically does not utilize such medications, preferring to 'deal with it' or seek support through church.   Review of Systems     Past Medical History:  Diagnosis Date   Acid reflux    Allergy    Anemia    Diabetes mellitus without complication (HCC)    Family history of colon cancer    History of chicken pox    Leiomyoma    Ovarian cyst    seen at [facility]    Current Outpatient Medications  Medication Sig Dispense Refill   atorvastatin  (LIPITOR) 10 MG tablet Take 1 tablet (10 mg total) by mouth daily. 90 tablet 1   azelastine  (ASTELIN ) 0.1 % nasal spray Place 1-2 sprays into both nostrils 2 (two) times daily. 30 mL 6   azelastine  (ASTELIN ) 0.1 % nasal spray Place 1-2 sprays into both nostrils 2 (two) times  daily. 30 mL 5   azelastine  (OPTIVAR ) 0.05 % ophthalmic solution Place 1 drop into affected eye(s) 2 (two) times daily. 18 mL 2   Cholecalciferol (VITAMIN D3) 5000 units CAPS Take 1 capsule by mouth daily.     clotrimazole -betamethasone  (LOTRISONE ) cream Apply externally twice daily as needed for up to 2 weeks 30 g 0   estradiol  (ESTRACE  VAGINAL) 0.1 MG/GM vaginal cream Insert 1 g vaginally nightly for 7 nights, then 1-2 times weekly as maintenance 42.5 g 1   glucose blood (BAYER CONTOUR NEXT TEST) test strip 1 each by Other route 2 (two) times daily as needed for other. Use as instructed 200 each 2   levocetirizine (XYZAL ) 5 MG tablet Take 1 tablet (5 mg total) by mouth daily. 90 tablet 3   levocetirizine (XYZAL ) 5 MG tablet Take 1 tablet (5 mg total) by mouth every evening. 90 tablet 2   levonorgestrel -ethinyl estradiol  (ALESSE) 0.1-20 MG-MCG tablet Take 1 tablet by mouth daily continuously 84 tablet 4   montelukast  (SINGULAIR ) 10 MG tablet Take 1 tablet (10 mg total) by mouth daily. 90 tablet 3   montelukast  (SINGULAIR ) 10 MG tablet Take 1 tablet (10 mg total) by mouth daily. 90 tablet 2   pantoprazole  (PROTONIX ) 20 MG tablet Take 1 tablet (20 mg total) by mouth daily. 90 tablet 1   Safety Seal Miscellaneous MISC Clobetasol USP 7% Finasteride USP 0.1% Metformin 10% solution  - apply to affected  areas of the scalp every morning. 30 g 5   tirzepatide  (MOUNJARO ) 2.5 MG/0.5ML Pen Inject 2.5 mg into the skin once a week. 6 mL 1   triamcinolone (NASACORT ALLERGY 24HR) 55 MCG/ACT AERO nasal inhaler 1 spray in each nostril Nasally Once a day for 30 days     triamcinolone (NASACORT ALLERGY 24HR) 55 MCG/ACT AERO nasal inhaler Place 1 spray into the nose daily. 16.9 mL 6   No current facility-administered medications for this visit.    Allergies  Allergen Reactions   Minoxidil    No Known Allergies     Family History  Problem Relation Age of Onset   Hypertension Mother    Alcoholism Father     Alcohol abuse Father    Cancer Father    Hypertension Paternal Grandmother    Diabetes Paternal Grandmother        type 2   Lung cancer Paternal Uncle    Cancer Paternal Uncle    Colon cancer Other    Diabetes Other        type 2   Hypertension Other    Diabetes Other        type 2   Stroke Other    Cancer Paternal Uncle    Diabetes Maternal Aunt    Hypertension Maternal Aunt    Diabetes Maternal Uncle    Diabetes Maternal Aunt    Hypertension Maternal Aunt    ADD / ADHD Son    Heart disease Neg Hx    Breast cancer Neg Hx     Social History   Socioeconomic History   Marital status: Married    Spouse name: Tanda   Number of children: 3   Years of education: Not on file   Highest education level: Master's degree (e.g., MA, MS, MEng, MEd, MSW, MBA)  Occupational History   Occupation: Personnel officer:   Tobacco Use   Smoking status: Never   Smokeless tobacco: Never  Vaping Use   Vaping status: Never Used  Substance and Sexual Activity   Alcohol use: Yes    Alcohol/week: 1.0 standard drink of alcohol    Comment: occasional   Drug use: No   Sexual activity: Yes    Birth control/protection: Surgical    Comment: Hysterectomy  Other Topics Concern   Not on file  Social History Narrative   Not on file   Social Drivers of Health   Financial Resource Strain: Low Risk  (10/03/2024)   Overall Financial Resource Strain (CARDIA)    Difficulty of Paying Living Expenses: Not hard at all  Food Insecurity: No Food Insecurity (10/03/2024)   Hunger Vital Sign    Worried About Running Out of Food in the Last Year: Never true    Ran Out of Food in the Last Year: Never true  Transportation Needs: No Transportation Needs (10/03/2024)   PRAPARE - Administrator, Civil Service (Medical): No    Lack of Transportation (Non-Medical): No  Physical Activity: Insufficiently Active (10/03/2024)   Exercise Vital Sign    Days of Exercise per  Week: 3 days    Minutes of Exercise per Session: 40 min  Stress: Stress Concern Present (10/03/2024)   [employer] of Occupational Health - Occupational Stress Questionnaire    Feeling of Stress: Rather much  Social Connections: Moderately Isolated (10/03/2024)   Social Connection and Isolation Panel    Frequency of Communication with Friends and Family: More than three times a week  Frequency of Social Gatherings with Friends and Family: Once a week    Attends Religious Services: Never    Database administrator or Organizations: No    Attends Engineer, structural: Not on file    Marital Status: Married  Catering manager Violence: Not on file     Constitutional: Denies fever, malaise, fatigue, headache or abrupt weight changes.  Respiratory: Denies difficulty breathing, shortness of breath, cough or sputum production.   Cardiovascular: Denies chest pain, chest tightness, palpitations or swelling in the hands or feet.  Skin: Denies redness, rashes, lesions or ulcercations.  Neurological: Pt reports insomnia. Denies dizziness, difficulty with memory, difficulty with speech or problems with balance and coordination.  Psych: Patient reports anxiety.  Denies depression, SI/HI.  No other specific complaints in a complete review of systems (except as listed in HPI above).  Objective:   Physical Exam BP 112/78 (BP Location: Right Arm, Patient Position: Sitting, Cuff Size: Large)   Ht 5' 4 (1.626 m)   Wt 210 lb (95.3 kg)   LMP  (LMP Unknown)   BMI 36.05 kg/m     Wt Readings from Last 3 Encounters:  08/13/24 210 lb 12.8 oz (95.6 kg)  04/27/24 215 lb (97.5 kg)  02/17/24 207 lb 6.4 oz (94.1 kg)    General: Appears her stated age, obese, in NAD. Cardiovascular: Normal rate. Pulmonary/Chest: Normal effort. No respiratory distress.  Musculoskeletal:  No difficulty with gait.  Neurological: Alert and oriented.  Psychiatric: Mood and affect normal. Behavior is normal.  Judgment and thought content normal.    BMET    Component Value Date/Time   NA 139 08/13/2024 0846   NA 141 01/07/2017 0926   NA 141 10/30/2014 1545   K 4.0 08/13/2024 0846   K 4.5 10/30/2014 1545   CL 105 08/13/2024 0846   CL 107 10/30/2014 1545   CO2 26 08/13/2024 0846   CO2 28 10/30/2014 1545   GLUCOSE 87 08/13/2024 0846   GLUCOSE 83 10/30/2014 1545   BUN 14 08/13/2024 0846   BUN 12 01/07/2017 0926   BUN 16 10/30/2014 1545   CREATININE 0.86 08/13/2024 0846   CALCIUM  9.2 08/13/2024 0846   CALCIUM  8.0 (L) 10/30/2014 1545   GFRNONAA 93 01/07/2017 0926   GFRNONAA 54 (L) 10/30/2014 1545   GFRNONAA >60 05/08/2012 1158   GFRAA 107 01/07/2017 0926   GFRAA >60 10/30/2014 1545   GFRAA >60 05/08/2012 1158    Lipid Panel     Component Value Date/Time   CHOL 192 08/13/2024 0846   CHOL 219 (H) 01/07/2017 0926   TRIG 70 08/13/2024 0846   HDL 50 08/13/2024 0846   HDL 53 01/07/2017 0926   CHOLHDL 3.8 08/13/2024 0846   VLDL 11.8 07/11/2020 0910   LDLCALC 126 (H) 08/13/2024 0846    CBC    Component Value Date/Time   WBC 6.1 08/13/2024 0846   RBC 5.25 (H) 08/13/2024 0846   HGB 11.8 08/13/2024 0846   HGB 10.9 (L) 01/07/2017 1518   HCT 39.4 08/13/2024 0846   HCT 37.2 01/07/2017 1518   PLT 387 08/13/2024 0846   PLT 409 (H) 01/07/2017 1518   MCV 75.0 (L) 08/13/2024 0846   MCV 70 (L) 01/07/2017 1518   MCV 72 (L) 10/30/2014 1545   MCH 22.5 (L) 08/13/2024 0846   MCHC 29.9 (L) 08/13/2024 0846   RDW 14.7 08/13/2024 0846   RDW 17.5 (H) 01/07/2017 1518   RDW 15.5 (H) 10/30/2014 1545   LYMPHSABS  2.9 01/07/2017 1518   LYMPHSABS 2.6 10/30/2014 1545   MONOABS 1.1 (H) 07/23/2016 2117   MONOABS 0.8 10/30/2014 1545   EOSABS 0.2 01/07/2017 1518   EOSABS 0.1 10/30/2014 1545   BASOSABS 0.0 01/07/2017 1518   BASOSABS 0.1 10/30/2014 1545    Hgb A1C Lab Results  Component Value Date   HGBA1C 5.8 (H) 08/13/2024           Assessment & Plan:   Assessment and Plan     Generalized anxiety disorder, insomnia Generalized anxiety disorder with chronic tension headaches, difficulty sleeping, and overthinking. Symptoms worsened recently. No prior anxiety treatment. Referred by therapist. - Prescribe paroxetine 12.5 mg extended release; if not covered, prescribe 10 mg regular release. - Continue therapy sessions. - Consider trazodone for sleep if anxiety treatment does not improve sleep quality. - Explained serotonin deficiency in anxiety and SSRIs role in increasing serotonin. SSRIs take 4-6 weeks for effect, used for at least 6 months. Side effects include nausea, jitteriness, dizziness, resolving in two weeks. Advised against abrupt discontinuation to prevent serotonin decline and potential depression or suicidal thoughts. - Discussed paroxetine benefits, especially in menopausal women, for mood and menopausal symptoms.        RTC in 4 months, follow-up chronic conditions Angeline Laura, NP

## 2024-11-18 DIAGNOSIS — E119 Type 2 diabetes mellitus without complications: Secondary | ICD-10-CM | POA: Diagnosis not present

## 2024-11-18 DIAGNOSIS — H35413 Lattice degeneration of retina, bilateral: Secondary | ICD-10-CM | POA: Diagnosis not present

## 2024-11-18 DIAGNOSIS — H5213 Myopia, bilateral: Secondary | ICD-10-CM | POA: Diagnosis not present

## 2024-11-18 LAB — OPHTHALMOLOGY REPORT-SCANNED

## 2024-12-01 ENCOUNTER — Emergency Department
Admission: EM | Admit: 2024-12-01 | Discharge: 2024-12-01 | Disposition: A | Discharge status: Home / Self Care | Source: Ambulatory Visit | Attending: Emergency Medicine | Admitting: Emergency Medicine

## 2024-12-01 ENCOUNTER — Emergency Department

## 2024-12-01 ENCOUNTER — Other Ambulatory Visit: Payer: Self-pay

## 2024-12-01 ENCOUNTER — Ambulatory Visit
Admission: RE | Admit: 2024-12-01 | Discharge: 2024-12-01 | Disposition: A | Discharge status: Home / Self Care | Source: Ambulatory Visit

## 2024-12-01 ENCOUNTER — Encounter: Payer: Self-pay | Admitting: Emergency Medicine

## 2024-12-01 VITALS — BP 123/78 | HR 71 | Temp 98.3°F | Resp 17

## 2024-12-01 DIAGNOSIS — R109 Unspecified abdominal pain: Secondary | ICD-10-CM | POA: Diagnosis present

## 2024-12-01 DIAGNOSIS — K8020 Calculus of gallbladder without cholecystitis without obstruction: Secondary | ICD-10-CM

## 2024-12-01 DIAGNOSIS — E119 Type 2 diabetes mellitus without complications: Secondary | ICD-10-CM | POA: Diagnosis not present

## 2024-12-01 DIAGNOSIS — R1011 Right upper quadrant pain: Secondary | ICD-10-CM | POA: Diagnosis not present

## 2024-12-01 LAB — LIPASE, BLOOD: Lipase: 29 U/L (ref 11–51)

## 2024-12-01 LAB — URINALYSIS, ROUTINE W REFLEX MICROSCOPIC
Bacteria, UA: NONE SEEN
Bilirubin Urine: NEGATIVE
Glucose, UA: NEGATIVE mg/dL
Ketones, ur: NEGATIVE mg/dL
Leukocytes,Ua: NEGATIVE
Nitrite: NEGATIVE
Protein, ur: NEGATIVE mg/dL
Specific Gravity, Urine: 1.021 (ref 1.005–1.030)
pH: 7 (ref 5.0–8.0)

## 2024-12-01 LAB — COMPREHENSIVE METABOLIC PANEL WITH GFR
ALT: 20 U/L (ref 0–44)
AST: 20 U/L (ref 15–41)
Albumin: 4.4 g/dL (ref 3.5–5.0)
Alkaline Phosphatase: 81 U/L (ref 38–126)
Anion gap: 10 (ref 5–15)
BUN: 14 mg/dL (ref 6–20)
CO2: 26 mmol/L (ref 22–32)
Calcium: 9.7 mg/dL (ref 8.9–10.3)
Chloride: 102 mmol/L (ref 98–111)
Creatinine, Ser: 0.95 mg/dL (ref 0.44–1.00)
GFR, Estimated: >60 mL/min (ref >60)
Glucose, Bld: 106 mg/dL — ABNORMAL HIGH (ref 70–99)
Potassium: 4.5 mmol/L (ref 3.5–5.1)
Sodium: 138 mmol/L (ref 135–145)
Total Bilirubin: 0.3 mg/dL (ref 0.0–1.2)
Total Protein: 7.2 g/dL (ref 6.5–8.1)

## 2024-12-01 LAB — CBC
HCT: 38.8 % (ref 36.0–46.0)
Hemoglobin: 12.1 g/dL (ref 12.0–15.0)
MCH: 22.4 pg — ABNORMAL LOW (ref 26.0–34.0)
MCHC: 31.2 g/dL (ref 30.0–36.0)
MCV: 72 fL — ABNORMAL LOW (ref 80.0–100.0)
Platelets: 404 K/uL — ABNORMAL HIGH (ref 150–400)
RBC: 5.39 MIL/uL — ABNORMAL HIGH (ref 3.87–5.11)
RDW: 15.6 % — ABNORMAL HIGH (ref 11.5–15.5)
WBC: 8.8 K/uL (ref 4.0–10.5)
nRBC: 0 % (ref 0.0–0.2)

## 2024-12-01 MED ORDER — OXYCODONE-ACETAMINOPHEN 5-325 MG PO TABS
1.0000 | ORAL_TABLET | ORAL | 0 refills | Status: AC | PRN
  Filled 2024-12-01: qty 12, 2d supply, fill #0

## 2024-12-01 MED ORDER — ONDANSETRON 4 MG PO TBDP
4.0000 mg | ORAL_TABLET | Freq: Three times a day (TID) | ORAL | 0 refills | Status: AC | PRN
  Filled 2024-12-01: qty 30, 10d supply, fill #0

## 2024-12-01 MED ORDER — ONDANSETRON 4 MG PO TBDP
4.0000 mg | ORAL_TABLET | Freq: Once | ORAL | Status: AC
  Administered 2024-12-01: 20:00:00 4 mg via ORAL
  Filled 2024-12-01: qty 1

## 2024-12-01 MED ORDER — OXYCODONE-ACETAMINOPHEN 5-325 MG PO TABS
1.0000 | ORAL_TABLET | Freq: Once | ORAL | Status: AC
  Administered 2024-12-01: 20:00:00 1 via ORAL
  Filled 2024-12-01: qty 1

## 2024-12-01 NOTE — ED Triage Notes (Signed)
 PT reports RUQ abdominal pain that warp around to her back. PT reports the pain is worse when eating or drinking. Has tried OTC medication with no relief.

## 2024-12-01 NOTE — Discharge Instructions (Addendum)
 Please follow up with PCP for further evaluation of possible gallbladder disease Avoid spicy,greasy fried foods Avoid caffeine and alcohol as this will aggravate your symptoms as well. If you have new or worsening abdominal pain, nausea not relieved by zofran , vomiting, fever, etc. go to the emergency room for further evaluation

## 2024-12-01 NOTE — Discharge Instructions (Addendum)
 You were seen in the emergency department and diagnosed with gallstones.  Your gallbladder did not appear infected.  Call Dr. Jordis first thing in the morning and he will likely be able to see you in his office tomorrow.  You are given information for gallbladder diet.  You are also given pain medication.  Pain control:  Ibuprofen (motrin/aleve/advil) - You can take 3 tablets (600 mg) every 6 hours as needed for pain/fever.  Acetaminophen  (tylenol ) - You can take 2 extra strength tablets (1000 mg) every 6 hours as needed for pain/fever.  You can alternate these medications or take them together.  Make sure you eat food/drink water when taking these medications.  You were given a prescription for narcotic pain medications.  Take only if in severe pain.  These are very addictive medications.  These medications can make you constipated.  If you need to take more than 1-2 doses, start a stool softner.  If you become constipated, take 1 capfull of MiraLAX, can repeat untill having regular bowel movements.  Keep this medication out of reach of any children. You cannot drive/work while taking this medication.  zofran  (ondansetron ) - nausea medication, take 1 tablet every 8 hours as needed for nausea/vomiting.

## 2024-12-01 NOTE — ED Notes (Signed)
Patient verbalizes understanding of discharge instructions. Opportunity for questioning and answers were provided. Armband removed by staff, pt discharged from ED. Ambulated out to lobby with family ? ?

## 2024-12-01 NOTE — ED Provider Notes (Signed)
 [facility] Provider Note    Event Date/Time   First MD Initiated Contact with Patient 12/01/24 1737     (approximate)   History   Abdominal Pain   HPI  Alejandra Watkins is a 48 y.o. female past medical history significant for hyperlipidemia, GERD, diabetes, presents to the emergency department with upper abdominal pain.  Patient states that she has been having intermittent episodes of right sided abdominal pain over the past couple of months.  Over the past week she has had more ongoing pain to her right upper abdomen that is worse after eating.  Also worse after drinking.  Does not matter what she eats or drinks.  Denies fever or chills.  Got antiemetics prior to arrival and that improved her symptoms.  No prior history of gastroparesis.  Denies any lower abdominal pain.  No unexplained weight loss.  No night sweats or fever.  Prior hysterectomy.  No prior bowel obstruction.     Physical Exam   Triage Vital Signs: ED Triage Vitals [12/01/24 1640]  Encounter Vitals Group     BP (!) 135/97     Girls Systolic BP Percentile      Girls Diastolic BP Percentile      Boys Systolic BP Percentile      Boys Diastolic BP Percentile      Pulse Rate 72     Resp 18     Temp 98.4 F (36.9 C)     Temp Source Oral     SpO2 100 %     Weight      Height      Head Circumference      Peak Flow      Pain Score 6     Pain Loc      Pain Education      Exclude from Growth Chart     Most recent vital signs: Vitals:   12/01/24 1758 12/01/24 1830  BP: 124/71 115/62  Pulse: 70 70  Resp: 18 18  Temp:    SpO2: 100% 100%    Physical Exam Constitutional:      Appearance: She is well-developed.  HENT:     Head: Atraumatic.  Eyes:     Conjunctiva/sclera: Conjunctivae normal.  Cardiovascular:     Rate and Rhythm: Regular rhythm.  Pulmonary:     Effort: No respiratory distress.  Abdominal:     General: There is no distension.     Tenderness: There is abdominal  tenderness in the right upper quadrant. Negative signs include Murphy's sign.  Musculoskeletal:        General: Normal range of motion.     Cervical back: Normal range of motion.  Skin:    General: Skin is warm.     Capillary Refill: Capillary refill takes less than 2 seconds.  Neurological:     General: No focal deficit present.     Mental Status: She is alert. Mental status is at baseline.     IMPRESSION / MDM / ASSESSMENT AND PLAN / ED COURSE  I reviewed the triage vital signs and the nursing notes.  Differential diagnosis including gastroparesis, gastritis/PUD, symptomatic cholelithiasis, acute cholecystitis, pancreatitis, malignancy     RADIOLOGY I independently reviewed imaging, my interpretation of imaging: Right upper quadrant ultrasound -bladder filled with gallstones and sludge, normal common bile duct  LABS (all labs ordered are listed, but only abnormal results are displayed) Labs interpreted as -    Labs Reviewed  COMPREHENSIVE METABOLIC PANEL  WITH GFR - Abnormal; Notable for the following components:      Result Value   Glucose, Bld 106 (*)    All other components within normal limits  CBC - Abnormal; Notable for the following components:   RBC 5.39 (*)    MCV 72.0 (*)    MCH 22.4 (*)    RDW 15.6 (*)    Platelets 404 (*)    All other components within normal limits  URINALYSIS, ROUTINE W REFLEX MICROSCOPIC - Abnormal; Notable for the following components:   Color, Urine YELLOW (*)    APPearance CLEAR (*)    Hgb urine dipstick SMALL (*)    All other components within normal limits  LIPASE, BLOOD     MDM  Patient with no significant leukocytosis or anemia.  Creatinine at baseline with no significant electrolyte abnormality.  Normal LFTs and T. bili.  Normal lipase.  Clinical Course as of 12/01/24 2015  Tue Dec 01, 2024  2005 Patient likely with symptomatic cholelithiasis.  Ultrasound with gallbladder that is filled with gallstones [SM]    Clinical  Course User Index [SM] Suzanne Kirsch, MD   Ultrasound with findings of symptomatic cholelithiasis but no findings of acute cholecystitis.  Patient given p.o. Percocet for pain control.  Reached out discussed the patient's case with general surgery Dr. Jordis, offered patient admission for elective cholecystectomy tomorrow versus outpatient follow-up this week.  Patient elected to follow-up as an outpatient this week.  Given pain medication and antiemetics.  Discussed gallbladder diet and return precautions.  No questions at time of discharge.  PROCEDURES:  Critical Care performed: No  Procedures  Patient's presentation is most consistent with acute presentation with potential threat to life or bodily function.   MEDICATIONS ORDERED IN ED: Medications  oxyCODONE -acetaminophen  (PERCOCET/ROXICET) 5-325 MG per tablet 1 tablet (1 tablet Oral Given 12/01/24 2004)  ondansetron  (ZOFRAN -ODT) disintegrating tablet 4 mg (4 mg Oral Given 12/01/24 2004)    FINAL CLINICAL IMPRESSION(S) / ED DIAGNOSES   Final diagnoses:  Calculus of gallbladder without cholecystitis without obstruction     Rx / DC Orders   ED Discharge Orders          Ordered    oxyCODONE -acetaminophen  (PERCOCET) 5-325 MG tablet  Every 4 hours PRN        12/01/24 2013    ondansetron  (ZOFRAN -ODT) 4 MG disintegrating tablet  Every 8 hours PRN        12/01/24 2013             Note:  This document was prepared using Dragon voice recognition software and may include unintentional dictation errors.   Suzanne Kirsch, MD 12/01/24 2015

## 2024-12-01 NOTE — ED Triage Notes (Signed)
 Pt states for a few months she has been getting pain in upper right abdomin after eating or drinking. Pain feels like gas and she becomes bloated and nauseous. She has tried OTC gas-x, pepto, and zofran  which haven't helped much.

## 2024-12-01 NOTE — ED Provider Notes (Signed)
 [facility]    CSN: [id number] Arrival date & time: 12/01/24  1437      History   Chief Complaint No chief complaint on file.   HPI Alejandra Watkins is a 48 y.o. female.   48 year old female, Alejandra Watkins, presents to urgent care for evaluation of right upper abdominal pain after eating or drinking for the past 5 months.  Patient states whenever she has the pain it feels like gas and she becomes bloated and nauseous. Patient has tried Gas-X, Pepto, Protonix  and Zofran  which have not provided much relief.  Patient reports being a pescatarian and does eat fried French fries, drinks coffee, occasional spicy food.  Patient reports after she takes her Zofran  and waits a few minutes the symptoms improve.  Patient is on Mounjaro    The history is provided by the patient. No language interpreter was used.    Past Medical History:  Diagnosis Date   Acid reflux    Allergy    Anemia    Diabetes mellitus without complication (HCC)    Family history of colon cancer    History of chicken pox    Leiomyoma    Ovarian cyst    seen at [facility]    Patient Active Problem List   Diagnosis Date Noted   RUQ pain 12/01/2024   Iron deficiency anemia 11/03/2021   Hyperlipidemia associated with type 2 diabetes mellitus (HCC) 07/19/2021   Class 2 obesity due to excess calories with body mass index (BMI) of 36.0 to 36.9 in adult 07/14/2021   GERD (gastroesophageal reflux disease) 06/27/2017   DM (diabetes mellitus), type 2 (HCC) 03/30/2015    Past Surgical History:  Procedure Laterality Date   ABDOMINAL HYSTERECTOMY     BREAST BIOPSY Left 15 yrs ago   needle biopsy. Benign   COLONOSCOPY WITH PROPOFOL  N/A 09/09/2020   Procedure: COLONOSCOPY WITH PROPOFOL ;  Surgeon: Janalyn Keene NOVAK, MD;  Location: [facility] ENDOSCOPY;  Service: Endoscopy;  Laterality: N/A;   LAPAROSCOPIC SUPRACERVICAL HYSTERECTOMY  2013   OVARIAN CYST REMOVAL Left    TUBAL LIGATION      OB History     Gravida  3    Para  3   Term  3   Preterm      AB      Living  3      SAB      IAB      Ectopic      Multiple      Live Births  3            Home Medications    Prior to Admission medications  Medication Sig Start Date End Date Taking? Authorizing Provider  atorvastatin  (LIPITOR) 10 MG tablet Take 1 tablet (10 mg total) by mouth daily. 08/31/24   Antonette Angeline ORN, NP  azelastine  (ASTELIN ) 0.1 % nasal spray Place 1-2 sprays into both nostrils 2 (two) times daily. 09/12/23     azelastine  (OPTIVAR ) 0.05 % ophthalmic solution Place 1 drop into affected eye(s) 2 (two) times daily. 09/12/23     Cholecalciferol (VITAMIN D3) 5000 units CAPS Take 1 capsule by mouth daily.    [provider]  clotrimazole -betamethasone  (LOTRISONE ) cream Apply externally twice daily as needed for up to 2 weeks 04/27/24   Copland, Alicia B, PA-C  estradiol  (ESTRACE  VAGINAL) 0.1 MG/GM vaginal cream Insert 1 g vaginally nightly for 7 nights, then 1-2 times weekly as maintenance 07/27/24   Copland, Alicia B, PA-C  glucose blood (BAYER  CONTOUR NEXT TEST) test strip 1 each by Other route 2 (two) times daily as needed for other. Use as instructed 03/17/19   Antonette Angeline ORN, NP  levocetirizine (XYZAL ) 5 MG tablet Take 1 tablet (5 mg total) by mouth daily. 10/29/23     levonorgestrel -ethinyl estradiol  (ALESSE) 0.1-20 MG-MCG tablet Take 1 tablet by mouth daily continuously 04/27/24 01/20/25  Copland, Alicia B, PA-C  montelukast  (SINGULAIR ) 10 MG tablet Take 1 tablet (10 mg total) by mouth daily. 10/29/23     pantoprazole  (PROTONIX ) 20 MG tablet Take 1 tablet (20 mg total) by mouth daily. 09/16/24   Antonette Angeline ORN, NP  PARoxetine  (PAXIL ) 10 MG tablet Take 1 tablet (10 mg total) by mouth daily. 10/07/24   Antonette Angeline ORN, NP  Safety Seal Miscellaneous MISC Clobetasol USP 7% Finasteride USP 0.1% Metformin 10% solution  - apply to affected areas of the scalp every morning. 09/23/24   Alm Delon SAILOR, DO  tirzepatide   (MOUNJARO ) 2.5 MG/0.5ML Pen Inject 2.5 mg into the skin once a week. 08/13/24   Antonette Angeline ORN, NP  triamcinolone  (NASACORT  ALLERGY 24HR) 55 MCG/ACT AERO nasal inhaler 1 spray in each nostril Nasally Once a day for 30 days    [provider]    Family History Family History  Problem Relation Age of Onset   Hypertension Mother    Alcoholism Father    Alcohol abuse Father    Cancer Father    Hypertension Paternal Grandmother    Diabetes Paternal Grandmother        type 2   Lung cancer Paternal Uncle    Cancer Paternal Uncle    Colon cancer Other    Diabetes Other        type 2   Hypertension Other    Diabetes Other        type 2   Stroke Other    Cancer Paternal Uncle    Diabetes Maternal Aunt    Hypertension Maternal Aunt    Diabetes Maternal Uncle    Diabetes Maternal Aunt    Hypertension Maternal Aunt    ADD / ADHD Son    Heart disease Neg Hx    Breast cancer Neg Hx     Social History Social History[1]   Allergies   Minoxidil and No known allergies   Review of Systems Review of Systems  Constitutional:  Negative for fever.  Respiratory:  Negative for cough.   Gastrointestinal:  Positive for abdominal pain and nausea.       Bloating  All other systems reviewed and are negative.    Physical Exam Triage Vital Signs ED Triage Vitals  Encounter Vitals Group     BP      Girls Systolic BP Percentile      Girls Diastolic BP Percentile      Boys Systolic BP Percentile      Boys Diastolic BP Percentile      Pulse      Resp      Temp      Temp src      SpO2      Weight      Height      Head Circumference      Peak Flow      Pain Score      Pain Loc      Pain Education      Exclude from Growth Chart    No data found.  Updated Vital Signs BP 123/78 (BP Location: Right  Arm)   Pulse 71   Temp 98.3 F (36.8 C) (Oral)   Resp 17   LMP  (LMP Unknown)   SpO2 100%   Visual Acuity Right Eye Distance:   Left Eye Distance:   Bilateral  Distance:    Right Eye Near:   Left Eye Near:    Bilateral Near:     Physical Exam Vitals and nursing note reviewed.  Constitutional:      General: She is not in acute distress.    Appearance: She is well-developed.  HENT:     Head: Normocephalic and atraumatic.  Eyes:     Conjunctiva/sclera: Conjunctivae normal.  Cardiovascular:     Rate and Rhythm: Normal rate.     Heart sounds: No murmur heard. Pulmonary:     Effort: Pulmonary effort is normal. No respiratory distress.  Abdominal:     General: Bowel sounds are normal.     Palpations: Abdomen is soft.     Tenderness: There is abdominal tenderness in the right upper quadrant. There is rebound. There is no guarding.  Musculoskeletal:        General: No swelling.     Cervical back: Neck supple.  Skin:    General: Skin is warm and dry.     Capillary Refill: Capillary refill takes less than 2 seconds.  Neurological:     General: No focal deficit present.     Mental Status: She is alert and oriented to person, place, and time.     GCS: GCS eye subscore is 4. GCS verbal subscore is 5. GCS motor subscore is 6.  Psychiatric:        Attention and Perception: Attention normal.        Mood and Affect: Mood normal.        Speech: Speech normal.        Behavior: Behavior normal. Behavior is cooperative.      UC Treatments / Results  Labs (all labs ordered are listed, but only abnormal results are displayed) Labs Reviewed - No data to display  EKG   Radiology No results found.  Procedures Procedures (including critical care time)  Medications Ordered in UC Medications - No data to display  Initial Impression / Assessment and Plan / UC Course  I have reviewed the triage vital signs and the nursing notes.  Pertinent labs & imaging results that were available during my care of the patient were reviewed by me and considered in my medical decision making (see chart for details).    Discussed exam findings and plan of  care with patient, patient is afebrile in no acute distress in office , symptoms have been ongoing intermittently for 5 months , recommend patient have further workup for possible gallbladder disease (labs and ultrasound), discussed avoiding caffeine , spicy/ greasy/ fried foods and alcohol , patient has appointment with PCP in 2 days , strict go to ER precautions given.   Patient verbalized understanding to this provider.  Ddx: Right upper quadrant abdominal pain, gallbladder disease, pancreatitis, GERD, side effect from Mounjaro  use , appendicitis Final Clinical Impressions(s) / UC Diagnoses   Final diagnoses:  RUQ pain     Discharge Instructions      Please follow up with PCP for further evaluation of possible gallbladder disease Avoid spicy,greasy fried foods Avoid caffeine and alcohol as this will aggravate your symptoms as well. If you have new or worsening abdominal pain, nausea not relieved by zofran , vomiting, fever, etc. go to the emergency room  for further evaluation     ED Prescriptions   None    PDMP not reviewed this encounter.     [1]  Social History Tobacco Use   Smoking status: Never   Smokeless tobacco: Never  Vaping Use   Vaping status: Never Used  Substance Use Topics   Alcohol use: Yes    Alcohol/week: 1.0 standard drink of alcohol    Comment: occasional   Drug use: No     Aminta Loose, NP 12/01/24 1713

## 2024-12-02 ENCOUNTER — Telehealth: Payer: Self-pay | Admitting: Surgery

## 2024-12-02 ENCOUNTER — Ambulatory Visit: Admitting: Surgery

## 2024-12-02 ENCOUNTER — Encounter: Payer: Self-pay | Admitting: Surgery

## 2024-12-02 ENCOUNTER — Other Ambulatory Visit: Payer: Self-pay

## 2024-12-02 VITALS — BP 112/75 | HR 75 | Temp 98.7°F | Ht 64.0 in | Wt 208.0 lb

## 2024-12-02 DIAGNOSIS — K8020 Calculus of gallbladder without cholecystitis without obstruction: Secondary | ICD-10-CM

## 2024-12-02 DIAGNOSIS — K8012 Calculus of gallbladder with acute and chronic cholecystitis without obstruction: Secondary | ICD-10-CM | POA: Diagnosis not present

## 2024-12-02 NOTE — Progress Notes (Addendum)
 Patient ID: Alejandra Watkins, female   DOB: 05/12/1976, 48 y.o.   MRN: [id number]  HPI Alejandra Watkins is a 48 y.o. female seen in consultation at the request of Dr. Suzanne.  She presented to the emergency room last night complaining of worsening abdominal pain located in the epigastrium and right upper quadrant radiated to the right upper quadrant.  Pain is sharp moderate to severe worsening with meals. No other specific alleviating factors.  She has had episodes for over the last couple months but has intensified over the last week or so.  She is diabetic.  She did have 2 prior pelvic surgeries including hysterectomy and ovarian cyst removal.  While gynecological surgery was performed there was inadvertent bowel injury that was repaired at that time.  She has been told that she has significant scar tissue within the pelvis.  She did have an ultrasound that I personally reviewed showing evidence of cholelithiasis without definitive signs of cholecystitis.  Her LFTs CBC were normal.  Except thrombocytosis.  she is able to perform more than 4 METS of activity without shortness of breath or chest pain.  She is taking GLP-1 and last dose was 4 days ago.  HPI  Past Medical History:  Diagnosis Date   Acid reflux    Allergy    Anemia    Diabetes mellitus without complication (HCC)    Family history of colon cancer    History of chicken pox    Leiomyoma    Ovarian cyst    seen at [facility]    Past Surgical History:  Procedure Laterality Date   ABDOMINAL HYSTERECTOMY     BREAST BIOPSY Left 15 yrs ago   needle biopsy. Benign   COLONOSCOPY WITH PROPOFOL  N/A 09/09/2020   Procedure: COLONOSCOPY WITH PROPOFOL ;  Surgeon: Janalyn Keene NOVAK, MD;  Location: [facility] ENDOSCOPY;  Service: Endoscopy;  Laterality: N/A;   LAPAROSCOPIC SUPRACERVICAL HYSTERECTOMY  2013   OVARIAN CYST REMOVAL Left    TUBAL LIGATION      Family History  Problem Relation Age of Onset   Hypertension Mother    Alcoholism Father     Alcohol abuse Father    Cancer Father    Hypertension Paternal Grandmother    Diabetes Paternal Grandmother        type 2   Lung cancer Paternal Uncle    Cancer Paternal Uncle    Colon cancer Other    Diabetes Other        type 2   Hypertension Other    Diabetes Other        type 2   Stroke Other    Cancer Paternal Uncle    Diabetes Maternal Aunt    Hypertension Maternal Aunt    Diabetes Maternal Uncle    Diabetes Maternal Aunt    Hypertension Maternal Aunt    ADD / ADHD Son    Heart disease Neg Hx    Breast cancer Neg Hx     Social History Social History[1]  Allergies[2]  Current Outpatient Medications  Medication Sig Dispense Refill   atorvastatin  (LIPITOR) 10 MG tablet Take 1 tablet (10 mg total) by mouth daily. 90 tablet 1   azelastine  (ASTELIN ) 0.1 % nasal spray Place 1-2 sprays into both nostrils 2 (two) times daily. 30 mL 6   azelastine  (OPTIVAR ) 0.05 % ophthalmic solution Place 1 drop into affected eye(s) 2 (two) times daily. 18 mL 2   Cholecalciferol (VITAMIN D3) 5000 units CAPS Take 1 capsule by mouth  daily.     clotrimazole -betamethasone  (LOTRISONE ) cream Apply externally twice daily as needed for up to 2 weeks 30 g 0   estradiol  (ESTRACE  VAGINAL) 0.1 MG/GM vaginal cream Insert 1 g vaginally nightly for 7 nights, then 1-2 times weekly as maintenance 42.5 g 1   glucose blood (BAYER CONTOUR NEXT TEST) test strip 1 each by Other route 2 (two) times daily as needed for other. Use as instructed 200 each 2   levocetirizine (XYZAL ) 5 MG tablet Take 1 tablet (5 mg total) by mouth daily. 90 tablet 3   levonorgestrel -ethinyl estradiol  (ALESSE) 0.1-20 MG-MCG tablet Take 1 tablet by mouth daily continuously 84 tablet 4   montelukast  (SINGULAIR ) 10 MG tablet Take 1 tablet (10 mg total) by mouth daily. 90 tablet 3   ondansetron  (ZOFRAN -ODT) 4 MG disintegrating tablet Take 1 tablet (4 mg total) by mouth every 8 (eight) hours as needed for nausea or vomiting. 30 tablet 0    oxyCODONE -acetaminophen  (PERCOCET) 5-325 MG tablet Take 1 tablet by mouth every 4 (four) hours as needed for up to 3 days for severe pain (pain score 7-10). 12 tablet 0   pantoprazole  (PROTONIX ) 20 MG tablet Take 1 tablet (20 mg total) by mouth daily. 90 tablet 1   Safety Seal Miscellaneous MISC Clobetasol USP 7% Finasteride USP 0.1% Metformin 10% solution  - apply to affected areas of the scalp every morning. 30 g 5   tirzepatide  (MOUNJARO ) 2.5 MG/0.5ML Pen Inject 2.5 mg into the skin once a week. 6 mL 1   triamcinolone  (NASACORT  ALLERGY 24HR) 55 MCG/ACT AERO nasal inhaler 1 spray in each nostril Nasally Once a day for 30 days     No current facility-administered medications for this visit.     Review of Systems Full ROS  was asked and was negative except for the information on the HPI  Physical Exam Blood pressure 112/75, pulse 75, temperature 98.7 F (37.1 C), temperature source Oral, height 5' 4 (1.626 m), weight 208 lb (94.3 kg), SpO2 97%. CONSTITUTIONAL: NAD. EYES: Pupils are equal, round, Sclera are non-icteric. EARS, NOSE, MOUTH AND THROAT: The oropharynx is clear. The oral mucosa is pink and moist. Hearing is intact to voice. LYMPH NODES:  Lymph nodes in the neck are normal. RESPIRATORY:  Lungs are clear. There is normal respiratory effort, with equal breath sounds bilaterally, and without pathologic use of accessory muscles. CARDIOVASCULAR: Heart is regular without murmurs, gallops, or rubs. GI: The abdomen is  soft, tender to palpation in epigastrium and right upper quadrant with positive Murphy sign no peritonitis.  Pfannenstiel scar. there are no palpable masses. There is no hepatosplenomegaly. There are normal bowel sounds GU: Rectal deferred.   MUSCULOSKELETAL: Normal muscle strength and tone. No cyanosis or edema.   SKIN: Turgor is good and there are no pathologic skin lesions or ulcers. NEUROLOGIC: Motor and sensation is grossly normal. Cranial nerves are grossly  intact. PSYCH:  Oriented to person, place and time. Affect is normal.  Data Reviewed I have personally reviewed the patient's imaging, laboratory findings and medical records.    Assessment/Plan 48 year old female findings consistent with acute on chronic cholecystitis.  Discussed with the patient and family in detail about her disease process.  She does have prior surgical history with adhesions.  Despite this I do think that cholecystectomy is indicated in a urgent fashion given her cholecystitis.  She does take GLP-1 but I do think that despite this we will need to proceed with surgical intervention. Will go ahead  and get her and schedule for tomorrow  The risks, benefits, complications, treatment options, and expected outcomes were discussed with the patient. The possibilities of bleeding, recurrent infection, finding a normal gallbladder, perforation of viscus organs, damage to surrounding structures, bile leak, abscess formation, needing a drain placed, the need for additional procedures, reaction to medication, pulmonary aspiration,  failure to diagnose a condition, the possible need to convert to an open procedure, and creating a complication requiring transfusion or operation were discussed with the patient. The patient and/or family concurred with the proposed plan, giving informed consent.  I personally spent a total of 55 minutes in the care of the patient today including performing a medically appropriate exam/evaluation, counseling and educating, placing orders, referring and communicating with other health care professionals, documenting clinical information in the EHR, independently interpreting and reviewing images studies and coordinating care.   A copy of the report was sent to the referring provider Laneta Luna, MD FACS General Surgeon 12/02/2024, 11:18 AM      [1]  Social History Tobacco Use   Smoking status: Never   Smokeless tobacco: Never  Vaping Use   Vaping  status: Never Used  Substance Use Topics   Alcohol use: Yes    Alcohol/week: 1.0 standard drink of alcohol    Comment: occasional   Drug use: No  [2]  Allergies Allergen Reactions   Minoxidil    No Known Allergies

## 2024-12-02 NOTE — H&P (View-Only) (Signed)
 Patient ID: Alejandra Watkins, female   DOB: 05/12/1976, 48 y.o.   MRN: [id number]  HPI Alejandra Watkins is a 48 y.o. female seen in consultation at the request of Dr. Suzanne.  She presented to the emergency room last night complaining of worsening abdominal pain located in the epigastrium and right upper quadrant radiated to the right upper quadrant.  Pain is sharp moderate to severe worsening with meals. No other specific alleviating factors.  She has had episodes for over the last couple months but has intensified over the last week or so.  She is diabetic.  She did have 2 prior pelvic surgeries including hysterectomy and ovarian cyst removal.  While gynecological surgery was performed there was inadvertent bowel injury that was repaired at that time.  She has been told that she has significant scar tissue within the pelvis.  She did have an ultrasound that I personally reviewed showing evidence of cholelithiasis without definitive signs of cholecystitis.  Her LFTs CBC were normal.  Except thrombocytosis.  she is able to perform more than 4 METS of activity without shortness of breath or chest pain.  She is taking GLP-1 and last dose was 4 days ago.  HPI  Past Medical History:  Diagnosis Date   Acid reflux    Allergy    Anemia    Diabetes mellitus without complication (HCC)    Family history of colon cancer    History of chicken pox    Leiomyoma    Ovarian cyst    seen at [facility]    Past Surgical History:  Procedure Laterality Date   ABDOMINAL HYSTERECTOMY     BREAST BIOPSY Left 15 yrs ago   needle biopsy. Benign   COLONOSCOPY WITH PROPOFOL  N/A 09/09/2020   Procedure: COLONOSCOPY WITH PROPOFOL ;  Surgeon: Janalyn Keene NOVAK, MD;  Location: [facility] ENDOSCOPY;  Service: Endoscopy;  Laterality: N/A;   LAPAROSCOPIC SUPRACERVICAL HYSTERECTOMY  2013   OVARIAN CYST REMOVAL Left    TUBAL LIGATION      Family History  Problem Relation Age of Onset   Hypertension Mother    Alcoholism Father     Alcohol abuse Father    Cancer Father    Hypertension Paternal Grandmother    Diabetes Paternal Grandmother        type 2   Lung cancer Paternal Uncle    Cancer Paternal Uncle    Colon cancer Other    Diabetes Other        type 2   Hypertension Other    Diabetes Other        type 2   Stroke Other    Cancer Paternal Uncle    Diabetes Maternal Aunt    Hypertension Maternal Aunt    Diabetes Maternal Uncle    Diabetes Maternal Aunt    Hypertension Maternal Aunt    ADD / ADHD Son    Heart disease Neg Hx    Breast cancer Neg Hx     Social History Social History[1]  Allergies[2]  Current Outpatient Medications  Medication Sig Dispense Refill   atorvastatin  (LIPITOR) 10 MG tablet Take 1 tablet (10 mg total) by mouth daily. 90 tablet 1   azelastine  (ASTELIN ) 0.1 % nasal spray Place 1-2 sprays into both nostrils 2 (two) times daily. 30 mL 6   azelastine  (OPTIVAR ) 0.05 % ophthalmic solution Place 1 drop into affected eye(s) 2 (two) times daily. 18 mL 2   Cholecalciferol (VITAMIN D3) 5000 units CAPS Take 1 capsule by mouth  daily.     clotrimazole -betamethasone  (LOTRISONE ) cream Apply externally twice daily as needed for up to 2 weeks 30 g 0   estradiol  (ESTRACE  VAGINAL) 0.1 MG/GM vaginal cream Insert 1 g vaginally nightly for 7 nights, then 1-2 times weekly as maintenance 42.5 g 1   glucose blood (BAYER CONTOUR NEXT TEST) test strip 1 each by Other route 2 (two) times daily as needed for other. Use as instructed 200 each 2   levocetirizine (XYZAL ) 5 MG tablet Take 1 tablet (5 mg total) by mouth daily. 90 tablet 3   levonorgestrel -ethinyl estradiol  (ALESSE) 0.1-20 MG-MCG tablet Take 1 tablet by mouth daily continuously 84 tablet 4   montelukast  (SINGULAIR ) 10 MG tablet Take 1 tablet (10 mg total) by mouth daily. 90 tablet 3   ondansetron  (ZOFRAN -ODT) 4 MG disintegrating tablet Take 1 tablet (4 mg total) by mouth every 8 (eight) hours as needed for nausea or vomiting. 30 tablet 0    oxyCODONE -acetaminophen  (PERCOCET) 5-325 MG tablet Take 1 tablet by mouth every 4 (four) hours as needed for up to 3 days for severe pain (pain score 7-10). 12 tablet 0   pantoprazole  (PROTONIX ) 20 MG tablet Take 1 tablet (20 mg total) by mouth daily. 90 tablet 1   Safety Seal Miscellaneous MISC Clobetasol USP 7% Finasteride USP 0.1% Metformin 10% solution  - apply to affected areas of the scalp every morning. 30 g 5   tirzepatide  (MOUNJARO ) 2.5 MG/0.5ML Pen Inject 2.5 mg into the skin once a week. 6 mL 1   triamcinolone  (NASACORT  ALLERGY 24HR) 55 MCG/ACT AERO nasal inhaler 1 spray in each nostril Nasally Once a day for 30 days     No current facility-administered medications for this visit.     Review of Systems Full ROS  was asked and was negative except for the information on the HPI  Physical Exam Blood pressure 112/75, pulse 75, temperature 98.7 F (37.1 C), temperature source Oral, height 5' 4 (1.626 m), weight 208 lb (94.3 kg), SpO2 97%. CONSTITUTIONAL: NAD. EYES: Pupils are equal, round, Sclera are non-icteric. EARS, NOSE, MOUTH AND THROAT: The oropharynx is clear. The oral mucosa is pink and moist. Hearing is intact to voice. LYMPH NODES:  Lymph nodes in the neck are normal. RESPIRATORY:  Lungs are clear. There is normal respiratory effort, with equal breath sounds bilaterally, and without pathologic use of accessory muscles. CARDIOVASCULAR: Heart is regular without murmurs, gallops, or rubs. GI: The abdomen is  soft, tender to palpation in epigastrium and right upper quadrant with positive Murphy sign no peritonitis.  Pfannenstiel scar. there are no palpable masses. There is no hepatosplenomegaly. There are normal bowel sounds GU: Rectal deferred.   MUSCULOSKELETAL: Normal muscle strength and tone. No cyanosis or edema.   SKIN: Turgor is good and there are no pathologic skin lesions or ulcers. NEUROLOGIC: Motor and sensation is grossly normal. Cranial nerves are grossly  intact. PSYCH:  Oriented to person, place and time. Affect is normal.  Data Reviewed I have personally reviewed the patient's imaging, laboratory findings and medical records.    Assessment/Plan 48 year old female findings consistent with acute on chronic cholecystitis.  Discussed with the patient and family in detail about her disease process.  She does have prior surgical history with adhesions.  Despite this I do think that cholecystectomy is indicated in a urgent fashion given her cholecystitis.  She does take GLP-1 but I do think that despite this we will need to proceed with surgical intervention. Will go ahead  and get her and schedule for tomorrow  The risks, benefits, complications, treatment options, and expected outcomes were discussed with the patient. The possibilities of bleeding, recurrent infection, finding a normal gallbladder, perforation of viscus organs, damage to surrounding structures, bile leak, abscess formation, needing a drain placed, the need for additional procedures, reaction to medication, pulmonary aspiration,  failure to diagnose a condition, the possible need to convert to an open procedure, and creating a complication requiring transfusion or operation were discussed with the patient. The patient and/or family concurred with the proposed plan, giving informed consent.  I personally spent a total of 55 minutes in the care of the patient today including performing a medically appropriate exam/evaluation, counseling and educating, placing orders, referring and communicating with other health care professionals, documenting clinical information in the EHR, independently interpreting and reviewing images studies and coordinating care.   A copy of the report was sent to the referring provider Laneta Luna, MD FACS General Surgeon 12/02/2024, 11:18 AM      [1]  Social History Tobacco Use   Smoking status: Never   Smokeless tobacco: Never  Vaping Use   Vaping  status: Never Used  Substance Use Topics   Alcohol use: Yes    Alcohol/week: 1.0 standard drink of alcohol    Comment: occasional   Drug use: No  [2]  Allergies Allergen Reactions   Minoxidil    No Known Allergies

## 2024-12-02 NOTE — Telephone Encounter (Signed)
 Patient has been advised of Pre-Admission date/time, and Surgery date at [facility].  Surgery Date: 12/03/24 Preadmission Testing Date: 2 hours early, patient informed to arrive between 12:00 to 12:30 pm 12/03/24 @ [facility].   Patient also reminded to be NPO after midnight.

## 2024-12-02 NOTE — Patient Instructions (Signed)
 You have requested to have your gallbladder removed. This will be done at [facility] with Dr. Jordis.   If you are on any injectable weight loss medication, you will need to stop taking your GLP-1 injectable (weight loss) medications 8 days before your surgery to avoid any complications with anesthesia.    You will most likely be out of work 1-2 weeks for this surgery.  If you have FMLA or disability paperwork that needs filled out you may drop this off at our office or this can be faxed to [phone].   You will return after your post-op appointment with a lifting restriction for approximately 4 more weeks.   You will be able to eat anything you would like to following surgery. But, start by eating a bland diet and advance this as tolerated. The Gallbladder diet is below, please go as closely by this diet as possible prior to surgery to avoid any further attacks.   Please see the (blue)pre-care form that you have been given today. You will receive several phone calls from [facility] prior to your surgery date.   If you have any questions, please call our office.   Laparoscopic Cholecystectomy Laparoscopic cholecystectomy is surgery to remove the gallbladder. The gallbladder is located in the upper right part of the abdomen, behind the liver. It is a storage sac for bile, which is produced in the liver. Bile aids in the digestion and absorption of fats. Cholecystectomy is often done for inflammation of the gallbladder (cholecystitis). This condition is usually caused by a buildup of gallstones (cholelithiasis) in the gallbladder. Gallstones can block the flow of bile, and that can result in inflammation and pain. In severe cases, emergency surgery may be required. If emergency surgery is not required, you will have time to prepare for the procedure. Laparoscopic surgery is an alternative to open surgery. Laparoscopic surgery has a shorter recovery time. Your common bile duct may also need to be  examined during the procedure. If stones are found in the common bile duct, they may be removed. LET [facility] CARE PROVIDER KNOW ABOUT: Any allergies you have. All medicines you are taking, including vitamins, herbs, eye drops, creams, and over-the-counter medicines. Previous problems you or members of your family have had with the use of anesthetics. Any blood disorders you have. Previous surgeries you have had.  Any medical conditions you have. RISKS AND COMPLICATIONS Generally, this is a safe procedure. However, problems may occur, including: Infection. Bleeding. Allergic reactions to medicines. Damage to other structures or organs. A stone remaining in the common bile duct. A bile leak from the cyst duct that is clipped when your gallbladder is removed. The need to convert to open surgery, which requires a larger incision in the abdomen. This may be necessary if your surgeon thinks that it is not safe to continue with a laparoscopic procedure. BEFORE THE PROCEDURE Ask your health care provider about: Changing or stopping your regular medicines. This is especially important if you are taking diabetes medicines or blood thinners. Taking medicines such as aspirin and ibuprofen. These medicines can thin your blood. Do not take these medicines before your procedure if your health care provider instructs you not to. Follow instructions from your health care provider about eating or drinking restrictions. Let your health care provider know if you develop a cold or an infection before surgery. Plan to have someone take you home after the procedure. Ask your health care provider how your surgical site will be marked  or identified. You may be given antibiotic medicine to help prevent infection. PROCEDURE To reduce your risk of infection: Your health care team will wash or sanitize their hands. Your skin will be washed with soap. An IV tube may be inserted into one of your veins. You will  be given a medicine to make you fall asleep (general anesthetic). A breathing tube will be placed in your mouth. The surgeon will make several small cuts (incisions) in your abdomen. A thin, lighted tube (laparoscope) that has a tiny camera on the end will be inserted through one of the small incisions. The camera on the laparoscope will send a picture to a TV screen (monitor) in the operating room. This will give the surgeon a good view inside your abdomen. A gas will be pumped into your abdomen. This will expand your abdomen to give the surgeon more room to perform the surgery. Other tools that are needed for the procedure will be inserted through the other incisions. The gallbladder will be removed through one of the incisions. After your gallbladder has been removed, the incisions will be closed with stitches (sutures), staples, or skin glue. Your incisions may be covered with a bandage (dressing). The procedure may vary among health care providers and hospitals. AFTER THE PROCEDURE Your blood pressure, heart rate, breathing rate, and blood oxygen level will be monitored often until the medicines you were given have worn off. You will be given medicines as needed to control your pain.   This information is not intended to replace advice given to you by your health care provider. Make sure you discuss any questions you have with your health care provider.   Document Released: 12/03/2005 Document Revised: 08/24/2015 Document Reviewed: 07/15/2013 Elsevier Interactive Patient Education 2016 [employer] Inc[employer]     Low-Fat Diet for Gallbladder Conditions A low-fat diet can be helpful if you have pancreatitis or a gallbladder condition. With these conditions, your pancreas and gallbladder have trouble digesting fats. A healthy eating plan with less fat will help rest your pancreas and gallbladder and reduce your symptoms. WHAT DO I NEED TO KNOW ABOUT THIS DIET? Eat a low-fat diet. Reduce your fat  intake to less than 20-30% of your total daily calories. This is less than 50-60 g of fat per day. Remember that you need some fat in your diet. Ask your dietician what your daily goal should be. Choose nonfat and low-fat healthy foods. Look for the words nonfat, low fat, or fat free. As a guide, look on the label and choose foods with less than 3 g of fat per serving. Eat only one serving. Avoid alcohol. Do not smoke. If you need help quitting, talk with your health care provider. Eat small frequent meals instead of three large heavy meals. WHAT FOODS CAN I EAT? Grains Include healthy grains and starches such as potatoes, wheat bread, fiber-rich cereal, and brown rice. Choose whole grain options whenever possible. In adults, whole grains should account for 45-65% of your daily calories.  Fruits and Vegetables Eat plenty of fruits and vegetables. Fresh fruits and vegetables add fiber to your diet. Meats and Other Protein Sources Eat lean meat such as chicken and pork. Trim any fat off of meat before cooking it. Eggs, fish, and beans are other sources of protein. In adults, these foods should account for 10-35% of your daily calories. Dairy Choose low-fat milk and dairy options. Dairy includes fat and protein, as well as calcium .  Fats and Oils Limit high-fat foods  such as fried foods, sweets, baked goods, sugary drinks.  Other Creamy sauces and condiments, such as mayonnaise, can add extra fat. Think about whether or not you need to use them, or use smaller amounts or low fat options. WHAT FOODS ARE NOT RECOMMENDED? High fat foods, such as: [employer]. Ice cream. French toast. Sweet rolls. Pizza. Cheese bread. Foods covered with batter, butter, creamy sauces, or cheese. Fried foods. Sugary drinks and desserts. Foods that cause gas or bloating   This information is not intended to replace advice given to you by your health care provider. Make sure you discuss any questions you  have with your health care provider.   Document Released: 12/08/2013 Document Reviewed: 12/08/2013 Elsevier Interactive Patient Education [employer]

## 2024-12-03 ENCOUNTER — Other Ambulatory Visit: Payer: Self-pay

## 2024-12-03 ENCOUNTER — Ambulatory Visit: Admitting: Anesthesiology

## 2024-12-03 ENCOUNTER — Encounter: Payer: Self-pay | Admitting: Surgery

## 2024-12-03 ENCOUNTER — Encounter
Admission: RE | Disposition: A | Discharge status: Home / Self Care | Payer: Self-pay | Source: Home / Self Care | Attending: Surgery

## 2024-12-03 ENCOUNTER — Ambulatory Visit
Admission: RE | Admit: 2024-12-03 | Discharge: 2024-12-03 | Disposition: A | Discharge status: Home / Self Care | Attending: Surgery | Admitting: Surgery

## 2024-12-03 DIAGNOSIS — K8010 Calculus of gallbladder with chronic cholecystitis without obstruction: Secondary | ICD-10-CM | POA: Diagnosis not present

## 2024-12-03 DIAGNOSIS — E66812 Obesity, class 2: Secondary | ICD-10-CM | POA: Diagnosis not present

## 2024-12-03 DIAGNOSIS — D509 Iron deficiency anemia, unspecified: Secondary | ICD-10-CM | POA: Diagnosis not present

## 2024-12-03 DIAGNOSIS — D75839 Thrombocytosis, unspecified: Secondary | ICD-10-CM | POA: Insufficient documentation

## 2024-12-03 DIAGNOSIS — Z7984 Long term (current) use of oral hypoglycemic drugs: Secondary | ICD-10-CM | POA: Insufficient documentation

## 2024-12-03 DIAGNOSIS — Z9889 Other specified postprocedural states: Secondary | ICD-10-CM | POA: Insufficient documentation

## 2024-12-03 DIAGNOSIS — Z79899 Other long term (current) drug therapy: Secondary | ICD-10-CM | POA: Diagnosis not present

## 2024-12-03 DIAGNOSIS — K219 Gastro-esophageal reflux disease without esophagitis: Secondary | ICD-10-CM | POA: Insufficient documentation

## 2024-12-03 DIAGNOSIS — K8020 Calculus of gallbladder without cholecystitis without obstruction: Secondary | ICD-10-CM

## 2024-12-03 DIAGNOSIS — E785 Hyperlipidemia, unspecified: Secondary | ICD-10-CM | POA: Diagnosis not present

## 2024-12-03 DIAGNOSIS — Z7985 Long-term (current) use of injectable non-insulin antidiabetic drugs: Secondary | ICD-10-CM | POA: Insufficient documentation

## 2024-12-03 DIAGNOSIS — E119 Type 2 diabetes mellitus without complications: Secondary | ICD-10-CM | POA: Insufficient documentation

## 2024-12-03 DIAGNOSIS — K8012 Calculus of gallbladder with acute and chronic cholecystitis without obstruction: Secondary | ICD-10-CM | POA: Diagnosis not present

## 2024-12-03 DIAGNOSIS — Z6835 Body mass index (BMI) 35.0-35.9, adult: Secondary | ICD-10-CM | POA: Diagnosis not present

## 2024-12-03 LAB — GLUCOSE, CAPILLARY
Glucose-Capillary: 84 mg/dL (ref 70–99)
Glucose-Capillary: 96 mg/dL (ref 70–99)

## 2024-12-03 SURGERY — CHOLECYSTECTOMY, ROBOT-ASSISTED, LAPAROSCOPIC
Anesthesia: General | Site: Abdomen

## 2024-12-03 MED ORDER — DEXTROSE 50 % IV SOLN
INTRAVENOUS | Status: AC
  Filled 2024-12-03: qty 50

## 2024-12-03 MED ORDER — SODIUM CHLORIDE 0.9 % IV SOLN: INTRAVENOUS | Status: DC

## 2024-12-03 MED ORDER — MIDAZOLAM HCL 2 MG/2ML IJ SOLN
INTRAMUSCULAR | Status: AC
  Filled 2024-12-03: qty 2

## 2024-12-03 MED ORDER — HYDROCODONE-ACETAMINOPHEN 5-325 MG PO TABS: 1.0000 | ORAL_TABLET | ORAL | 0 refills | Status: DC | PRN

## 2024-12-03 MED ORDER — 0.9 % SODIUM CHLORIDE (POUR BTL) OPTIME
TOPICAL | Status: DC | PRN
  Administered 2024-12-03: 15:00:00 500 mL

## 2024-12-03 MED ORDER — HYDROCODONE-ACETAMINOPHEN 5-325 MG PO TABS
1.0000 | ORAL_TABLET | ORAL | 0 refills | Status: DC | PRN
  Filled 2024-12-03: qty 20, 2d supply, fill #0

## 2024-12-03 MED ORDER — ACETAMINOPHEN 500 MG PO TABS
ORAL_TABLET | ORAL | Status: AC
  Filled 2024-12-03: qty 2

## 2024-12-03 MED ORDER — EPHEDRINE SULFATE-NACL 50-0.9 MG/10ML-% IV SOSY
PREFILLED_SYRINGE | INTRAVENOUS | Status: DC | PRN
  Administered 2024-12-03: 15:00:00 5 mg via INTRAVENOUS

## 2024-12-03 MED ORDER — INDOCYANINE GREEN 25 MG IJ SOLR
INTRAMUSCULAR | Status: AC
  Filled 2024-12-03: qty 10

## 2024-12-03 MED ORDER — FENTANYL CITRATE (PF) 100 MCG/2ML IJ SOLN
25.0000 ug | INTRAMUSCULAR | Status: DC | PRN
  Administered 2024-12-03 (×4): 25 ug via INTRAVENOUS

## 2024-12-03 MED ORDER — CELECOXIB 200 MG PO CAPS
200.0000 mg | ORAL_CAPSULE | ORAL | Status: AC
  Administered 2024-12-03: 13:00:00 200 mg via ORAL

## 2024-12-03 MED ORDER — PHENYLEPHRINE 80 MCG/ML (10ML) SYRINGE FOR IV PUSH (FOR BLOOD PRESSURE SUPPORT)
PREFILLED_SYRINGE | INTRAVENOUS | Status: DC | PRN
  Administered 2024-12-03: 15:00:00 80 ug via INTRAVENOUS

## 2024-12-03 MED ORDER — GABAPENTIN 300 MG PO CAPS
300.0000 mg | ORAL_CAPSULE | ORAL | Status: AC
  Administered 2024-12-03: 13:00:00 300 mg via ORAL

## 2024-12-03 MED ORDER — DROPERIDOL 2.5 MG/ML IJ SOLN
0.6250 mg | Freq: Once | INTRAMUSCULAR | Status: AC | PRN
  Administered 2024-12-03: 16:00:00 0.625 mg via INTRAVENOUS

## 2024-12-03 MED ORDER — FENTANYL CITRATE (PF) 100 MCG/2ML IJ SOLN
INTRAMUSCULAR | Status: DC | PRN
  Administered 2024-12-03: 14:00:00 50 ug via INTRAVENOUS
  Administered 2024-12-03: 15:00:00 25 ug via INTRAVENOUS

## 2024-12-03 MED ORDER — ROCURONIUM BROMIDE 10 MG/ML (PF) SYRINGE
PREFILLED_SYRINGE | INTRAVENOUS | Status: AC
  Filled 2024-12-03: qty 10

## 2024-12-03 MED ORDER — CHLORHEXIDINE GLUCONATE 0.12 % MT SOLN
OROMUCOSAL | Status: AC
  Filled 2024-12-03: qty 15

## 2024-12-03 MED ORDER — DEXAMETHASONE SOD PHOSPHATE PF 10 MG/ML IJ SOLN
INTRAMUSCULAR | Status: DC | PRN
  Administered 2024-12-03: 14:00:00 5 mg via INTRAVENOUS

## 2024-12-03 MED ORDER — ONDANSETRON HCL 4 MG/2ML IJ SOLN
INTRAMUSCULAR | Status: DC | PRN
  Administered 2024-12-03: 15:00:00 4 mg via INTRAVENOUS

## 2024-12-03 MED ORDER — SUGAMMADEX SODIUM 200 MG/2ML IV SOLN
INTRAVENOUS | Status: DC | PRN
  Administered 2024-12-03: 15:00:00 200 mg via INTRAVENOUS

## 2024-12-03 MED ORDER — ROCURONIUM BROMIDE 100 MG/10ML IV SOLN
INTRAVENOUS | Status: DC | PRN
  Administered 2024-12-03: 14:00:00 30 mg via INTRAVENOUS

## 2024-12-03 MED ORDER — LACTATED RINGERS IV SOLN: INTRAVENOUS | Status: DC | PRN

## 2024-12-03 MED ORDER — LIDOCAINE HCL (CARDIAC) PF 100 MG/5ML IV SOSY
PREFILLED_SYRINGE | INTRAVENOUS | Status: DC | PRN
  Administered 2024-12-03: 14:00:00 60 mg via INTRAVENOUS

## 2024-12-03 MED ORDER — ONDANSETRON HCL 4 MG/2ML IJ SOLN
INTRAMUSCULAR | Status: AC
  Filled 2024-12-03: qty 2

## 2024-12-03 MED ORDER — ORAL CARE MOUTH RINSE: 15.0000 mL | Freq: Once | OROMUCOSAL | Status: AC

## 2024-12-03 MED ORDER — CEFAZOLIN SODIUM-DEXTROSE 2-4 GM/100ML-% IV SOLN
2.0000 g | INTRAVENOUS | Status: AC
  Administered 2024-12-03: 14:00:00 2 g via INTRAVENOUS

## 2024-12-03 MED ORDER — CEFAZOLIN SODIUM-DEXTROSE 2-4 GM/100ML-% IV SOLN
INTRAVENOUS | Status: AC
  Filled 2024-12-03: qty 100

## 2024-12-03 MED ORDER — GABAPENTIN 300 MG PO CAPS
ORAL_CAPSULE | ORAL | Status: AC
  Filled 2024-12-03: qty 1

## 2024-12-03 MED ORDER — CHLORHEXIDINE GLUCONATE CLOTH 2 % EX PADS: 6.0000 | MEDICATED_PAD | Freq: Once | CUTANEOUS | Status: DC

## 2024-12-03 MED ORDER — SUCCINYLCHOLINE CHLORIDE 200 MG/10ML IV SOSY
PREFILLED_SYRINGE | INTRAVENOUS | Status: AC
  Filled 2024-12-03: qty 10

## 2024-12-03 MED ORDER — LIDOCAINE HCL (PF) 2 % IJ SOLN
INTRAMUSCULAR | Status: AC
  Filled 2024-12-03: qty 5

## 2024-12-03 MED ORDER — BUPIVACAINE-EPINEPHRINE (PF) 0.25% -1:200000 IJ SOLN
INTRAMUSCULAR | Status: AC
  Filled 2024-12-03: qty 30

## 2024-12-03 MED ORDER — CHLORHEXIDINE GLUCONATE 0.12 % MT SOLN
15.0000 mL | Freq: Once | OROMUCOSAL | Status: AC
  Administered 2024-12-03: 13:00:00 15 mL via OROMUCOSAL

## 2024-12-03 MED ORDER — BUPIVACAINE-EPINEPHRINE (PF) 0.25% -1:200000 IJ SOLN
INTRAMUSCULAR | Status: DC | PRN
  Administered 2024-12-03: 15:00:00 30 mL

## 2024-12-03 MED ORDER — ACETAMINOPHEN 500 MG PO TABS
1000.0000 mg | ORAL_TABLET | ORAL | Status: AC
  Administered 2024-12-03: 13:00:00 1000 mg via ORAL

## 2024-12-03 MED ORDER — CHLORHEXIDINE GLUCONATE CLOTH 2 % EX PADS
6.0000 | MEDICATED_PAD | Freq: Once | CUTANEOUS | Status: AC
  Administered 2024-12-03: 13:00:00 6 via TOPICAL

## 2024-12-03 MED ORDER — OXYCODONE HCL 5 MG PO TABS
ORAL_TABLET | ORAL | Status: AC
  Filled 2024-12-03: qty 1

## 2024-12-03 MED ORDER — KETAMINE HCL 50 MG/5ML IJ SOSY
PREFILLED_SYRINGE | INTRAMUSCULAR | Status: AC
  Filled 2024-12-03: qty 5

## 2024-12-03 MED ORDER — FENTANYL CITRATE (PF) 100 MCG/2ML IJ SOLN
INTRAMUSCULAR | Status: AC
  Filled 2024-12-03: qty 2

## 2024-12-03 MED ORDER — DROPERIDOL 2.5 MG/ML IJ SOLN
INTRAMUSCULAR | Status: AC
  Filled 2024-12-03: qty 2

## 2024-12-03 MED ORDER — SUCCINYLCHOLINE CHLORIDE 200 MG/10ML IV SOSY
PREFILLED_SYRINGE | INTRAVENOUS | Status: DC | PRN
  Administered 2024-12-03: 14:00:00 120 mg via INTRAVENOUS

## 2024-12-03 MED ORDER — MIDAZOLAM HCL (PF) 2 MG/2ML IJ SOLN
INTRAMUSCULAR | Status: DC | PRN
  Administered 2024-12-03: 14:00:00 2 mg via INTRAVENOUS

## 2024-12-03 MED ORDER — PROPOFOL 10 MG/ML IV BOLUS
INTRAVENOUS | Status: DC | PRN
  Administered 2024-12-03: 14:00:00 150 mg via INTRAVENOUS

## 2024-12-03 MED ORDER — CELECOXIB 200 MG PO CAPS
ORAL_CAPSULE | ORAL | Status: AC
  Filled 2024-12-03: qty 2

## 2024-12-03 MED ORDER — DEXTROSE 50 % IV SOLN
0.5000 | Freq: Once | INTRAVENOUS | Status: AC
  Administered 2024-12-03: 13:00:00 25 mL via INTRAVENOUS

## 2024-12-03 MED ORDER — PROPOFOL 10 MG/ML IV BOLUS
INTRAVENOUS | Status: AC
  Filled 2024-12-03: qty 20

## 2024-12-03 MED ORDER — INDOCYANINE GREEN 25 MG IJ SOLR
1.2500 mg | Freq: Once | INTRAMUSCULAR | Status: AC
  Administered 2024-12-03: 13:00:00 1.25 mg via INTRAVENOUS

## 2024-12-03 MED ADMIN — Oxycodone HCl Tab 5 MG: 5 mg | ORAL | @ 16:00:00 | NDC 00406055223

## 2024-12-03 MED FILL — Fentanyl Citrate Preservative Free (PF) Inj 100 MCG/2ML: INTRAMUSCULAR | Qty: 2 | Status: AC

## 2024-12-03 SURGICAL SUPPLY — 33 items
CANNULA REDUCER 12-8 DVNC XI (CANNULA) IMPLANT
CAUTERY HOOK MNPLR 1.6 DVNC XI (INSTRUMENTS) ×1 IMPLANT
CLIP LIGATING HEMO O LOK GREEN (MISCELLANEOUS) ×1 IMPLANT
DEFOGGER SCOPE WARM SEASHARP (MISCELLANEOUS) ×1 IMPLANT
DERMABOND ADVANCED .7 DNX12 (GAUZE/BANDAGES/DRESSINGS) ×1 IMPLANT
DRAPE ARM DVNC X/XI (DISPOSABLE) ×4 IMPLANT
DRAPE COLUMN DVNC XI (DISPOSABLE) ×1 IMPLANT
ELECTRODE REM PT RTRN 9FT ADLT (ELECTROSURGICAL) ×1 IMPLANT
FORCEPS BPLR R/ABLATION 8 DVNC (INSTRUMENTS) ×1 IMPLANT
FORCEPS PROGRASP DVNC XI (FORCEP) ×1 IMPLANT
GLOVE BIO SURGEON STRL SZ7 (GLOVE) ×2 IMPLANT
GOWN STRL REUS W/ TWL LRG LVL3 (GOWN DISPOSABLE) ×4 IMPLANT
IRRIGATION STRYKERFLOW (MISCELLANEOUS) IMPLANT
KIT PINK PAD W/HEAD ARM REST (MISCELLANEOUS) ×1 IMPLANT
LABEL OR SOLS (LABEL) ×1 IMPLANT
MANIFOLD NEPTUNE II (INSTRUMENTS) ×1 IMPLANT
NDL HYPO 22X1.5 SAFETY MO (MISCELLANEOUS) ×1 IMPLANT
NEEDLE HYPO 22X1.5 SAFETY MO (MISCELLANEOUS) ×1 IMPLANT
NS IRRIG 500ML POUR BTL (IV SOLUTION) ×1 IMPLANT
OBTURATOR OPTICALSTD 8 DVNC (TROCAR) ×1 IMPLANT
PACK LAP CHOLECYSTECTOMY (MISCELLANEOUS) ×1 IMPLANT
SEAL UNIV 5-12 XI (MISCELLANEOUS) ×4 IMPLANT
SET TUBE SMOKE EVAC HIGH FLOW (TUBING) ×1 IMPLANT
SOLN STERILE WATER 500 ML (IV SOLUTION) ×1 IMPLANT
SOLUTION ELECTROSURG ANTI STCK (MISCELLANEOUS) ×1 IMPLANT
SPIKE FLUID TRANSFER (MISCELLANEOUS) ×1 IMPLANT
SPONGE T-LAP 18X18 ~~LOC~~+RFID (SPONGE) ×1 IMPLANT
SPONGE T-LAP 4X18 ~~LOC~~+RFID (SPONGE) IMPLANT
SUT MNCRL AB 4-0 PS2 18 (SUTURE) ×1 IMPLANT
SUT VICRYL 0 UR6 27IN ABS (SUTURE) ×2 IMPLANT
SYR 20ML LL LF (SYRINGE) IMPLANT
SYSTEM BAG RETRIEVAL 10MM (BASKET) ×1 IMPLANT
WATER STERILE IRR 3000ML UROMA (IV SOLUTION) IMPLANT

## 2024-12-03 NOTE — Anesthesia Procedure Notes (Signed)
 Procedure Name: Intubation Date/Time: 12/03/2024 2:22 PM  Performed by: Gillermo Spruce I, CRNAPre-anesthesia Checklist: Patient identified, Emergency Drugs available, Suction available and Patient being monitored Patient Re-evaluated:Patient Re-evaluated prior to induction Oxygen Delivery Method: Circle system utilized Preoxygenation: Pre-oxygenation with 100% oxygen Induction Type: IV induction Ventilation: Mask ventilation without difficulty Laryngoscope Size: McGrath and 3 Grade View: Grade I Tube type: Oral Tube size: 7.0 mm Number of attempts: 1 Airway Equipment and Method: Stylet and Oral airway Placement Confirmation: ETT inserted through vocal cords under direct vision, positive ETCO2 and breath sounds checked- equal and bilateral Secured at: 21 cm Tube secured with: Tape Dental Injury: Teeth and Oropharynx as per pre-operative assessment

## 2024-12-03 NOTE — Transfer of Care (Signed)
 Immediate Anesthesia Transfer of Care Note  Patient: Alejandra Watkins  Procedure(s) Performed: CHOLECYSTECTOMY, ROBOT-ASSISTED, LAPAROSCOPIC (Abdomen)  Patient Location: PACU  Anesthesia Type:General  Level of Consciousness: drowsy, patient cooperative, and responds to stimulation  Airway & Oxygen Therapy: Patient Spontanous Breathing and Patient connected to face mask oxygen  Post-op Assessment: Report given to RN and Post -op Vital signs reviewed and stable  Post vital signs: stable  Last Vitals:  Vitals Value Taken Time  BP 136/76 12/03/24 15:30  Temp 36.1 C 12/03/24 15:22  Pulse 96 12/03/24 15:33  Resp 19 12/03/24 15:33  SpO2 100 % 12/03/24 15:33  Vitals shown include unfiled device data.  Last Pain:  Vitals:   12/03/24 1522  TempSrc:   PainSc: Asleep         Complications: No notable events documented.

## 2024-12-03 NOTE — Anesthesia Preprocedure Evaluation (Signed)
 Anesthesia Evaluation  Patient identified by MRN, date of birth, ID band Patient awake    Reviewed: Allergy & Precautions, H&P , NPO status , Patient's Chart, lab work & pertinent test results, reviewed documented beta blocker date and time   History of Anesthesia Complications Negative for: history of anesthetic complications  Airway Mallampati: III  TM Distance: >3 FB Neck ROM: full    Dental   Pulmonary neg pulmonary ROS   Pulmonary exam normal breath sounds clear to auscultation       Cardiovascular Exercise Tolerance: Good negative cardio ROS Normal cardiovascular exam Rhythm:regular Rate:Normal     Neuro/Psych negative neurological ROS  negative psych ROS   GI/Hepatic Neg liver ROS,GERD  Medicated and Controlled,,  Endo/Other  diabetes    Renal/GU negative Renal ROS  negative genitourinary   Musculoskeletal   Abdominal   Peds  Hematology negative hematology ROS (+)   Anesthesia Other Findings Past Medical History: No date: Acid reflux No date: Allergy No date: Anemia No date: Diabetes mellitus without complication (HCC) No date: Family history of colon cancer No date: History of chicken pox No date: Leiomyoma No date: Ovarian cyst     Comment:  seen at [facility]   Reproductive/Obstetrics negative OB ROS                              Anesthesia Physical Anesthesia Plan  ASA: 2  Anesthesia Plan: General   Post-op Pain Management:    Induction: Intravenous, Rapid sequence and Cricoid pressure planned  PONV Risk Score and Plan: 3 and Ondansetron , Dexamethasone , Midazolam  and Treatment may vary due to age or medical condition  Airway Management Planned: Oral ETT  Additional Equipment:   Intra-op Plan:   Post-operative Plan: Extubation in OR  Informed Consent: I have reviewed the patients History and Physical, chart, labs and discussed the procedure including the risks,  benefits and alternatives for the proposed anesthesia with the patient or authorized representative who has indicated his/her understanding and acceptance.     Dental Advisory Given  Plan Discussed with: Anesthesiologist, CRNA and Surgeon  Anesthesia Plan Comments:         Anesthesia Quick Evaluation

## 2024-12-03 NOTE — Op Note (Signed)
 Robotic assisted laparoscopic Cholecystectomy  Pre-operative Diagnosis: acute on chronic calculus cholecystitis  Post-operative Diagnosis: same  Procedure:  Robotic assisted laparoscopic Cholecystectomy  Surgeon: Laneta Luna, MD FACS  Anesthesia: Gen. with endotracheal tube  Findings: Chronic calculous Cholecystitis   Estimated Blood Loss: 5cc       Specimens: Gallbladder           Complications: none   Procedure Details  The patient was seen again in the Holding Room. The benefits, complications, treatment options, and expected outcomes were discussed with the patient. The risks of bleeding, infection, recurrence of symptoms, failure to resolve symptoms, bile duct damage, bile duct leak, retained common bile duct stone, bowel injury, any of which could require further surgery and/or ERCP, stent, or papillotomy were reviewed with the patient. The likelihood of improving the patient's symptoms with return to their baseline status is good.  The patient and/or family concurred with the proposed plan, giving informed consent.  The patient was taken to Operating Room, identified  and the procedure verified as Laparoscopic Cholecystectomy.  A Time Out was held and the above information confirmed.  Prior to the induction of general anesthesia, antibiotic prophylaxis was administered. VTE prophylaxis was in place. General endotracheal anesthesia was then administered and tolerated well. After the induction, the abdomen was prepped with Chloraprep and draped in the sterile fashion. The patient was positioned in the supine position.  Cut down technique was used to enter the abdominal cavity and a Hasson trochar was placed after two vicryl stitches were anchored to the fascia. Pneumoperitoneum was then created with CO2 and tolerated well without any adverse changes in the patient's vital signs.  Three 8-mm ports were placed under direct vision. All skin incisions  were infiltrated with a local  anesthetic agent before making the incision and placing the trocars.   The patient was positioned  in reverse Trendelenburg, robot was brought to the surgical field and docked in the standard fashion.  We made sure all the instrumentation was kept indirect view at all times and that there were no collision between the arms. I scrubbed out and went to the console.  The gallbladder was identified, it was distended and chronically inflammed, the fundus grasped and retracted cephalad. Adhesions were lysed bluntly. The infundibulum was grasped and retracted laterally, exposing the peritoneum overlying the triangle of Calot. This was then divided and exposed in a blunt fashion. An extended critical view of the cystic duct and cystic artery was obtained.  The cystic duct was clearly identified and bluntly dissected.   Artery and duct were double clipped and divided. Using ICG cholangiography we visualize the cystic duct and so no a Baron biliary ductal anatomy or evidence of bile injuries. The gallbladder was taken from the gallbladder fossa in a retrograde fashion with the electrocautery.  Hemostasis was achieved with the electrocautery. nspection of the right upper quadrant was performed. No bleeding, bile duct injury or leak, or bowel injury was noted. Robotic instruments and robotic arms were undocked in the standard fashion.  I scrubbed back in.  The gallbladder was removed and placed in an Endocatch bag.   Pneumoperitoneum was released.  The periumbilical port site was closed with interrumpted 0 Vicryl sutures. 4-0 subcuticular Monocryl was used to close the skin. Dermabond was  applied.  The patient was then extubated and brought to the recovery room in stable condition. Sponge, lap, and needle counts were correct at closure and at the conclusion of the case.  Laneta Luna, MD, FACS

## 2024-12-03 NOTE — Interval H&P Note (Signed)
 History and Physical Interval Note:  12/03/2024 1:46 PM  Alejandra Watkins  has presented today for surgery, with the diagnosis of cholecystitis.  The various methods of treatment have been discussed with the patient and family. After consideration of risks, benefits and other options for treatment, the patient has consented to  Procedures with comments: CHOLECYSTECTOMY, ROBOT-ASSISTED, LAPAROSCOPIC (N/A) - w/ICG as a surgical intervention.  The patient's history has been reviewed, patient examined, no change in status, stable for surgery.  I have reviewed the patient's chart and labs.  Questions were answered to the patient's satisfaction.     Ezreal Turay F Nery Frappier

## 2024-12-03 NOTE — Discharge Instructions (Addendum)

## 2024-12-04 ENCOUNTER — Ambulatory Visit: Admitting: Internal Medicine

## 2024-12-05 NOTE — Anesthesia Postprocedure Evaluation (Signed)
"   Anesthesia Post Note  Patient: LAILEE HOELZEL  Procedure(s) Performed: CHOLECYSTECTOMY, ROBOT-ASSISTED, LAPAROSCOPIC (Abdomen)  Patient location during evaluation: PACU Anesthesia Type: General Level of consciousness: awake and alert Pain management: pain level controlled Vital Signs Assessment: post-procedure vital signs reviewed and stable Respiratory status: spontaneous breathing, nonlabored ventilation, respiratory function stable and patient connected to nasal cannula oxygen Cardiovascular status: blood pressure returned to baseline and stable Postop Assessment: no apparent nausea or vomiting Anesthetic complications: no   No notable events documented.   Last Vitals:  Vitals:   12/03/24 1630 12/03/24 1647  BP: 128/86 (!) 126/92  Pulse: 78 84  Resp: 14 16  Temp: 36.7 C 36.7 C  SpO2: 95% 98%    Last Pain:  Vitals:   12/03/24 1647  TempSrc: Temporal  PainSc: 4                  Prentice Murphy      "

## 2024-12-07 ENCOUNTER — Other Ambulatory Visit: Payer: Self-pay

## 2024-12-07 LAB — SURGICAL PATHOLOGY

## 2024-12-07 MED ORDER — LEVOCETIRIZINE DIHYDROCHLORIDE 5 MG PO TABS
5.0000 mg | ORAL_TABLET | Freq: Every day | ORAL | 1 refills | Status: AC
  Filled 2024-12-07: qty 90, 90d supply, fill #0

## 2024-12-07 MED ORDER — MONTELUKAST SODIUM 10 MG PO TABS
10.0000 mg | ORAL_TABLET | Freq: Every day | ORAL | 1 refills | Status: AC
  Filled 2024-12-07: qty 90, 90d supply, fill #0

## 2024-12-23 ENCOUNTER — Encounter: Payer: Self-pay | Admitting: Physician Assistant

## 2024-12-23 ENCOUNTER — Ambulatory Visit: Admitting: Physician Assistant

## 2024-12-23 VITALS — BP 109/73 | HR 82 | Ht 64.0 in | Wt 207.0 lb

## 2024-12-23 DIAGNOSIS — Z09 Encounter for follow-up examination after completed treatment for conditions other than malignant neoplasm: Secondary | ICD-10-CM

## 2024-12-23 DIAGNOSIS — K8020 Calculus of gallbladder without cholecystitis without obstruction: Secondary | ICD-10-CM

## 2024-12-23 DIAGNOSIS — K8012 Calculus of gallbladder with acute and chronic cholecystitis without obstruction: Secondary | ICD-10-CM

## 2024-12-23 NOTE — Progress Notes (Signed)
 [facility] POST-OP OFFICE VISIT  12/23/2024  HPI: Alejandra Watkins is a 49 y.o. female 20 days s/p robotic assisted laparoscopic cholecystectomy for acute on chronic cholecystitis   She has done very well No abdominal pain, nausea, emesis She is tolerating PO; no diarrhea She did have some itching around POD10, this improved/resolved with topical hydrocortisone Ambulatory  No other complaints   Vital signs: BP 109/73   Pulse 82   Ht 5' 4 (1.626 m)   Wt 207 lb (93.9 kg)   LMP  (LMP Unknown)   SpO2 99%   BMI 35.53 kg/m    Physical Exam: Constitutional: Well appearing female, NAD Abdomen: Soft, non-tender, non-distended, no rebound/guarding Skin: Laparoscopic incisions are healing well, no erythema or drainage. She did have a Monocryl tail from umbilical incision, this was cut without issue   Assessment/Plan: This is a 49 y.o. female 20 days s/p robotic assisted laparoscopic cholecystectomy for acute on chronic cholecystitis    - Pain control prn  - Reviewed wound care recommendation  - Reviewed lifting restrictions; 4 weeks total  - Reviewed surgical pathology; CCC  - She can follow up on as needed basis; She understands to call with questions/concerns  -- Arthea Platt, PA-C [facility] 12/23/2024, 2:59 PM M-F: 7am - 4pm

## 2024-12-23 NOTE — Patient Instructions (Signed)
 GENERAL POST-OPERATIVE PATIENT INSTRUCTIONS   WOUND CARE INSTRUCTIONS:  Try to keep the wound dry and avoid ointments on the wound unless directed to do so.  If the wound becomes bright red and painful or starts to drain infected material that is not clear, please contact your physician immediately.  If the wound is mildly pink and has a thick firm ridge underneath it, this is normal, and is referred to as a healing ridge.  This will resolve over the next 4-6 weeks.  BATHING: You may shower if you have been informed of this by your surgeon. However, Please do not submerge in a tub, hot tub, or pool until incisions are completely sealed or have been told by your surgeon that you may do so.  DIET:  You may eat any foods that you can tolerate.  It is a good idea to eat a high fiber diet and take in plenty of fluids to prevent constipation.  If you do become constipated you may want to take a mild laxative or take ducolax tablets on a daily basis until your bowel habits are regular.  Constipation can be very uncomfortable, along with straining, after recent surgery.  ACTIVITY:  You are encouraged to walk and engage in light activity for the next two weeks.  You should not lift more than 20 pounds for 6 weeks total after surgery as it could put you at increased risk for complications.  Twenty pounds is roughly equivalent to a plastic bag of groceries. At that time- Listen to your body when lifting, if you have pain when lifting, stop and then try again in a few days. Soreness after doing exercises or activities of daily living is normal as you get back in to your normal routine.  MEDICATIONS:  Try to take narcotic medications and anti-inflammatory medications, such as tylenol, ibuprofen, naprosyn, etc., with food.  This will minimize stomach upset from the medication.  Should you develop nausea and vomiting from the pain medication, or develop a rash, please discontinue the medication and contact your  physician.  You should not drive, make important decisions, or operate machinery when taking narcotic pain medication.  SUNBLOCK Use sun block to incision area over the next year if this area will be exposed to sun. This helps decrease scarring and will allow you avoid a permanent darkened area over your incision.  QUESTIONS:  Please feel free to call our office if you have any questions, and we will be glad to assist you. [phone]

## 2024-12-27 MED FILL — Pantoprazole Sodium EC Tab 20 MG (Base Equiv): ORAL | 90 days supply | Qty: 90 | Fill #1 | Status: AC

## 2025-02-11 ENCOUNTER — Ambulatory Visit: Admitting: Internal Medicine

## 2025-03-08 ENCOUNTER — Ambulatory Visit: Admitting: Dermatology
